# Patient Record
Sex: Male | Born: 1935
Health system: Southern US, Community
[De-identification: ages and names within clinical notes are randomized; demographics above are authoritative.]

## PROBLEM LIST (undated history)

## (undated) DIAGNOSIS — G8929 Other chronic pain: Secondary | ICD-10-CM

## (undated) DIAGNOSIS — I4891 Unspecified atrial fibrillation: Secondary | ICD-10-CM

## (undated) DIAGNOSIS — I509 Heart failure, unspecified: Secondary | ICD-10-CM

## (undated) DIAGNOSIS — E669 Obesity, unspecified: Secondary | ICD-10-CM

## (undated) DIAGNOSIS — I10 Essential (primary) hypertension: Secondary | ICD-10-CM

## (undated) DIAGNOSIS — E785 Hyperlipidemia, unspecified: Secondary | ICD-10-CM

## (undated) DIAGNOSIS — J449 Chronic obstructive pulmonary disease, unspecified: Secondary | ICD-10-CM

## (undated) HISTORY — DX: Hyperlipidemia, unspecified: E78.5

## (undated) HISTORY — PX: PERCUTANEOUS PINNING: SHX2209

## (undated) HISTORY — DX: Essential (primary) hypertension: I10

## (undated) HISTORY — DX: Obesity, unspecified: E66.9

## (undated) HISTORY — PX: FRACTURE SURGERY: SHX138

## (undated) HISTORY — DX: Chronic obstructive pulmonary disease, unspecified: J44.9

## (undated) HISTORY — DX: Other chronic pain: G89.29

---

## 1999-12-04 ENCOUNTER — Encounter: Payer: Self-pay | Admitting: Orthopedic Surgery

## 1999-12-04 ENCOUNTER — Observation Stay (HOSPITAL_COMMUNITY): Admission: AD | Admit: 1999-12-04 | Discharge: 1999-12-05 | Payer: Self-pay | Admitting: Orthopedic Surgery

## 2004-02-25 ENCOUNTER — Ambulatory Visit (HOSPITAL_COMMUNITY): Admission: RE | Admit: 2004-02-25 | Discharge: 2004-02-25 | Payer: Self-pay | Admitting: Gastroenterology

## 2008-12-24 ENCOUNTER — Ambulatory Visit: Payer: Self-pay | Admitting: Cardiology

## 2009-01-13 ENCOUNTER — Ambulatory Visit: Payer: Self-pay

## 2009-01-13 LAB — CONVERTED CEMR LAB: TSH: 4.97 microintl units/mL (ref 0.35–5.50)

## 2009-01-15 ENCOUNTER — Ambulatory Visit: Payer: Self-pay

## 2009-01-15 ENCOUNTER — Encounter: Payer: Self-pay | Admitting: Cardiology

## 2010-01-13 ENCOUNTER — Encounter: Admission: RE | Admit: 2010-01-13 | Discharge: 2010-01-13 | Payer: Self-pay | Admitting: Family Medicine

## 2011-02-23 ENCOUNTER — Encounter: Payer: Self-pay | Admitting: Cardiovascular Disease

## 2011-02-24 ENCOUNTER — Encounter: Payer: Self-pay | Admitting: Cardiovascular Disease

## 2011-02-24 ENCOUNTER — Ambulatory Visit (INDEPENDENT_AMBULATORY_CARE_PROVIDER_SITE_OTHER): Payer: Medicare Other | Admitting: Cardiovascular Disease

## 2011-02-24 DIAGNOSIS — R0602 Shortness of breath: Secondary | ICD-10-CM

## 2011-02-24 DIAGNOSIS — R079 Chest pain, unspecified: Secondary | ICD-10-CM

## 2011-02-24 DIAGNOSIS — R0600 Dyspnea, unspecified: Secondary | ICD-10-CM

## 2011-02-24 NOTE — Assessment & Plan Note (Signed)
His dyspnea may be related to his underlying lung disease however I think a cardiac workup is necessary. Echo and stress test as above.

## 2011-02-24 NOTE — Assessment & Plan Note (Addendum)
Multiple risk factors for CAD including former tobacco abuse, HTN and hyperlipidemia with dyspnea and chest pain. He is not able to exercise on the treadmill. Will arrange [facility] and an echocardiogram.

## 2011-02-24 NOTE — Progress Notes (Signed)
History of Present Illness: 77 yo WM with history of HTN, hyperlipidemia, COPD here today for cardiac follow up. He has been seen in the past by Dr. Antoine Poche with last visit in our office February 2010 at which time he was evaluated for dyspnea and palpitations. Echo February 2010 with normal LV size and function. He also has a history of premature atrial contractions.  He is added onto my schedule today for evaluation. He tells me that his breathing has gotten worse over the last few months. He describes constant fatigue. Some dizziness but no near syncope or syncope. He also describes severe sharp stabbing pain over his left chest wall. Lasts for several minutes. Radiation into his left arm at times. No associated diaphoresis or nausea. He has a history of remote tobacco abuse but does not currently smoke.   Past Medical History  Diagnosis Date  . Hypertension     x30 years  . Hyperlipidemia     x 7-8  . COPD (chronic obstructive pulmonary disease)     Past Surgical History  Procedure Date  . Percutaneous pinning     right forearm fracture    Current Outpatient Prescriptions  Medication Sig Dispense Refill  . amLODipine (NORVASC) 5 MG tablet Take 10 mg by mouth daily.       Marland Kitchen aspirin 81 MG tablet Take 81 mg by mouth daily. occasionally      . atorvastatin (LIPITOR) 20 MG tablet Take 20 mg by mouth daily.        . budesonide-formoterol (SYMBICORT) 160-4.5 MCG/ACT inhaler Inhale 2 puffs into the lungs 2 (two) times daily.        . furosemide (LASIX) 20 MG tablet Take 20 mg by mouth daily.        Marland Kitchen LORazepam (ATIVAN) 1 MG tablet Take 1 mg by mouth every 8 (eight) hours.        . metoprolol (LOPRESSOR) 50 MG tablet Take 50 mg by mouth daily.        Marland Kitchen DISCONTD: metoprolol (TOPROL-XL) 50 MG 24 hr tablet Take 50 mg by mouth daily.          No Known Allergies  History   Social History  . Marital Status: Single    Spouse Name: N/A    Number of Children: N/A  . Years of Education: N/A     Occupational History  . Not on file.   Social History Main Topics  . Smoking status: Former Smoker -- 2.5 packs/day for 25 years    Types: Cigarettes    Quit date: 02/23/1991  . Smokeless tobacco: Not on file  . Alcohol Use:   . Drug Use:   . Sexually Active:    Other Topics Concern  . Not on file   Social History Narrative  . No narrative on file    Family History  Problem Relation Age of Onset  . Cancer Mother   . Asthma Father   . Heart attack Brother     same mother.  MI in his 23s  . Coronary artery disease Other     Review of Systems:  As stated in HPI and otherwise negative.   BP 142/70  Pulse 70  Ht 5\' 8"  (1.727 m)  Wt 270 lb (122.471 kg)  BMI 41.05 kg/m2  Physical Examination: General: Well developed, well nourished, NAD HEENT: OP clear, mucus membranes moist SKIN: warm, dry. No rashes. Neuro: No focal deficits Musculoskeletal: Muscle strength 5/5 all ext Psychiatric: Mood and affect  normal Neck: No JVD, no carotid bruits, no thyromegaly, no lymphadenopathy. Lungs:Clear bilaterally, no wheezes, rhonci, crackles Cardiovascular: Regular rate and rhythm. No murmurs, gallops or rubs. Abdomen:Soft. Bowel sounds present. Non-tender.  Extremities: No lower extremity edema. Pulses are 1 + in the bilateral DP/PT.  EKG: 02/23/11: Sinus bradycardia, rate 55 bpm.

## 2011-02-24 NOTE — Patient Instructions (Addendum)
Your physician recommends that you schedule a follow-up appointment in: 2-3 weeks with Dr. Antoine Poche Your physician has requested that you have an echocardiogram. Echocardiography is a painless test that uses sound waves to create images of your heart. It provides your doctor with information about the size and shape of your heart and how well your heart's chambers and valves are working. This procedure takes approximately one hour. There are no restrictions for this procedure.  Your physician has requested that you have a lexiscan myoview. For further information please visit [email or web address] Please follow instruction sheet, as given.

## 2011-03-03 ENCOUNTER — Ambulatory Visit (HOSPITAL_BASED_OUTPATIENT_CLINIC_OR_DEPARTMENT_OTHER): Payer: Medicare Other | Admitting: Radiology

## 2011-03-03 ENCOUNTER — Ambulatory Visit (HOSPITAL_COMMUNITY): Payer: Medicare Other | Attending: Cardiology | Admitting: Radiology

## 2011-03-03 ENCOUNTER — Other Ambulatory Visit (HOSPITAL_COMMUNITY): Payer: Self-pay | Admitting: Cardiology

## 2011-03-03 DIAGNOSIS — R0600 Dyspnea, unspecified: Secondary | ICD-10-CM

## 2011-03-03 DIAGNOSIS — R0789 Other chest pain: Secondary | ICD-10-CM

## 2011-03-03 DIAGNOSIS — R072 Precordial pain: Secondary | ICD-10-CM

## 2011-03-03 DIAGNOSIS — G809 Cerebral palsy, unspecified: Secondary | ICD-10-CM

## 2011-03-03 DIAGNOSIS — R079 Chest pain, unspecified: Secondary | ICD-10-CM | POA: Insufficient documentation

## 2011-03-03 MED ORDER — TECHNETIUM TC 99M TETROFOSMIN IV KIT
33.0000 | PACK | Freq: Once | INTRAVENOUS | Status: AC | PRN
  Administered 2011-03-03: 33 via INTRAVENOUS

## 2011-03-03 MED ORDER — REGADENOSON 0.4 MG/5ML IV SOLN
0.4000 mg | Freq: Once | INTRAVENOUS | Status: AC
  Administered 2011-03-03: 0.4 mg via INTRAVENOUS

## 2011-03-03 MED ORDER — PERFLUTREN PROTEIN A MICROSPH IV SUSP
1.3000 mL | Freq: Once | INTRAVENOUS | Status: AC
  Administered 2011-03-03: 1.3 mL via INTRAVENOUS

## 2011-03-03 MED ORDER — PERFLUTREN PROTEIN A MICROSPH IV SUSP: 1.3000 mL | Freq: Once | INTRAVENOUS | Status: DC

## 2011-03-03 MED ORDER — TECHNETIUM TC 99M TETROFOSMIN IV KIT
11.0000 | PACK | Freq: Once | INTRAVENOUS | Status: AC | PRN
  Administered 2011-03-03: 11 via INTRAVENOUS

## 2011-03-03 NOTE — Progress Notes (Addendum)
[facility] SITE 3 NUCLEAR MED [address] [place] [address] [phone]  Cardiology Nuclear Med Study  Logan West is a 77 y.o. male [id number] 03/06/1934   Nuclear Med Background Indication for Stress Test:  Evaluation for Ischemia History:  '10 Echo:EF=55% Cardiac Risk Factors: Family History - CAD, History of Smoking, Hypertension, Lipids, Obesity and TIA  Symptoms:  Chest Tightness (last date of chest discomfort was about a week ago), Dizziness, DOE, Fatigue, Palpitations, Rapid HR and SOB   Nuclear Pre-Procedure Caffeine/Decaff Intake:  None NPO After: 5 pm   Lungs:  Very faint expiratory wheezes.  O2 sat 95% on RA.  Albuterol inhaler used prior to infusion. IV 0.9% NS with Angio Cath:  22g  IV Site: R Hand  IV Started by:  Bonnita Levan, RN  Chest Size (in):  50 Cup Size: n/a  Height: 5\' 8"  (1.727 m)  Weight:  268 lb (121.564 kg)  BMI:  Body mass index is 40.75 kg/(m^2). Tech Comments:  Metoprolol held this a.m.  Symbicort used at 5:00 a.m.    Nuclear Med Study 1 or 2 day study: 1 day  Stress Test Type:  Eugenie Birks  Reading MD: Marca Ancona, MD  Order Authorizing Provider:  Rollene Rotunda, MD  Resting Radionuclide: Technetium 66m Tetrofosmin  Resting Radionuclide Dose: 11 mCi   Stress Radionuclide:  Technetium 31m Tetrofosmin  Stress Radionuclide Dose: 33 mCi           Stress Protocol Rest HR: 88 Stress HR: 99  Rest BP: 154/83 Stress BP: 144/67  Exercise Time (min): n/a METS: n/a   Predicted Max HR: 146 bpm % Max HR: 67.81 bpm Rate Pressure Product: 24401   Dose of Adenosine (mg):  n/a Dose of Lexiscan: 0.4 mg  Dose of Atropine (mg): n/a Dose of Dobutamine: n/a mcg/kg/min (at max HR)  Stress Test Technologist: Rea [employer], CMA-N  Nuclear Technologist:  Domenic Polite, CNMT     Rest Procedure:  Myocardial perfusion imaging was performed at rest 45 minutes following the intravenous administration of Technetium 36m Tetrofosmin. Rest  ECG: No acute changes, isolated PVC.  Stress Procedure:  The patient received IV Lexiscan 0.4 mg over 15-seconds.  Technetium 70m Tetrofosmin injected at 30-seconds.  There were no significant changes with Lexiscan, only occasional PVC's.  Quantitative spect images were obtained after a 45 minute delay. Stress ECG: No significant change from baseline ECG  QPS Raw Data Images:  Normal; no motion artifact; normal heart/lung ratio. Stress Images:  Decreased radiotracer uptake throughout the inferior wall. Rest Images:  Decreased radiotracer uptake throughout the inferior wall, more prominent defect than with stress. Subtraction (SDS):  Fixed inferior wall perfusion defect (actually more prominent with rest than stress). Transient Ischemic Dilatation (Normal <1.22):  0.95 Lung/Heart Ratio (Normal <0.45):  0.31  Quantitative Gated Spect Images QGS EDV:  66 ml QGS ESV:  20 ml QGS cine images:  NL LV Function; NL Wall Motion QGS EF: 70%  Impression Exercise Capacity:  Lexiscan with no exercise. BP Response:  Normal blood pressure response. Clinical Symptoms:  Short of breath, nausea. ECG Impression:  No significant ST segment change suggestive of ischemia. Comparison with Prior Nuclear Study: No previous nuclear study performed  Overall Impression:  Probably normal study.  Inferior perfusion defect worse with rest than with stress (paradoxical).  Normal inferior wall motion.  Suspect diaphragmatic attenuation.  Marca Ancona  No evidence of ischemia or infarct.  Rollene Rotunda

## 2011-03-04 ENCOUNTER — Telehealth: Payer: Self-pay | Admitting: *Deleted

## 2011-03-04 NOTE — Progress Notes (Signed)
COPY ROUTED TO DR.HOCHREIN.Falecha Clark ° °

## 2011-03-04 NOTE — Telephone Encounter (Signed)
Patient notified of normal Echo per Dr. Clifton James.

## 2011-03-04 NOTE — Telephone Encounter (Signed)
Message copied by Dessie Coma on Fri Mar 04, 2011  8:47 AM ------      Message from: Verne Carrow      Created: Thu Mar 03, 2011  2:26 PM       Normal echo. Can we let him know whitney? He is pt of Hochrein. I will forward echo to Hochrein as well. Chris            MCALHANY,CHRISTOPHER      2:26 PM

## 2011-03-07 ENCOUNTER — Encounter: Payer: Self-pay | Admitting: Cardiology

## 2011-03-15 NOTE — Progress Notes (Signed)
Pt aware of results 

## 2011-03-16 ENCOUNTER — Ambulatory Visit (INDEPENDENT_AMBULATORY_CARE_PROVIDER_SITE_OTHER): Payer: Medicare Other | Admitting: Cardiology

## 2011-03-16 ENCOUNTER — Encounter: Payer: Self-pay | Admitting: Cardiology

## 2011-03-16 VITALS — BP 144/80 | HR 68 | Ht 68.0 in | Wt 271.0 lb

## 2011-03-16 DIAGNOSIS — R0609 Other forms of dyspnea: Secondary | ICD-10-CM

## 2011-03-16 DIAGNOSIS — R0989 Other specified symptoms and signs involving the circulatory and respiratory systems: Secondary | ICD-10-CM

## 2011-03-16 DIAGNOSIS — R0600 Dyspnea, unspecified: Secondary | ICD-10-CM

## 2011-03-16 DIAGNOSIS — E669 Obesity, unspecified: Secondary | ICD-10-CM

## 2011-03-16 DIAGNOSIS — I10 Essential (primary) hypertension: Secondary | ICD-10-CM

## 2011-03-16 DIAGNOSIS — F419 Anxiety disorder, unspecified: Secondary | ICD-10-CM

## 2011-03-16 DIAGNOSIS — R079 Chest pain, unspecified: Secondary | ICD-10-CM

## 2011-03-16 LAB — BRAIN NATRIURETIC PEPTIDE: Pro B Natriuretic peptide (BNP): 24 pg/mL (ref 0.0–100.0)

## 2011-03-16 NOTE — Assessment & Plan Note (Signed)
He reports this as an issue and I referred him back to his primary provider.

## 2011-03-16 NOTE — Patient Instructions (Signed)
Have blood work drawn today See Dr. Antoine Poche as needed

## 2011-03-16 NOTE — Progress Notes (Signed)
HPI The patient presents for followup of dyspnea. He was seen recently as an add-on because of this complaint. He had an extensive workup earlier this month to include an echocardiogram which showed no ventricular dysfunction or valvular abnormalities. Stress perfusion imaging did not suggest ischemia. The is not currently getting any chest discomfort. However, he still is dyspneic with mild activity is such as walking any short distance on level ground. He is not having any PND or orthopnea. Is not having any new palpitations, presyncope or syncope. He does have fatigue. He is not clear has lung disease though I see list of COPD he is on Symbicort. He says he was given oxygen to wear at night and one he thinks they were "just trying to get his money."  He is not describing PND or orthopnea.  No Known Allergies  Current Outpatient Prescriptions  Medication Sig Dispense Refill  . amLODipine (NORVASC) 5 MG tablet Take 5 mg by mouth daily.       Marland Kitchen aspirin 81 MG tablet Take 81 mg by mouth daily. occasionally      . atorvastatin (LIPITOR) 20 MG tablet Take 20 mg by mouth daily.        . budesonide-formoterol (SYMBICORT) 160-4.5 MCG/ACT inhaler Inhale 2 puffs into the lungs 2 (two) times daily.        . furosemide (LASIX) 20 MG tablet Take 20 mg by mouth daily.        Marland Kitchen LORazepam (ATIVAN) 1 MG tablet Take 1 mg by mouth every 8 (eight) hours.        . metoprolol (LOPRESSOR) 50 MG tablet Take 50 mg by mouth daily.          Past Medical History  Diagnosis Date  . Hypertension     x30 years  . Hyperlipidemia     x 7-8  . COPD (chronic obstructive pulmonary disease)     Past Surgical History  Procedure Date  . Percutaneous pinning     right forearm fracture    ROS:  As stated in the HPI and negative for all other systems.  PHYSICAL EXAM BP 144/80  Pulse 68  Ht 5\' 8"  (1.727 m)  Wt 271 lb (122.925 kg)  BMI 41.21 kg/m2 GENERAL:  Well appearing HEENT:  Pupils equal round and reactive, fundi  not visualized, oral mucosa unremarkable NECK:  No jugular venous distention, waveform within normal limits, carotid upstroke brisk and symmetric, no bruits, no thyromegaly LYMPHATICS:  No cervical, inguinal adenopathy LUNGS:  Clear to auscultation bilaterally BACK:  No CVA tenderness CHEST:  Unremarkable HEART:  PMI not displaced or sustained,S1 and S2 within normal limits, no S3, no S4, no clicks, no rubs, no murmurs ABD:  Flat, positive bowel sounds normal in frequency in pitch, no bruits, no rebound, no guarding, no midline pulsatile mass, no hepatomegaly, no splenomegaly, obese EXT:  2 plus pulses throughout, trace edema, no cyanosis no clubbing SKIN:  No rashes no nodules NEURO:  Cranial nerves II through XII grossly intact, motor grossly intact throughout PSYCH:  Cognitively intact, oriented to person place and time   ASSESSMENT AND PLAN

## 2011-03-16 NOTE — Assessment & Plan Note (Signed)
As is the most significant problem but there does not appear to be a cardiac etiology. I will check a BNP as there is a small possibility of diastolic dysfunction contributing. However, if this is normal I would not do further cardiac testing but would refer him back to his provider for consideration of pulmonary

## 2011-03-16 NOTE — Assessment & Plan Note (Signed)
He is not describing this today. No change in therapy or further evaluation is indicated.

## 2011-03-16 NOTE — Assessment & Plan Note (Signed)
The patient understands the need to lose weight with diet and exercise. We have discussed specific strategies for this.  

## 2011-03-16 NOTE — Assessment & Plan Note (Signed)
The blood pressure continues to be high. I have instructed the patient to record a blood pressure diary and recording this. This will be presented for my review and pending these results I will make further suggestions about changes in therapy for optimal blood pressure control.  

## 2011-04-05 NOTE — Assessment & Plan Note (Signed)
[facility]                            CARDIOLOGY OFFICE NOTE   BREAKER, SPRINGER                         MRN:          [id number]  DATE:12/24/2008                            DOB:          04/04/1933    PRIMARY CARE PHYSICIAN:  Helene Kelp, [facility].   REASON FOR PRESENTATION:  Evaluate the patient with atrial arrhythmias  and dyspnea.   HISTORY OF PRESENT ILLNESS:  The patient is a pleasant 77 year old  gentleman with no prior cardiac history.  He says he did have a  screening at a church one time and had normal carotid Dopplers,  abdominal aortic aneurysm, and lower extremity Dopplers.  However, he  has not had further cardiovascular testing.  He has had long standing  dyspnea.  He does have apparent COPD on chest x-ray and abnormal  pulmonary function tests.  He has had significant shortness of breath  now just walking a short distance on level ground.  This has been slowly  progressive.  He does not describing any resting shortness of breath,  PND, or orthopnea.  He does not describe any palpitations, presyncope,  or syncope.  However, he was noted to have premature atrial contractions  frequently on an EKG.  He was referred for the above problems.   The patient is limited in his activities  Again, he gets shortness of  breath with mild activities.  He does not have any significant weight  gain recently, he has no swelling.   PAST MEDICAL HISTORY:  1. Hypertension x30 years.  2. Hyperlipidemia x7-8 years.  3. Apparent COPD.   PAST SURGICAL HISTORY:  Pinning of the right forearm fracture.   ALLERGIES:  None.   MEDICATIONS:  1. Lipitor 20 mg daily.  2. Aspirin 81 mg daily.  3. Amlodipine 5 mg daily.  4. Metoprolol 50 mg daily.  5. Furosemide 20 mg daily.  6. Serevent.   SOCIAL HISTORY:  The patient is retired.  He is married.  He has 4  children.  He quit smoking about 19 years ago after 2-1/2 packs a  day  for 25 years.   FAMILY HISTORY:  Contributory for early coronary artery disease.  He  reports that his half brother via his mother had an MI in his 27s though  he is still alive.  His father died of an asthma attack at 18.  His  mother apparently died of cancer at 44.   REVIEW OF SYSTEMS:  As stated in the HPI and is positive for cough,  joint pains.  Negative for all other systems.   PHYSICAL EXAMINATION:  GENERAL:  The patient is pleasant and in no acute  distress.  VITALS:  Blood pressure 130/74, heart rate 68 and regular, weight 274  pounds, and body mass index 41.  HEENT:  Eyes unremarkable, pupils equal, round, and reactive to light,  fundi within normal limits, oral mucosa unremarkable.  NECK:  No jugular venous distension at 45 degrees, carotid upstroke  brisk and symmetric, no bruits, no thyromegaly.  LYMPHATICS:  No cervical, axillary, or  inguinal adenopathy.  LUNGS:  Clear to auscultation bilaterally.  BACK:  No costovertebral angle tenderness.  CHEST:  Unremarkable.  HEART:  PMI not displaced or sustained, S1 and S2 within normal limits,  no S3, no S4, no clicks, no rubs, no murmurs (distant heart sounds).  ABDOMEN:  Morbidly obese, positive bowel sounds, normal in frequency and  pitch, no bruits, no rebound, no guarding, no midline pulsatile mass, no  hepatomegaly, no splenomegaly.  SKIN:  No rashes, no nodules.  EXTREMITIES:  Pulses 2+ throughout, no edema, no cyanosis, or clubbing.  NEUROLOGIC:  Oriented to person, place, and time, cranial nerves II  through XII grossly intact, motor grossly intact.   EKG; sinus rhythm, premature atrial contractions in a trigeminal  pattern, low voltage in the limb leads, no acute ST-T wave changes.   ASSESSMENT AND PLAN:  1. Dyspnea.  The patient's dyspnea is a predominant complaint.  He      does have abnormal pulmonary function tests that I have reviewed.      This may all be a primary lung problem.  His exam does not  suggest      overt heart disease, but he has very distant heart sounds and I      could miss a lot on physical exam in this gentleman.  Given his      dyspnea and the palpitations, we will get an echocardiogram.  If      this is perfectly normal then I will not suggest further testing,      but continue management of this dyspnea per his primary team.  2. Palpitations.  He is not having any symptomatic tachy palpitations.      He has premature atrial contractions.  This may be related to his      lung disease.  We will get the echo as above.  I reviewed available      records.  He has had normal electrolytes.  I do not see a recent      TSH and we will ask that this be added to his labs.  3. Chronic obstructive pulmonary disease.  Per Mr. Caryn Bee, he may      benefit from a Pulmonology appointment as they do not see that he      has had one.  4. Obesity.  He understands the need to lose weight with diet and      exercise.  5. Hypertension.  His blood pressure is well-controlled on the meds as      listed and he should      continue.  6. Follow up will be based on the results of the above.     Rollene Rotunda, MD, [facility]  Electronically Signed    JH/MedQ  DD: 12/24/2008  DT: 12/25/2008  Job #: [id number]   cc:   Helene Kelp, PA

## 2011-04-08 NOTE — Op Note (Signed)
NAME:  Logan West, Logan West                            ACCOUNT NO.:  [id number]   MEDICAL RECORD NO.:  [id number]                   PATIENT TYPE:  AMB   LOCATION:  ENDO                                 FACILITY:  [facility]   PHYSICIAN:  Anselmo Rod, M.D.               DATE OF BIRTH:  04/04/1933   DATE OF PROCEDURE:  02/25/2004  DATE OF DISCHARGE:                                 OPERATIVE REPORT   PROCEDURE PERFORMED:  Screening colonoscopy.   ENDOSCOPIST:  Anselmo Rod, M.D.   INSTRUMENT USED:  Olympus videocolonoscope.   INDICATION FOR THE PROCEDURE:  A 77 year old white male undergoing screening  colonoscopy.  The patient was also found to have guaiac-positive stools on a  routine physical in the recent past, rule out colonic polyps, masses, etc.   PREPROCEDURE PREPARATION:  Informed consent was procured from the patient.  The patient was fasted for eight hours prior to the procedure and prepped  with a bottle of magnesium citrate and a gallon of GoLYTELY the night prior  to the procedure.   PREPROCEDURE PHYSICAL:  VITAL SIGNS:  Stable.  NECK:  Supple.  CHEST:  Clear to auscultation.  S1 and S2 regular.  ABDOMEN:  Soft with normal bowel sounds.   DESCRIPTION OF THE PROCEDURE:  The patient was placed in the left lateral  decubitus position and sedated with 80 mg of Demerol and 8 mg of Versed  intravenously.  Once the patient was adequately sedated and maintained on  low-flow oxygen and continuous cardiac monitoring, the Olympus  videocolonoscope was advanced from the rectum to the cecum.  The appendiceal  orifice and ileocecal valve were visualized and photographed.  There was  some residual stool in the colon.  Multiple washings were done.  A small  anal papilla was seen on retroflexion.  No masses or polyps were identified.  The appendiceal orifice and ileocecal valve were clearly visualized and  photographed.  No masses, polyps, or diverticulosis was noted.   IMPRESSION:   Normal colonoscopy to the cecum except for a small anal papilla  seen on retroflexion.   RECOMMENDATIONS:  1. Repeat guaiac testing on an outpatient basis.  2. Repeat colorectal cancer screening the next in 10 years unless the     patient develops any abnormal symptoms in the interim.  3. Outpatient followup in the next two weeks for further recommendations.                                               Anselmo Rod, M.D.    JNM/MEDQ  D:  02/25/2004  T:  02/25/2004  Job:  [id number]   cc:   Marjory Lies, M.D.  [address]  [place]  [place] [address]  Fax: [phone]

## 2011-04-08 NOTE — Op Note (Signed)
[place]  Patient:    Logan West                          MRN: [id number] Proc. Date: 12/04/99 Adm. Date:  [id number] Attending:  Milly Jakob CC:         Harvie Junior, M.D.                           Operative Report  PREOPERATIVE DIAGNOSIS:  Severely comminuted and displaced distal radius and ulnar fracture with greater than 1 cm die punch lesion.  POSTOPERATIVE DIAGNOSIS:  Severely comminuted and displaced distal radius and ulnar fracture with greater than 1 cm die punch lesion.  OPERATION:  Open reduction and internal fixation of distal radius fracture with  cancellous allograft bone placement, K-wire fixation, and external fixation placement.  SURGEON:  Harvie Junior, M.D.  ASSISTANT:  Kerby Less, P.A.  ANESTHESIA:  General.  INDICATION:  Mr. Authement is a 77 year old gentleman with a history of having fallen at work.  He suffered a severely comminuted and displaced distal radius fracture with a severely impacted die punch lesion.  Because of this, the patient was taken to the operating room for open reduction and internal fixation.  DESCRIPTION OF PROCEDURE:  The patient was brought to the operating room. After adequate anesthesia was obtained with general anesthetic, the patient was placed supine on the operating table.  The right arm was prepped and draped in the usual sterile fashion.  Following this, an external fixation device was placed and traction was placed n the extremity.  Excellent alignment was achieved of the radial styloid although it was a greater than a 1 cm die punch lesion.  Under fluoroscopy, an incision was  made to the dorsal aspect of the wrist and a drill was used to gain access in the posterior aspect of the wrist.  A large angled curet was then advanced through the drill hole and a die punch lesion was reduced anatomically.  Cancellous bone graft, allograft was  then packed posterior to the defect, and the fluoroscopy was used to show an anatomic reduction.  K-wire was then advanced through the radial styloid to insure holding of the articular cartilage in place, and once the articular cartilage was held n place with this, additional bone graft was put in place.  The styloid was then pinned longitudinally and then the final evaluation was made under fluoroscopy.  Anatomic alignment was achieved of both the impacted portion as well as of the remaining portion of the distal radius.  At this point, the wounds were copiously irrigated and suctioned dry.  The fixator was then tightened and the K-wires were cut and the dorsal wound was closed.  The retinaculum was closed as well as the  dorsal wound.  At this point, sterile compression dressing was applied.  He was then taken to he recovery room.  He was noted to be in satisfactory condition.  ESTIMATED BLOOD LOSS:  None. DD:  12/04/99 TD:  12/04/99 Job: [id number]

## 2011-05-02 ENCOUNTER — Encounter: Payer: Self-pay | Admitting: Cardiology

## 2013-02-15 ENCOUNTER — Telehealth: Payer: Self-pay | Admitting: Physician Assistant

## 2013-02-15 NOTE — Telephone Encounter (Signed)
appt made

## 2013-02-19 ENCOUNTER — Encounter: Payer: Self-pay | Admitting: Physician Assistant

## 2013-02-19 ENCOUNTER — Ambulatory Visit (INDEPENDENT_AMBULATORY_CARE_PROVIDER_SITE_OTHER): Payer: Medicare Other | Admitting: Physician Assistant

## 2013-02-19 VITALS — BP 126/74 | HR 102 | Temp 97.4°F | Ht 68.0 in | Wt 261.8 lb

## 2013-02-19 DIAGNOSIS — F411 Generalized anxiety disorder: Secondary | ICD-10-CM

## 2013-02-19 DIAGNOSIS — I10 Essential (primary) hypertension: Secondary | ICD-10-CM

## 2013-02-19 DIAGNOSIS — M549 Dorsalgia, unspecified: Secondary | ICD-10-CM

## 2013-02-19 DIAGNOSIS — E785 Hyperlipidemia, unspecified: Secondary | ICD-10-CM

## 2013-02-19 DIAGNOSIS — J449 Chronic obstructive pulmonary disease, unspecified: Secondary | ICD-10-CM

## 2013-02-19 LAB — POCT CBC
HCT, POC: 49.8 % (ref 43.5–53.7)
MCH, POC: 29.7 pg (ref 27–31.2)
MCHC: 33.5 g/dL (ref 31.8–35.4)
MCV: 88.8 fL (ref 80–97)
POC Granulocyte: 4.6 (ref 2–6.9)
RDW, POC: 14.3 %
WBC: 6.1 10*3/uL (ref 4.6–10.2)

## 2013-02-19 LAB — COMPREHENSIVE METABOLIC PANEL
ALT: 12 U/L (ref 0–53)
Albumin: 4 g/dL (ref 3.5–5.2)
CO2: 30 mEq/L (ref 19–32)
Calcium: 9.6 mg/dL (ref 8.4–10.5)
Chloride: 100 mEq/L (ref 96–112)
Creat: 1.1 mg/dL (ref 0.50–1.35)
Potassium: 4.2 mEq/L (ref 3.5–5.3)

## 2013-02-19 MED ORDER — METOPROLOL TARTRATE 50 MG PO TABS: 50.0000 mg | ORAL_TABLET | Freq: Every day | ORAL | Status: DC

## 2013-02-19 MED ORDER — PRAVASTATIN SODIUM 40 MG PO TABS: 40.0000 mg | ORAL_TABLET | Freq: Every day | ORAL | Status: DC

## 2013-02-19 MED ORDER — BUDESONIDE-FORMOTEROL FUMARATE 160-4.5 MCG/ACT IN AERO: 2.0000 | INHALATION_SPRAY | Freq: Two times a day (BID) | RESPIRATORY_TRACT | Status: DC

## 2013-02-19 MED ORDER — LORAZEPAM 1 MG PO TABS: 1.0000 mg | ORAL_TABLET | Freq: Three times a day (TID) | ORAL | Status: DC

## 2013-02-19 MED ORDER — FUROSEMIDE 20 MG PO TABS: 20.0000 mg | ORAL_TABLET | Freq: Every day | ORAL | Status: DC

## 2013-02-19 MED ORDER — AMLODIPINE BESYLATE 10 MG PO TABS: 10.0000 mg | ORAL_TABLET | Freq: Every day | ORAL | Status: DC

## 2013-02-19 MED ORDER — TRAMADOL HCL 50 MG PO TABS: 50.0000 mg | ORAL_TABLET | Freq: Three times a day (TID) | ORAL | Status: DC | PRN

## 2013-02-20 LAB — NMR LIPOPROFILE WITH LIPIDS
Cholesterol, Total: 148 mg/dL (ref <200)
HDL Particle Number: 24.9 umol/L — ABNORMAL LOW (ref >=30.5)
LP-IR Score: 50 — ABNORMAL HIGH (ref <=45)
Large VLDL-P: 1.3 nmol/L (ref <=2.7)
Small LDL Particle Number: 771 nmol/L — ABNORMAL HIGH (ref <=527)

## 2013-02-20 NOTE — Progress Notes (Signed)
  Subjective:    Patient ID: Logan West, male    DOB: 03-16-1934, 79 y.o.   MRN: [id number]  HPI meds and labs to be updated     Review of Systems  Respiratory: Positive for cough.   Musculoskeletal: Positive for back pain.  All other systems reviewed and are negative.       Objective:   Physical Exam  Vitals reviewed. Constitutional: He is oriented to person, place, and time. He appears well-developed and well-nourished.  HENT:  Head: Normocephalic and atraumatic.  Right Ear: External ear normal.  Left Ear: External ear normal.  Nose: Nose normal.  Mouth/Throat: Oropharynx is clear and moist.  Amber colored waxy cerumen partially occluding the ear canals  Eyes: Conjunctivae and EOM are normal.  Neck: Normal range of motion. Neck supple.  Cardiovascular: Regular rhythm and normal heart sounds.   HR 102 bpm  Pulmonary/Chest: Effort normal. He has no wheezes. He has no rales.  Decreased breath sounds  Abdominal: Soft. Bowel sounds are normal. There is no tenderness. There is no rebound.  Musculoskeletal:  Paravertebral muscle spasm  Lumbar tenderness Ambulates with cane  Neurological: He is alert and oriented to person, place, and time.  Skin: Skin is warm and dry.  Psychiatric: He has a normal mood and affect. His behavior is normal. Judgment and thought content normal.          Assessment & Plan:  Other and unspecified hyperlipidemia - Plan: NMR Lipoprofile with Lipids, Comprehensive metabolic panel, POCT CBC, amLODipine (NORVASC) 10 MG tablet, budesonide-formoterol (SYMBICORT) 160-4.5 MCG/ACT inhaler, furosemide (LASIX) 20 MG tablet, LORazepam (ATIVAN) 1 MG tablet, metoprolol (LOPRESSOR) 50 MG tablet, pravastatin (PRAVACHOL) 40 MG tablet, traMADol (ULTRAM) 50 MG tablet  HTN (hypertension) - Plan: Comprehensive metabolic panel, POCT CBC, amLODipine (NORVASC) 10 MG tablet, budesonide-formoterol (SYMBICORT) 160-4.5 MCG/ACT inhaler, furosemide (LASIX) 20 MG tablet,  LORazepam (ATIVAN) 1 MG tablet, metoprolol (LOPRESSOR) 50 MG tablet, pravastatin (PRAVACHOL) 40 MG tablet, traMADol (ULTRAM) 50 MG tablet  Back pain  Anxiety state, unspecified  Chronic obstructive asthma, unspecified  Mild tachycardia - monitor and address at next office visit

## 2013-02-25 ENCOUNTER — Telehealth: Payer: Self-pay | Admitting: *Deleted

## 2013-02-25 NOTE — Telephone Encounter (Signed)
Pt will diet and exercise and continue with cholesterol meds.

## 2013-02-25 NOTE — Telephone Encounter (Signed)
Message copied by Almeta Monas on Mon Feb 25, 2013  2:49 PM ------      Message from: Horald Pollen      Created: Fri Feb 22, 2013  8:57 AM       Yes, stay on the same ------

## 2013-03-13 ENCOUNTER — Encounter: Payer: Self-pay | Admitting: *Deleted

## 2013-03-18 ENCOUNTER — Telehealth: Payer: Self-pay | Admitting: Family Medicine

## 2013-03-19 ENCOUNTER — Other Ambulatory Visit: Payer: Self-pay | Admitting: Family Medicine

## 2013-03-19 DIAGNOSIS — E785 Hyperlipidemia, unspecified: Secondary | ICD-10-CM

## 2013-03-19 DIAGNOSIS — I10 Essential (primary) hypertension: Secondary | ICD-10-CM

## 2013-03-19 MED ORDER — TRAMADOL HCL 50 MG PO TABS: 50.0000 mg | ORAL_TABLET | Freq: Three times a day (TID) | ORAL | Status: DC | PRN

## 2013-03-19 NOTE — Telephone Encounter (Signed)
Rx refilled for #90 in [facility].

## 2013-03-28 ENCOUNTER — Other Ambulatory Visit: Payer: Self-pay | Admitting: Family Medicine

## 2013-04-26 ENCOUNTER — Other Ambulatory Visit: Payer: Self-pay | Admitting: Family Medicine

## 2013-04-29 NOTE — Telephone Encounter (Signed)
LAST OV 02/19/13. LAST RF 03/28/13. PLEASE PRINT AND CALL PT IF APPROVED. THANKS.

## 2013-04-30 NOTE — Telephone Encounter (Signed)
Spoke with wife and will sch appt with pharmascist

## 2013-04-30 NOTE — Telephone Encounter (Signed)
Needs office visit for pain management review with Tammy or Marcelino Duster. Last refill until reviewed in pain management. Need to taper down and use other modalities.

## 2013-05-01 ENCOUNTER — Telehealth: Payer: Self-pay | Admitting: Pharmacist

## 2013-05-21 ENCOUNTER — Ambulatory Visit: Payer: Medicare Other | Admitting: Family Medicine

## 2013-05-21 ENCOUNTER — Ambulatory Visit (INDEPENDENT_AMBULATORY_CARE_PROVIDER_SITE_OTHER): Payer: Medicare Other | Admitting: Pharmacist Clinician (PhC)/ Clinical Pharmacy Specialist

## 2013-05-21 DIAGNOSIS — M25529 Pain in unspecified elbow: Secondary | ICD-10-CM

## 2013-05-21 NOTE — Progress Notes (Signed)
  Subjective:    Patient ID: Mccrae Speciale, male    DOB: 04/11/1934, 79 y.o.   MRN: [id number]  HPI:  12 years ago fell on ice and broke arm and wrist and then 3-4 years ago fell and broke other arm/wrist on the job.  He has had considerable pain since both injuries    Review of Systems  Constitutional: Negative.   HENT: Negative.   Eyes: Negative.   Respiratory: Negative.   Cardiovascular: Negative.   Neurological: Negative.   Psychiatric/Behavioral: Negative.        Objective:   Physical Exam  Constitutional: He is oriented to person, place, and time. He appears well-developed and well-nourished.  Cardiovascular: Normal rate and regular rhythm.   Neurological: He is alert and oriented to person, place, and time.  Psychiatric: He has a normal mood and affect. His behavior is normal. Judgment and thought content normal.          Assessment & Plan:   Subjective:    Shashank Kwasnik is a 79 y.o. male who presents for evaluation of pain. Records reviewed, and patient examined. Nature of the pain: in both arms and wrists see HPI above Location of the pain: arm/wrist Intensity: 7 out of 10 with ultram Exacerbating/relieving factors: rest and elevation Adverse effects of medications: none     Review of Systems Pertinent items are noted in HPI.    Objective:    No exam performed today, out of scope of practice. Radiographs: none    Assessment:    Assessment: Degenerative disc disease (DDD):  Marland Kitchen    Plan:     1.  Patient wants to continue with ultram:  1 tablet in the morning and 2 at bedtime.  He is able to tolerate pain with this medication.  I asked him to take and ES tylenol with the ultram to see if this boosts is pain relief and also to try ibuprofen after eating to see if it relieves his pain.  He had not trying either of these OTC analgesics.    2.  Patient signed pain management contract after I reviewed it with him.  3.  He is not having any constipation or  nausea with tramadol and no sedative properties that are effecting him.

## 2013-05-28 ENCOUNTER — Encounter: Payer: Self-pay | Admitting: Family Medicine

## 2013-05-28 ENCOUNTER — Ambulatory Visit (INDEPENDENT_AMBULATORY_CARE_PROVIDER_SITE_OTHER): Payer: Medicare Other | Admitting: Family Medicine

## 2013-05-28 VITALS — BP 105/70 | HR 68 | Temp 97.2°F | Wt 265.0 lb

## 2013-05-28 DIAGNOSIS — R0609 Other forms of dyspnea: Secondary | ICD-10-CM

## 2013-05-28 DIAGNOSIS — I10 Essential (primary) hypertension: Secondary | ICD-10-CM

## 2013-05-28 DIAGNOSIS — R0989 Other specified symptoms and signs involving the circulatory and respiratory systems: Secondary | ICD-10-CM

## 2013-05-28 DIAGNOSIS — R079 Chest pain, unspecified: Secondary | ICD-10-CM

## 2013-05-28 DIAGNOSIS — R0600 Dyspnea, unspecified: Secondary | ICD-10-CM

## 2013-05-28 DIAGNOSIS — R635 Abnormal weight gain: Secondary | ICD-10-CM

## 2013-05-28 DIAGNOSIS — F411 Generalized anxiety disorder: Secondary | ICD-10-CM

## 2013-05-28 DIAGNOSIS — G894 Chronic pain syndrome: Secondary | ICD-10-CM

## 2013-05-28 DIAGNOSIS — E669 Obesity, unspecified: Secondary | ICD-10-CM

## 2013-05-28 DIAGNOSIS — E785 Hyperlipidemia, unspecified: Secondary | ICD-10-CM | POA: Insufficient documentation

## 2013-05-28 DIAGNOSIS — F419 Anxiety disorder, unspecified: Secondary | ICD-10-CM

## 2013-05-28 LAB — COMPLETE METABOLIC PANEL WITH GFR
ALT: 12 U/L (ref 0–53)
AST: 14 U/L (ref 0–37)
Albumin: 4 g/dL (ref 3.5–5.2)
Alkaline Phosphatase: 85 U/L (ref 39–117)
BUN: 13 mg/dL (ref 6–23)
CO2: 31 mEq/L (ref 19–32)
Calcium: 9.6 mg/dL (ref 8.4–10.5)
Chloride: 101 mEq/L (ref 96–112)
Creat: 1.29 mg/dL (ref 0.50–1.35)
GFR, Est African American: 61 mL/min
GFR, Est Non African American: 53 mL/min — ABNORMAL LOW
Glucose, Bld: 88 mg/dL (ref 70–99)
Potassium: 4.9 mEq/L (ref 3.5–5.3)
Sodium: 140 mEq/L (ref 135–145)
Total Bilirubin: 1 mg/dL (ref 0.3–1.2)
Total Protein: 7.4 g/dL (ref 6.0–8.3)

## 2013-05-28 MED ORDER — METOPROLOL TARTRATE 50 MG PO TABS: 50.0000 mg | ORAL_TABLET | Freq: Every day | ORAL | Status: DC

## 2013-05-28 MED ORDER — BUDESONIDE-FORMOTEROL FUMARATE 160-4.5 MCG/ACT IN AERO: 2.0000 | INHALATION_SPRAY | Freq: Two times a day (BID) | RESPIRATORY_TRACT | Status: DC

## 2013-05-28 MED ORDER — TRAMADOL HCL 50 MG PO TABS: ORAL_TABLET | ORAL | Status: DC

## 2013-05-28 MED ORDER — LORAZEPAM 1 MG PO TABS: 1.0000 mg | ORAL_TABLET | Freq: Three times a day (TID) | ORAL | Status: DC

## 2013-05-28 MED ORDER — AMLODIPINE BESYLATE 10 MG PO TABS: 10.0000 mg | ORAL_TABLET | Freq: Every day | ORAL | Status: DC

## 2013-05-28 MED ORDER — PRAVASTATIN SODIUM 40 MG PO TABS: 40.0000 mg | ORAL_TABLET | Freq: Every day | ORAL | Status: DC

## 2013-05-28 MED ORDER — FUROSEMIDE 20 MG PO TABS: 20.0000 mg | ORAL_TABLET | Freq: Every day | ORAL | Status: DC

## 2013-05-28 NOTE — Progress Notes (Signed)
Patient ID: Logan West, male   DOB: Oct 21, 1934, 79 y.o.   MRN: [id number] SUBJECTIVE: CC: Chief Complaint  Patient presents with  . Follow-up    3 month folloqw up  fasting     HPI: Patient is here for follow up of hypertension/copd/obesitychronic pain of the right wrist and  forearm: denies Headache;deniesChest Pain;denies weakness;denies Shortness of Breath or Orthopnea;denies Visual changes;denies palpitations;denies cough;denies pedal edema;denies symptoms of TIA or stroke; admits to Compliance with medications. denies Problems with medications.  doing well.  Breakfast: grits and  Eggs Lunch: pork chops and  Steak,bread/ biscuit,green beans, mashed potatoes Supper: same as lunch   Past Medical History  Diagnosis Date  . Hypertension     x30 years  . Hyperlipidemia     x 7-8  . COPD (chronic obstructive pulmonary disease)    Past Surgical History  Procedure Laterality Date  . Percutaneous pinning      right forearm fracture   History   Social History  . Marital Status: Single    Spouse Name: N/A    Number of Children: N/A  . Years of Education: N/A   Occupational History  . Not on file.   Social History Main Topics  . Smoking status: Former Smoker -- 2.50 packs/day for 25 years    Types: Cigarettes    Quit date: 02/23/1991  . Smokeless tobacco: Not on file  . Alcohol Use:   . Drug Use:   . Sexually Active:    Other Topics Concern  . Not on file   Social History Narrative  . No narrative on file   Family History  Problem Relation Age of Onset  . Cancer Mother   . Asthma Father   . Heart attack Brother     same mother.  MI in his 76s  . Coronary artery disease Other    Current Outpatient Prescriptions on File Prior to Visit  Medication Sig Dispense Refill  . amLODipine (NORVASC) 10 MG tablet Take 1 tablet (10 mg total) by mouth daily.  30 tablet  5  . aspirin 81 MG tablet Take 81 mg by mouth daily. occasionally      . atorvastatin (LIPITOR) 20  MG tablet Take 20 mg by mouth daily.        . budesonide-formoterol (SYMBICORT) 160-4.5 MCG/ACT inhaler Inhale 2 puffs into the lungs 2 (two) times daily.  1 Inhaler  5  . furosemide (LASIX) 20 MG tablet Take 1 tablet (20 mg total) by mouth daily.  30 tablet  5  . LORazepam (ATIVAN) 1 MG tablet Take 1 tablet (1 mg total) by mouth every 8 (eight) hours.  30 tablet  5  . metoprolol (LOPRESSOR) 50 MG tablet Take 1 tablet (50 mg total) by mouth daily.  30 tablet  5  . pravastatin (PRAVACHOL) 40 MG tablet Take 1 tablet (40 mg total) by mouth daily.  30 tablet  5  . traMADol (ULTRAM) 50 MG tablet TAKE 1 TABLET EVERY 8 (EIGHT) HOURS AS NEEDED FOR PAIN ,1 IN MORNING AND 2 AT BEDTIME  90 tablet  0   No current facility-administered medications on file prior to visit.   No Known Allergies  There is no immunization history on file for this patient. Prior to Admission medications   Medication Sig Start Date End Date Taking? Authorizing Provider  amLODipine (NORVASC) 10 MG tablet Take 1 tablet (10 mg total) by mouth daily. 02/19/13  Yes Horald Pollen, PA-C  aspirin 81 MG tablet Take  81 mg by mouth daily. occasionally   Yes Historical Provider, MD  atorvastatin (LIPITOR) 20 MG tablet Take 20 mg by mouth daily.     Yes Historical Provider, MD  budesonide-formoterol (SYMBICORT) 160-4.5 MCG/ACT inhaler Inhale 2 puffs into the lungs 2 (two) times daily. 02/19/13  Yes Horald Pollen, PA-C  furosemide (LASIX) 20 MG tablet Take 1 tablet (20 mg total) by mouth daily. 02/19/13  Yes Horald Pollen, PA-C  LORazepam (ATIVAN) 1 MG tablet Take 1 tablet (1 mg total) by mouth every 8 (eight) hours. 02/19/13  Yes Horald Pollen, PA-C  metoprolol (LOPRESSOR) 50 MG tablet Take 1 tablet (50 mg total) by mouth daily. 02/19/13  Yes Horald Pollen, PA-C  pravastatin (PRAVACHOL) 40 MG tablet Take 1 tablet (40 mg total) by mouth daily. 02/19/13  Yes Horald Pollen, PA-C  traMADol (ULTRAM) 50 MG tablet TAKE 1 TABLET EVERY 8 (EIGHT) HOURS AS  NEEDED FOR PAIN ,1 IN MORNING AND 2 AT BEDTIME 04/26/13  Yes Ileana Ladd, MD     ROS: As above in the HPI. All other systems are stable or negative.  OBJECTIVE: APPEARANCE:  Patient in no acute distress.The patient appeared well nourished and normally developed. Acyanotic. Waist:51 inches VITAL SIGNS:BP 105/70  Pulse 68  Temp(Src) 97.2 F (36.2 C) (Oral)  Wt 265 lb (120.203 kg)  BMI 40.3 kg/m2 WM morbidly obese.  SKIN: warm and  Dry without overt rashes, tattoos and scars  HEAD and Neck: without JVD, Head and scalp: normal Eyes:No scleral icterus. Fundi normal, eye movements normal. Ears: Auricle normal, canal normal, Tympanic membranes normal, insufflation normal. Nose: normal Throat: normal Neck & thyroid: normal  CHEST & LUNGS: Chest wall: normal Lungs: Clear  [facility]: Reveals the PMI to be normally located. Regular rhythm, First and Second Heart sounds are normal,  absence of murmurs, rubs or gallops. Peripheral vasculature: Radial pulses: normal Dorsal pedis pulses: normal Posterior pulses: normal  ABDOMEN:  Appearance: normal Benign, no organomegaly, no masses, no Abdominal Aortic enlargement. No Guarding , no rebound. No Bruits. Bowel sounds: normal  RECTAL: N/A GU: N/A  EXTREMETIES: nonedematous. Both Femoral and Pedal pulses are normal.  MUSCULOSKELETAL:  Spine: normal Joints: intact  NEUROLOGIC: oriented to time,place and person; nonfocal. Strength is normal Sensory is normal Reflexes are normal Cranial Nerves are normal.  ASSESSMENT: Obesity  HTN (hypertension) - Plan: amLODipine (NORVASC) 10 MG tablet, furosemide (LASIX) 20 MG tablet, budesonide-formoterol (SYMBICORT) 160-4.5 MCG/ACT inhaler, metoprolol (LOPRESSOR) 50 MG tablet, pravastatin (PRAVACHOL) 40 MG tablet, LORazepam (ATIVAN) 1 MG tablet, COMPLETE METABOLIC PANEL WITH GFR  HLD (hyperlipidemia) - Plan: COMPLETE METABOLIC PANEL WITH GFR, NMR Lipoprofile with  Lipids  Dyspnea  Chest pain  Anxiety  Chronic pain syndrome - Plan: traMADol (ULTRAM) 50 MG tablet  Other and unspecified hyperlipidemia - Plan: amLODipine (NORVASC) 10 MG tablet, furosemide (LASIX) 20 MG tablet, budesonide-formoterol (SYMBICORT) 160-4.5 MCG/ACT inhaler, metoprolol (LOPRESSOR) 50 MG tablet, pravastatin (PRAVACHOL) 40 MG tablet, LORazepam (ATIVAN) 1 MG tablet  Major problem is his severe obesity. We discussed ways to reduce this  Risk factor.  PLAN: Encourage to increase vegetables and fruits in diet. He doesn't like fruits and veggies but the health benefits were discussed. His high fat diet and high animal product intake was discussed and the health impact was discussed.      Dr Woodroe Mode Recommendations  Diet and Exercise discussed with patient.  For nutrition information, I recommend books:  1).Eat to Live by Dr Monico Hoar.  2).Prevent and Reverse Heart Disease by Dr Suzzette Righter. 3) Dr Katherina Right Book:  Program to Reverse Diabetes  Exercise recommendations are:  If unable to walk, then the patient can exercise in a chair 3 times a day. By flapping arms like a bird gently and raising legs outwards to the front.  If ambulatory, the patient can go for walks for 30 minutes 3 times a week. Then increase the intensity and duration as tolerated.  Goal is to try to attain exercise frequency to 5 times a week.  If applicable: Best to perform resistance exercises (machines or weights) 2 days a week and cardio type exercises 3 days per week.  Orders Placed This Encounter  Procedures  . COMPLETE METABOLIC PANEL WITH GFR  . NMR Lipoprofile with Lipids   Meds ordered this encounter  Medications  . amLODipine (NORVASC) 10 MG tablet    Sig: Take 1 tablet (10 mg total) by mouth daily.    Dispense:  30 tablet    Refill:  11    Order Specific Question:  Supervising Provider    Answer:  Ernestina Penna [[id number]]  . furosemide (LASIX) 20 MG tablet     Sig: Take 1 tablet (20 mg total) by mouth daily.    Dispense:  30 tablet    Refill:  11    Order Specific Question:  Supervising Provider    Answer:  Ernestina Penna [[id number]]  . budesonide-formoterol (SYMBICORT) 160-4.5 MCG/ACT inhaler    Sig: Inhale 2 puffs into the lungs 2 (two) times daily.    Dispense:  1 Inhaler    Refill:  5    Order Specific Question:  Supervising Provider    Answer:  Ernestina Penna [[id number]]  . metoprolol (LOPRESSOR) 50 MG tablet    Sig: Take 1 tablet (50 mg total) by mouth daily.    Dispense:  30 tablet    Refill:  11    Order Specific Question:  Supervising Provider    Answer:  Ernestina Penna [[id number]]  . pravastatin (PRAVACHOL) 40 MG tablet    Sig: Take 1 tablet (40 mg total) by mouth daily.    Dispense:  30 tablet    Refill:  11    Order Specific Question:  Supervising Provider    Answer:  Ernestina Penna [[id number]]  . LORazepam (ATIVAN) 1 MG tablet    Sig: Take 1 tablet (1 mg total) by mouth every 8 (eight) hours.    Dispense:  30 tablet    Refill:  5    Order Specific Question:  Supervising Provider    Answer:  Ernestina Penna [[id number]]  . traMADol (ULTRAM) 50 MG tablet    Sig: Take 1 in the morning and 2 at night prn pain    Dispense:  90 tablet    Refill:  1   Return in about 4 months (around 09/28/2013) for Recheck medical problems.  Serge Main P. Modesto Charon, M.D.

## 2013-05-28 NOTE — Patient Instructions (Signed)
      Dr Almus Woodham's Recommendations  Diet and Exercise discussed with patient.  For nutrition information, I recommend books:  1).Eat to Live by Dr Joel Fuhrman. 2).Prevent and Reverse Heart Disease by Dr Caldwell Esselstyn. 3) Dr Neal Barnard's Book:  Program to Reverse Diabetes  Exercise recommendations are:  If unable to walk, then the patient can exercise in a chair 3 times a day. By flapping arms like a bird gently and raising legs outwards to the front.  If ambulatory, the patient can go for walks for 30 minutes 3 times a week. Then increase the intensity and duration as tolerated.  Goal is to try to attain exercise frequency to 5 times a week.  If applicable: Best to perform resistance exercises (machines or weights) 2 days a week and cardio type exercises 3 days per week.  

## 2013-05-29 LAB — NMR LIPOPROFILE WITH LIPIDS
Cholesterol, Total: 142 mg/dL (ref <200)
HDL Particle Number: 27.5 umol/L — ABNORMAL LOW (ref >=30.5)
HDL Size: 8.8 nm — ABNORMAL LOW (ref >=9.2)
HDL-C: 38 mg/dL — ABNORMAL LOW (ref >=40)
LDL (calc): 85 mg/dL (ref <100)
LDL Particle Number: 1106 nmol/L — ABNORMAL HIGH (ref <1000)
LDL Size: 21.1 nm (ref >20.5)
LP-IR Score: 41 (ref <=45)
Large HDL-P: 4 umol/L — ABNORMAL LOW (ref >=4.8)
Large VLDL-P: 1.7 nmol/L (ref <=2.7)
Small LDL Particle Number: 412 nmol/L (ref <=527)
Triglycerides: 93 mg/dL (ref <150)
VLDL Size: 40.1 nm (ref <=46.6)

## 2013-06-02 NOTE — Progress Notes (Signed)
Quick Note:  Lab result at goal. No change in Medications for now. No Change in plans and follow up. weight loss would improve his health ______

## 2013-07-24 ENCOUNTER — Other Ambulatory Visit: Payer: Self-pay | Admitting: Family Medicine

## 2013-07-25 NOTE — Telephone Encounter (Signed)
Last seen Dr Modesto Charon 05/28/13   If approved print and route to nurse

## 2013-07-26 NOTE — Telephone Encounter (Signed)
Pt awrwe rx tramadol ready to pick up

## 2013-07-26 NOTE — Telephone Encounter (Signed)
Last filled 06/28/13, last seen 05/28/13. Rx will print, have nurse call pt to pickup

## 2013-09-22 ENCOUNTER — Other Ambulatory Visit: Payer: Self-pay | Admitting: Family Medicine

## 2013-09-24 ENCOUNTER — Other Ambulatory Visit: Payer: Self-pay | Admitting: Family Medicine

## 2013-09-24 ENCOUNTER — Telehealth: Payer: Self-pay | Admitting: Family Medicine

## 2013-09-24 MED ORDER — TRAMADOL HCL 50 MG PO TABS: ORAL_TABLET | ORAL | Status: DC

## 2013-09-24 NOTE — Telephone Encounter (Signed)
Rx ready for pick up.was refilled earlier

## 2013-09-24 NOTE — Telephone Encounter (Signed)
PT NOTIFIED RX AT FRONT TO PICKUP.

## 2013-09-24 NOTE — Telephone Encounter (Signed)
Rx ready for pick up. 

## 2013-09-24 NOTE — Telephone Encounter (Signed)
Last seen 05/28/13  [facility]  IF approved print and route to nurse

## 2013-10-01 ENCOUNTER — Encounter: Payer: Self-pay | Admitting: Family Medicine

## 2013-10-01 ENCOUNTER — Ambulatory Visit: Payer: Medicare Other | Admitting: Family Medicine

## 2013-10-01 ENCOUNTER — Ambulatory Visit (INDEPENDENT_AMBULATORY_CARE_PROVIDER_SITE_OTHER): Payer: Medicare Other | Admitting: Family Medicine

## 2013-10-01 VITALS — BP 106/71 | HR 67 | Temp 97.4°F | Ht 68.5 in | Wt 263.6 lb

## 2013-10-01 DIAGNOSIS — R350 Frequency of micturition: Secondary | ICD-10-CM

## 2013-10-01 DIAGNOSIS — M1990 Unspecified osteoarthritis, unspecified site: Secondary | ICD-10-CM

## 2013-10-01 DIAGNOSIS — M129 Arthropathy, unspecified: Secondary | ICD-10-CM

## 2013-10-01 DIAGNOSIS — F411 Generalized anxiety disorder: Secondary | ICD-10-CM

## 2013-10-01 DIAGNOSIS — E785 Hyperlipidemia, unspecified: Secondary | ICD-10-CM

## 2013-10-01 DIAGNOSIS — I10 Essential (primary) hypertension: Secondary | ICD-10-CM

## 2013-10-01 LAB — POCT UA - MICROSCOPIC ONLY
Bacteria, U Microscopic: NEGATIVE
Casts, Ur, LPF, POC: NEGATIVE
Crystals, Ur, HPF, POC: NEGATIVE
Mucus, UA: NEGATIVE
RBC, urine, microscopic: NEGATIVE
WBC, Ur, HPF, POC: NEGATIVE
Yeast, UA: NEGATIVE

## 2013-10-01 LAB — POCT CBC
Granulocyte percent: 76.9 %G (ref 37–80)
HCT, POC: 52.1 % (ref 43.5–53.7)
Hemoglobin: 16.7 g/dL (ref 14.1–18.1)
Lymph, poc: 1 (ref 0.6–3.4)
MCH, POC: 28.8 pg (ref 27–31.2)
MCHC: 32.1 g/dL (ref 31.8–35.4)
MCV: 89.7 fL (ref 80–97)
MPV: 7.3 fL (ref 0–99.8)
POC Granulocyte: 4.2 (ref 2–6.9)
POC LYMPH PERCENT: 18.8 %L (ref 10–50)
Platelet Count, POC: 207 10*3/uL (ref 142–424)
RBC: 5.8 M/uL (ref 4.69–6.13)
RDW, POC: 14.6 %
WBC: 5.4 10*3/uL (ref 4.6–10.2)

## 2013-10-01 LAB — POCT URINALYSIS DIPSTICK
Bilirubin, UA: NEGATIVE
Blood, UA: NEGATIVE
Glucose, UA: NEGATIVE
Ketones, UA: NEGATIVE
Leukocytes, UA: NEGATIVE
Nitrite, UA: NEGATIVE
Protein, UA: NEGATIVE
Spec Grav, UA: <=1.005
Urobilinogen, UA: NEGATIVE
pH, UA: 6

## 2013-10-01 MED ORDER — LORAZEPAM 1 MG PO TABS: 1.0000 mg | ORAL_TABLET | Freq: Three times a day (TID) | ORAL | Status: DC

## 2013-10-01 MED ORDER — TRAMADOL HCL 50 MG PO TABS: ORAL_TABLET | ORAL | Status: DC

## 2013-10-01 NOTE — Progress Notes (Signed)
  Subjective:    Patient ID: Logan West, male    DOB: Oct 09, 1934, 79 y.o.   MRN: [id number]  HPI  This 79 y.o. male presents for evaluation of routine follow up.  He needs refills On his ativan.  He c/o anxiety and usually takes one in am and one in pm. He has hx of hypertension and takes lasix.  He is c/o urinary frequency.  Review of Systems C/o urinary frequency and anxiety.   No chest pain, SOB, HA, dizziness, vision change, N/V, diarrhea, constipation, dysuria, urinary urgency or frequency, myalgias, arthralgias or rash.  Objective:   Physical Exam  Vital signs noted  Well developed well nourished pbese male.  HEENT - Head atraumatic Normocephalic                Eyes - PERRLA, Conjuctiva - clear Sclera- Clear EOMI                Ears - EAC's Wnl TM's Wnl Gross Hearing WNL                Nose - Nares patent                 Throat - oropharanx wnl Respiratory - Lungs CTA bilateral Cardiac - RRR S1 and S2 without murmur GI - Abdomen soft Nontender and bowel sounds active x 4 Extremities - No edema. Neuro - Grossly intact.      Assessment & Plan:  Other and unspecified hyperlipidemia - Plan: POCT CBC, CMP14+EGFR  HTN (hypertension) - Plan: POCT CBC, CMP14+EGFR  Anxiety state, unspecified - Plan: LORazepam (ATIVAN) 1 MG tablet  Arthritis - Plan: traMADol (ULTRAM) 50 MG tablet  Urinary frequency - Plan: POCT UA - Microscopic Only, POCT urinalysis dipstick  Deatra Canter FNP

## 2013-10-01 NOTE — Progress Notes (Signed)
BP 106/71  Pulse 67  Temp(Src) 97.4 F (36.3 C) (Oral)  Ht 5' 8.5" (1.74 m)  Wt 263 lb 9.6 oz (119.568 kg)  BMI 39.49 kg/m2

## 2013-10-01 NOTE — Patient Instructions (Signed)
Hipertensión  (Hypertension)  La hipertensión es otro nombre para la presión arterial elevada. La hipertensión arterial puede significar que el músculo cardíaco está trabajando más de lo normal para bombear la sangre.  CUIDADOS EN EL HOGAR  · Es posible que sea necesario realizar cambios en su estilo de vida. Esto incluye bajar de peso y ejercitarse.  · Tome los medicamentos para la presión arterial todos los días.  · Limite el consumo de sal.  · Deje de fumar.  · No consuma drogas ilegales.  · Hable con el médico si toma descongestionantes o pastillas anticonceptivas. Estos medicamentos pueden hacer que la presión sanguínea aumente.  · Las mujeres no deben beber más de una bebida alcohólica por día. Los hombres no deben beber más de dos bebidas alcohólicas por día.  · Vea a su médico según le hayan indicado.  SOLICITE AYUDA DE INMEDIATO SI:  · Usted tiene una presión de 180 o mayor.  · Sufre una cefalea grave.  · Tiene visión borrosa o cambiante.  · Se siente confundido.  · Se siente débil, adormecido, o desfalleciente.  · Siente dolor en el pecho o en el abdomen.  · Tiene vómitos.  · No puede respirar bien.  ASEGÚRESE QUE:  · Comprende estas instrucciones.  · Controlará su enfermedad.  · Solicitará ayuda de inmediato si no mejora o empeora.  Document Released: 04/27/2010 Document Revised: 01/30/2012  Exit[employer]® Patient Information ©2014 [employer].

## 2013-10-02 LAB — CMP14+EGFR
ALT: 16 IU/L (ref 0–44)
AST: 17 IU/L (ref 0–40)
Albumin/Globulin Ratio: 1.6 (ref 1.1–2.5)
Albumin: 3.9 g/dL (ref 3.5–4.8)
Alkaline Phosphatase: 86 IU/L (ref 39–117)
BUN/Creatinine Ratio: 8 — ABNORMAL LOW (ref 10–22)
BUN: 9 mg/dL (ref 8–27)
CO2: 31 mmol/L — ABNORMAL HIGH (ref 18–29)
Calcium: 9.3 mg/dL (ref 8.6–10.2)
Chloride: 96 mmol/L — ABNORMAL LOW (ref 97–108)
Creatinine, Ser: 1.14 mg/dL (ref 0.76–1.27)
GFR calc Af Amer: 71 mL/min/{1.73_m2} (ref >59)
GFR calc non Af Amer: 62 mL/min/{1.73_m2} (ref >59)
Globulin, Total: 2.5 g/dL (ref 1.5–4.5)
Glucose: 101 mg/dL — ABNORMAL HIGH (ref 65–99)
Potassium: 4.2 mmol/L (ref 3.5–5.2)
Sodium: 140 mmol/L (ref 134–144)
Total Bilirubin: 0.9 mg/dL (ref 0.0–1.2)
Total Protein: 6.4 g/dL (ref 6.0–8.5)

## 2014-01-18 ENCOUNTER — Other Ambulatory Visit: Payer: Self-pay | Admitting: Family Medicine

## 2014-01-21 NOTE — Telephone Encounter (Signed)
[facility] 10[facility]01/13  [facility]  If [facility]

## 2014-02-17 ENCOUNTER — Other Ambulatory Visit: Payer: Self-pay | Admitting: Family Medicine

## 2014-02-17 NOTE — Telephone Encounter (Signed)
Patient has appt on May 11th for follow-up. He was told to follow up in 6 months from last visit but was only given 4 months of refills.

## 2014-03-31 ENCOUNTER — Ambulatory Visit (INDEPENDENT_AMBULATORY_CARE_PROVIDER_SITE_OTHER): Payer: Medicare Other

## 2014-03-31 ENCOUNTER — Ambulatory Visit (INDEPENDENT_AMBULATORY_CARE_PROVIDER_SITE_OTHER): Payer: Medicare Other | Admitting: Family Medicine

## 2014-03-31 ENCOUNTER — Encounter: Payer: Self-pay | Admitting: Family Medicine

## 2014-03-31 VITALS — BP 129/76 | HR 78 | Temp 97.3°F | Ht 68.5 in | Wt 252.8 lb

## 2014-03-31 DIAGNOSIS — R5381 Other malaise: Secondary | ICD-10-CM

## 2014-03-31 DIAGNOSIS — R5383 Other fatigue: Secondary | ICD-10-CM

## 2014-03-31 DIAGNOSIS — E785 Hyperlipidemia, unspecified: Secondary | ICD-10-CM

## 2014-03-31 DIAGNOSIS — I10 Essential (primary) hypertension: Secondary | ICD-10-CM

## 2014-03-31 DIAGNOSIS — R0602 Shortness of breath: Secondary | ICD-10-CM

## 2014-03-31 DIAGNOSIS — Z125 Encounter for screening for malignant neoplasm of prostate: Secondary | ICD-10-CM

## 2014-03-31 LAB — POCT CBC
Granulocyte percent: 74.2 %G (ref 37–80)
HCT, POC: 51.7 % (ref 43.5–53.7)
Hemoglobin: 16.3 g/dL (ref 14.1–18.1)
Lymph, poc: 1.2 (ref 0.6–3.4)
MCH, POC: 28.7 pg (ref 27–31.2)
MCHC: 31.6 g/dL — AB (ref 31.8–35.4)
MCV: 90.8 fL (ref 80–97)
MPV: 8.6 fL (ref 0–99.8)
POC Granulocyte: 4 (ref 2–6.9)
POC LYMPH PERCENT: 22.3 %L (ref 10–50)
Platelet Count, POC: 183 10*3/uL (ref 142–424)
RBC: 5.7 M/uL (ref 4.69–6.13)
RDW, POC: 14.5 %
WBC: 5.4 10*3/uL (ref 4.6–10.2)

## 2014-03-31 NOTE — Progress Notes (Signed)
Subjective:    Patient ID: Logan West, male    DOB: 15-Jan-1934, 80 y.o.   MRN: [id number]  HPI This 80 y.o. male presents for evaluation of routine follow up.  Patient has hx of hypertension, hyperlipidemia, chronic pain, and anxiety.  He has no acute problems.  He is due for labs. He has hx of copd and obesity and he c/o doe which is chronic.  He denies fever or mucopurulent productive cough.   Review of Systems C/o Sob   No chest pain, SOB, HA, dizziness, vision change, N/V, diarrhea, constipation, dysuria, urinary urgency or frequency, myalgias, arthralgias or rash.  Objective:   Physical Exam  Vital signs noted  Well developed well nourished male.  HEENT - Head atraumatic Normocephalic                Eyes - PERRLA, Conjuctiva - clear Sclera- Clear EOMI                Ears - EAC's Wnl TM's Wnl Gross Hearing WNL                Throat - oropharanx wnl Respiratory - Lungs CTA bilateral Cardiac - RRR S1 and S2 without murmur GI - Abdomen soft Nontender and bowel sounds active x 4 Extremities - No edema. Neuro - Grossly intact.     cxr - no infiltrate Prelimnary reading by Iverson Alamin Assessment & Plan:  Fatigue - Plan: POCT CBC, Vit D  25 hydroxy (rtn osteoporosis monitoring), Thyroid Panel With TSH  Hypertension - Plan: CMP14+EGFR  Hyperlipemia - Plan: Lipid panel  Screening for prostate cancer - Plan: PSA, total and free  SOB (shortness of breath) - Plan: DG Chest 2 View  Probably due to copd an deconditioning  Lysbeth Penner FNP

## 2014-04-03 LAB — LIPID PANEL
Chol/HDL Ratio: 3.5 ratio units (ref 0.0–5.0)
Cholesterol, Total: 146 mg/dL (ref 100–199)
HDL: 42 mg/dL (ref >39)
LDL Calculated: 88 mg/dL (ref 0–99)
Triglycerides: 82 mg/dL (ref 0–149)
VLDL Cholesterol Cal: 16 mg/dL (ref 5–40)

## 2014-04-03 LAB — CMP14+EGFR
ALT: 11 IU/L (ref 0–44)
AST: 15 IU/L (ref 0–40)
Albumin/Globulin Ratio: 1.4 (ref 1.1–2.5)
Albumin: 3.8 g/dL (ref 3.5–4.8)
Alkaline Phosphatase: 90 IU/L (ref 39–117)
BUN/Creatinine Ratio: 12 (ref 10–22)
BUN: 13 mg/dL (ref 8–27)
CO2: 23 mmol/L (ref 18–29)
Calcium: 9.4 mg/dL (ref 8.6–10.2)
Chloride: 100 mmol/L (ref 97–108)
Creatinine, Ser: 1.06 mg/dL (ref 0.76–1.27)
GFR calc Af Amer: 78 mL/min/{1.73_m2} (ref >59)
GFR calc non Af Amer: 67 mL/min/{1.73_m2} (ref >59)
Globulin, Total: 2.7 g/dL (ref 1.5–4.5)
Glucose: 108 mg/dL — ABNORMAL HIGH (ref 65–99)
Potassium: 4.1 mmol/L (ref 3.5–5.2)
Sodium: 144 mmol/L (ref 134–144)
Total Bilirubin: 0.5 mg/dL (ref 0.0–1.2)
Total Protein: 6.5 g/dL (ref 6.0–8.5)

## 2014-04-03 LAB — THYROID PANEL WITH TSH
Free Thyroxine Index: 2.3 (ref 1.2–4.9)
T3 Uptake Ratio: 28 % (ref 24–39)
T4, Total: 8.1 ug/dL (ref 4.5–12.0)
TSH: 3.2 u[IU]/mL (ref 0.450–4.500)

## 2014-04-03 LAB — PSA, TOTAL AND FREE
PSA, Free Pct: 26.8 %
PSA, Free: 0.51 ng/mL
PSA: 1.9 ng/mL (ref 0.0–4.0)

## 2014-04-03 LAB — VITAMIN D 25 HYDROXY (VIT D DEFICIENCY, FRACTURES): Vit D, 25-Hydroxy: 13.6 ng/mL — ABNORMAL LOW (ref 30.0–100.0)

## 2014-04-04 ENCOUNTER — Other Ambulatory Visit: Payer: Self-pay | Admitting: Family Medicine

## 2014-04-04 MED ORDER — VITAMIN D (ERGOCALCIFEROL) 1.25 MG (50000 UNIT) PO CAPS: 50000.0000 [IU] | ORAL_CAPSULE | ORAL | Status: DC

## 2014-05-27 ENCOUNTER — Other Ambulatory Visit: Payer: Self-pay | Admitting: *Deleted

## 2014-05-27 DIAGNOSIS — E785 Hyperlipidemia, unspecified: Secondary | ICD-10-CM

## 2014-05-27 MED ORDER — FUROSEMIDE 20 MG PO TABS
20.0000 mg | ORAL_TABLET | Freq: Every day | ORAL | Status: DC
Start: 2014-05-27 — End: 2014-10-02

## 2014-06-12 ENCOUNTER — Other Ambulatory Visit: Payer: Self-pay | Admitting: *Deleted

## 2014-06-12 DIAGNOSIS — I10 Essential (primary) hypertension: Secondary | ICD-10-CM

## 2014-06-12 DIAGNOSIS — E785 Hyperlipidemia, unspecified: Secondary | ICD-10-CM

## 2014-06-12 MED ORDER — METOPROLOL TARTRATE 50 MG PO TABS: 50.0000 mg | ORAL_TABLET | Freq: Every day | ORAL | Status: DC

## 2014-06-14 ENCOUNTER — Other Ambulatory Visit: Payer: Self-pay | Admitting: Family Medicine

## 2014-06-16 NOTE — Telephone Encounter (Signed)
Last seen 03/31/14  [facility]  Print and route to nurse

## 2014-06-24 ENCOUNTER — Other Ambulatory Visit: Payer: Self-pay | Admitting: *Deleted

## 2014-06-24 DIAGNOSIS — E785 Hyperlipidemia, unspecified: Secondary | ICD-10-CM

## 2014-06-24 MED ORDER — PRAVASTATIN SODIUM 40 MG PO TABS: 40.0000 mg | ORAL_TABLET | Freq: Every day | ORAL | Status: DC

## 2014-06-24 MED ORDER — AMLODIPINE BESYLATE 10 MG PO TABS: 10.0000 mg | ORAL_TABLET | Freq: Every day | ORAL | Status: DC

## 2014-07-13 ENCOUNTER — Other Ambulatory Visit: Payer: Self-pay | Admitting: Family Medicine

## 2014-07-16 NOTE — Telephone Encounter (Signed)
Last filled 06/18/14, last seen 03/31/14

## 2014-07-17 ENCOUNTER — Other Ambulatory Visit: Payer: Self-pay | Admitting: Family Medicine

## 2014-07-17 ENCOUNTER — Telehealth: Payer: Self-pay | Admitting: Family Medicine

## 2014-07-17 MED ORDER — TRAMADOL HCL 50 MG PO TABS: 50.0000 mg | ORAL_TABLET | Freq: Four times a day (QID) | ORAL | Status: DC | PRN

## 2014-07-17 NOTE — Telephone Encounter (Signed)
RX printed and to the front for pt pick up 

## 2014-08-12 ENCOUNTER — Other Ambulatory Visit: Payer: Self-pay | Admitting: Family Medicine

## 2014-08-13 NOTE — Telephone Encounter (Signed)
Last seen 03/31/14  [facility]  If approved route to nurse [facility]

## 2014-08-14 ENCOUNTER — Other Ambulatory Visit: Payer: Self-pay | Admitting: Family Medicine

## 2014-08-21 ENCOUNTER — Other Ambulatory Visit: Payer: Self-pay | Admitting: Family Medicine

## 2014-09-11 ENCOUNTER — Ambulatory Visit (INDEPENDENT_AMBULATORY_CARE_PROVIDER_SITE_OTHER): Payer: Medicare Other

## 2014-09-11 DIAGNOSIS — Z23 Encounter for immunization: Secondary | ICD-10-CM

## 2014-09-17 ENCOUNTER — Other Ambulatory Visit: Payer: Self-pay | Admitting: Family Medicine

## 2014-09-18 NOTE — Telephone Encounter (Signed)
Last seen 03/31/14  [facility]  If approved route to nurse to call  [facility]

## 2014-09-19 NOTE — Telephone Encounter (Signed)
Please review

## 2014-09-22 ENCOUNTER — Other Ambulatory Visit: Payer: Self-pay | Admitting: Family Medicine

## 2014-09-23 ENCOUNTER — Telehealth: Payer: Self-pay | Admitting: Family Medicine

## 2014-09-23 NOTE — Telephone Encounter (Signed)
Aware, [facility], vm.

## 2014-09-23 NOTE — Telephone Encounter (Signed)
[facility]. Pt notified.

## 2014-10-02 ENCOUNTER — Ambulatory Visit (INDEPENDENT_AMBULATORY_CARE_PROVIDER_SITE_OTHER): Payer: Medicare Other | Admitting: Family Medicine

## 2014-10-02 VITALS — BP 126/73 | HR 74 | Temp 97.4°F | Wt 254.6 lb

## 2014-10-02 DIAGNOSIS — E785 Hyperlipidemia, unspecified: Secondary | ICD-10-CM

## 2014-10-02 DIAGNOSIS — I10 Essential (primary) hypertension: Secondary | ICD-10-CM

## 2014-10-02 LAB — POCT CBC
Granulocyte percent: 74.8 %G (ref 37–80)
HCT, POC: 50.5 % (ref 43.5–53.7)
Hemoglobin: 16.6 g/dL (ref 14.1–18.1)
Lymph, poc: 1.2 (ref 0.6–3.4)
MCH, POC: 29.6 pg (ref 27–31.2)
MCHC: 32.9 g/dL (ref 31.8–35.4)
MCV: 90 fL (ref 80–97)
MPV: 7.5 fL (ref 0–99.8)
POC Granulocyte: 4.1 (ref 2–6.9)
POC LYMPH PERCENT: 21.1 %L (ref 10–50)
Platelet Count, POC: 211 10*3/uL (ref 142–424)
RBC: 5.6 M/uL (ref 4.69–6.13)
RDW, POC: 14 %
WBC: 5.5 10*3/uL (ref 4.6–10.2)

## 2014-10-02 MED ORDER — METOPROLOL TARTRATE 50 MG PO TABS: 50.0000 mg | ORAL_TABLET | Freq: Every day | ORAL | Status: DC

## 2014-10-02 MED ORDER — FUROSEMIDE 20 MG PO TABS: 20.0000 mg | ORAL_TABLET | Freq: Every day | ORAL | Status: DC

## 2014-10-02 MED ORDER — TRAMADOL HCL 50 MG PO TABS: 50.0000 mg | ORAL_TABLET | Freq: Four times a day (QID) | ORAL | Status: DC | PRN

## 2014-10-02 NOTE — Progress Notes (Signed)
   Subjective:    Patient ID: Logan West, male    DOB: October 26, 1934, 80 y.o.   MRN: [id number]  HPI Patient is here for annual exam.  He is here for refills.  He is out of his tramadol for pain.    He states he has plenty of nerve pills. He recently had refills on his ativan.  Review of Systems  Constitutional: Negative for fever.  HENT: Negative for ear pain.   Eyes: Negative for discharge.  Respiratory: Negative for cough.   Cardiovascular: Negative for chest pain.  Gastrointestinal: Negative for abdominal distention.  Endocrine: Negative for polyuria.  Genitourinary: Negative for difficulty urinating.  Musculoskeletal: Negative for gait problem and neck pain.  Skin: Negative for color change and rash.  Neurological: Negative for speech difficulty and headaches.  Psychiatric/Behavioral: Negative for agitation.       Objective:    BP 126/73 mmHg  Pulse 74  Temp(Src) 97.4 F (36.3 C) (Oral)  Wt 254 lb 9.6 oz (115.486 kg) Physical Exam  Constitutional: He is oriented to person, place, and time. He appears well-developed and well-nourished.  HENT:  Head: Normocephalic and atraumatic.  Mouth/Throat: Oropharynx is clear and moist.  Eyes: Pupils are equal, round, and reactive to light.  Neck: Normal range of motion. Neck supple.  Cardiovascular: Normal rate and regular rhythm.   No murmur heard. Pulmonary/Chest: Effort normal and breath sounds normal.  Abdominal: Soft. Bowel sounds are normal. There is no tenderness.  Neurological: He is alert and oriented to person, place, and time.  Skin: Skin is warm and dry.  Psychiatric: He has a normal mood and affect.          Assessment & Plan:     ICD-9-CM ICD-10-CM   1. Essential hypertension, benign 401.1 I10 POCT CBC     CMP14+EGFR  2. Hyperlipemia 272.4 E78.5 Lipid panel  3. Essential hypertension 401.9 I10 metoprolol (LOPRESSOR) 50 MG tablet     furosemide (LASIX) 20 MG tablet     No Follow-up on file.  Lysbeth Penner FNP

## 2014-10-03 LAB — CMP14+EGFR
ALT: 11 IU/L (ref 0–44)
AST: 17 IU/L (ref 0–40)
Albumin/Globulin Ratio: 1.5 (ref 1.1–2.5)
Albumin: 4.1 g/dL (ref 3.5–4.8)
Alkaline Phosphatase: 92 IU/L (ref 39–117)
BUN/Creatinine Ratio: 10 (ref 10–22)
BUN: 13 mg/dL (ref 8–27)
CO2: 28 mmol/L (ref 18–29)
Calcium: 9.5 mg/dL (ref 8.6–10.2)
Chloride: 98 mmol/L (ref 97–108)
Creatinine, Ser: 1.28 mg/dL — ABNORMAL HIGH (ref 0.76–1.27)
GFR calc Af Amer: 62 mL/min/{1.73_m2} (ref >59)
GFR calc non Af Amer: 53 mL/min/{1.73_m2} — ABNORMAL LOW (ref >59)
Globulin, Total: 2.8 g/dL (ref 1.5–4.5)
Glucose: 104 mg/dL — ABNORMAL HIGH (ref 65–99)
Potassium: 4.2 mmol/L (ref 3.5–5.2)
Sodium: 142 mmol/L (ref 134–144)
Total Bilirubin: 0.9 mg/dL (ref 0.0–1.2)
Total Protein: 6.9 g/dL (ref 6.0–8.5)

## 2014-10-03 LAB — LIPID PANEL
Chol/HDL Ratio: 3.4 ratio units (ref 0.0–5.0)
Cholesterol, Total: 146 mg/dL (ref 100–199)
HDL: 43 mg/dL (ref >39)
LDL Calculated: 86 mg/dL (ref 0–99)
Triglycerides: 86 mg/dL (ref 0–149)
VLDL Cholesterol Cal: 17 mg/dL (ref 5–40)

## 2014-10-14 ENCOUNTER — Other Ambulatory Visit: Payer: Self-pay | Admitting: Family Medicine

## 2014-10-28 ENCOUNTER — Other Ambulatory Visit: Payer: Self-pay | Admitting: Family Medicine

## 2014-11-17 ENCOUNTER — Other Ambulatory Visit: Payer: Self-pay | Admitting: Family Medicine

## 2014-11-18 NOTE — Telephone Encounter (Signed)
Last seen 10/02/14, last filled 10/20/14. [facility]

## 2014-11-18 NOTE — Telephone Encounter (Signed)
Please review and advise.

## 2014-11-22 ENCOUNTER — Other Ambulatory Visit: Payer: Self-pay | Admitting: Family Medicine

## 2014-11-24 ENCOUNTER — Telehealth: Payer: Self-pay | Admitting: *Deleted

## 2014-11-24 NOTE — Telephone Encounter (Signed)
Called in refill for Lorazepam okayed per [employer]

## 2014-12-02 DIAGNOSIS — J449 Chronic obstructive pulmonary disease, unspecified: Secondary | ICD-10-CM | POA: Diagnosis not present

## 2014-12-13 ENCOUNTER — Other Ambulatory Visit: Payer: Self-pay | Admitting: Family Medicine

## 2014-12-18 DIAGNOSIS — J449 Chronic obstructive pulmonary disease, unspecified: Secondary | ICD-10-CM | POA: Diagnosis not present

## 2015-01-02 DIAGNOSIS — J449 Chronic obstructive pulmonary disease, unspecified: Secondary | ICD-10-CM | POA: Diagnosis not present

## 2015-01-10 ENCOUNTER — Other Ambulatory Visit: Payer: Self-pay | Admitting: Family Medicine

## 2015-01-12 NOTE — Telephone Encounter (Signed)
Please call in ativan with 1 refills 

## 2015-01-12 NOTE — Telephone Encounter (Signed)
[facility] 

## 2015-01-18 DIAGNOSIS — J449 Chronic obstructive pulmonary disease, unspecified: Secondary | ICD-10-CM | POA: Diagnosis not present

## 2015-01-29 ENCOUNTER — Other Ambulatory Visit: Payer: Self-pay | Admitting: Family Medicine

## 2015-01-31 DIAGNOSIS — J449 Chronic obstructive pulmonary disease, unspecified: Secondary | ICD-10-CM | POA: Diagnosis not present

## 2015-02-16 DIAGNOSIS — J449 Chronic obstructive pulmonary disease, unspecified: Secondary | ICD-10-CM | POA: Diagnosis not present

## 2015-03-03 DIAGNOSIS — J449 Chronic obstructive pulmonary disease, unspecified: Secondary | ICD-10-CM | POA: Diagnosis not present

## 2015-03-11 ENCOUNTER — Other Ambulatory Visit: Payer: Self-pay | Admitting: Family Medicine

## 2015-03-12 ENCOUNTER — Telehealth: Payer: Self-pay | Admitting: *Deleted

## 2015-03-12 NOTE — Telephone Encounter (Signed)
Patient aware,Tramadol script .

## 2015-03-12 NOTE — Telephone Encounter (Signed)
Last seen 10/02/14 B  [place]  If approved print

## 2015-03-13 ENCOUNTER — Other Ambulatory Visit: Payer: Self-pay | Admitting: Nurse Practitioner

## 2015-03-16 ENCOUNTER — Telehealth: Payer: Self-pay | Admitting: Physician Assistant

## 2015-03-16 NOTE — Telephone Encounter (Signed)
Does not need tramadol, needs lorazepam. Call into

## 2015-03-17 MED ORDER — LORAZEPAM 1 MG PO TABS: 1.0000 mg | ORAL_TABLET | Freq: Three times a day (TID) | ORAL | Status: DC | PRN

## 2015-03-17 NOTE — Telephone Encounter (Signed)
Please call in lorazepam 1 mg BID #60  with 0 refills

## 2015-03-17 NOTE — Telephone Encounter (Signed)
Pt is completely out of his lorazepam and has scheduled an appt 4/29 with Lynwood Dawleyiffany Gann, ok to give rx for lorazepam 1mg ? Please advise.

## 2015-03-17 NOTE — Telephone Encounter (Signed)
Refill Ativan lmovm at [facility]

## 2015-03-19 DIAGNOSIS — J449 Chronic obstructive pulmonary disease, unspecified: Secondary | ICD-10-CM | POA: Diagnosis not present

## 2015-03-20 ENCOUNTER — Ambulatory Visit (INDEPENDENT_AMBULATORY_CARE_PROVIDER_SITE_OTHER): Payer: Medicare Other | Admitting: Physician Assistant

## 2015-03-20 ENCOUNTER — Encounter: Payer: Self-pay | Admitting: Physician Assistant

## 2015-03-20 VITALS — BP 98/55 | HR 91 | Temp 97.3°F | Ht 68.5 in | Wt 254.0 lb

## 2015-03-20 DIAGNOSIS — E785 Hyperlipidemia, unspecified: Secondary | ICD-10-CM | POA: Diagnosis not present

## 2015-03-20 DIAGNOSIS — F411 Generalized anxiety disorder: Secondary | ICD-10-CM

## 2015-03-20 DIAGNOSIS — M1990 Unspecified osteoarthritis, unspecified site: Secondary | ICD-10-CM | POA: Diagnosis not present

## 2015-03-20 DIAGNOSIS — I10 Essential (primary) hypertension: Secondary | ICD-10-CM | POA: Diagnosis not present

## 2015-03-20 LAB — POCT CBC
Granulocyte percent: 67.7 %G (ref 37–80)
HCT, POC: 52.2 % (ref 43.5–53.7)
Hemoglobin: 15.9 g/dL (ref 14.1–18.1)
Lymph, poc: 1.5 (ref 0.6–3.4)
MCH, POC: 28 pg (ref 27–31.2)
MCHC: 30.5 g/dL — AB (ref 31.8–35.4)
MCV: 91.7 fL (ref 80–97)
MPV: 7.3 fL (ref 0–99.8)
PLATELET COUNT, POC: 247 10*3/uL (ref 142–424)
POC GRANULOCYTE: 4.1 (ref 2–6.9)
POC LYMPH PERCENT: 24.1 %L (ref 10–50)
RBC: 5.69 M/uL (ref 4.69–6.13)
RDW, POC: 14.7 %
WBC: 6.1 10*3/uL (ref 4.6–10.2)

## 2015-03-20 MED ORDER — PRAVASTATIN SODIUM 40 MG PO TABS: ORAL_TABLET | ORAL | Status: DC

## 2015-03-20 MED ORDER — LORAZEPAM 1 MG PO TABS: 1.0000 mg | ORAL_TABLET | Freq: Three times a day (TID) | ORAL | Status: DC | PRN

## 2015-03-20 MED ORDER — AMLODIPINE BESYLATE 10 MG PO TABS: ORAL_TABLET | ORAL | Status: DC

## 2015-03-20 MED ORDER — FUROSEMIDE 20 MG PO TABS: ORAL_TABLET | ORAL | Status: DC

## 2015-03-20 MED ORDER — TRAMADOL HCL 50 MG PO TABS: 50.0000 mg | ORAL_TABLET | Freq: Two times a day (BID) | ORAL | Status: DC | PRN

## 2015-03-20 MED ORDER — METOPROLOL TARTRATE 50 MG PO TABS: ORAL_TABLET | ORAL | Status: DC

## 2015-03-20 NOTE — Progress Notes (Signed)
   Subjective:    Patient ID: Logan West, male    DOB: Jan 30, 1934, 81 y.o.   MRN: [id number]  HPI 81 y/o male presents for refill of medication. Last labs were 09/2014. No new complaints today besides his chronic conditions of hyperlipidemia, HTN, COPD, arthritis and anxiety.     Review of Systems  Respiratory: Positive for shortness of breath (chronic ).   Musculoskeletal: Positive for arthralgias (chronic).  Psychiatric/Behavioral: The patient is nervous/anxious.   All other systems reviewed and are negative.      Objective:   Physical Exam  Constitutional: He is oriented to person, place, and time. He appears well-developed and well-nourished.  Obese    HENT:  Head: Normocephalic and atraumatic.  Cardiovascular: Normal rate, regular rhythm, normal heart sounds and intact distal pulses.  Exam reveals no gallop and no friction rub.   No murmur heard. Pulmonary/Chest: Effort normal. He has wheezes (slight expiratory bilateral ). He has no rales. He exhibits no tenderness.  Musculoskeletal: Normal range of motion.  Neurological: He is alert and oriented to person, place, and time.  Psychiatric: He has a normal mood and affect. His behavior is normal. Judgment and thought content normal.  Nursing note and vitals reviewed.         Assessment & Plan:  1. Essential hypertension, benign  - POCT CBC - CMP14+EGFR - amLODipine (NORVASC) 10 MG tablet; TAKE 1 TABLET (10 MG TOTAL) BY MOUTH DAILY.  Dispense: 30 tablet; Refill: 5 - furosemide (LASIX) 20 MG tablet; TAKE 1 TABLET (20 MG TOTAL) BY MOUTH DAILY.  Dispense: 30 tablet; Refill: 5 - metoprolol (LOPRESSOR) 50 MG tablet; TAKE 1 TABLET (50 MG TOTAL) BY MOUTH DAILY.  Dispense: 30 tablet; Refill: 5  2. Hyperlipemia  - Lipid panel - pravastatin (PRAVACHOL) 40 MG tablet; TAKE 1 TABLET (40 MG TOTAL) BY MOUTH DAILY.  Dispense: 30 tablet; Refill: 5  3. Arthritis  - traMADol (ULTRAM) 50 MG tablet; Take 1 tablet (50 mg total) by  mouth every 12 (twelve) hours as needed for moderate pain.  Dispense: 90 tablet; Refill: 0  4. Anxiety state  - LORazepam (ATIVAN) 1 MG tablet; Take 1 tablet (1 mg total) by mouth every 8 (eight) hours as needed for anxiety.  Dispense: 60 tablet; Refill: 4   Continue all meds Labs pending Health Maintenance reviewed Diet and exercise encouraged RTO 6 months   Elaijah Munoz A. Benjamin Stain PA-C

## 2015-03-21 LAB — LIPID PANEL
Chol/HDL Ratio: 2.9 ratio units (ref 0.0–5.0)
Cholesterol, Total: 135 mg/dL (ref 100–199)
HDL: 47 mg/dL (ref >39)
LDL Calculated: 70 mg/dL (ref 0–99)
Triglycerides: 88 mg/dL (ref 0–149)
VLDL CHOLESTEROL CAL: 18 mg/dL (ref 5–40)

## 2015-03-21 LAB — CMP14+EGFR
ALT: 11 IU/L (ref 0–44)
AST: 13 IU/L (ref 0–40)
Albumin/Globulin Ratio: 1.5 (ref 1.1–2.5)
Albumin: 4 g/dL (ref 3.5–4.8)
Alkaline Phosphatase: 81 IU/L (ref 39–117)
BUN/Creatinine Ratio: 10 (ref 10–22)
BUN: 10 mg/dL (ref 8–27)
Bilirubin Total: 0.7 mg/dL (ref 0.0–1.2)
CALCIUM: 9.5 mg/dL (ref 8.6–10.2)
CO2: 29 mmol/L (ref 18–29)
CREATININE: 1.02 mg/dL (ref 0.76–1.27)
Chloride: 98 mmol/L (ref 97–108)
GFR calc Af Amer: 81 mL/min/{1.73_m2} (ref >59)
GFR, EST NON AFRICAN AMERICAN: 70 mL/min/{1.73_m2} (ref >59)
GLOBULIN, TOTAL: 2.7 g/dL (ref 1.5–4.5)
GLUCOSE: 93 mg/dL (ref 65–99)
Potassium: 4.3 mmol/L (ref 3.5–5.2)
SODIUM: 143 mmol/L (ref 134–144)
TOTAL PROTEIN: 6.7 g/dL (ref 6.0–8.5)

## 2015-04-02 DIAGNOSIS — J449 Chronic obstructive pulmonary disease, unspecified: Secondary | ICD-10-CM | POA: Diagnosis not present

## 2015-04-18 DIAGNOSIS — J449 Chronic obstructive pulmonary disease, unspecified: Secondary | ICD-10-CM | POA: Diagnosis not present

## 2015-05-03 DIAGNOSIS — J449 Chronic obstructive pulmonary disease, unspecified: Secondary | ICD-10-CM | POA: Diagnosis not present

## 2015-05-14 ENCOUNTER — Other Ambulatory Visit: Payer: Self-pay | Admitting: Physician Assistant

## 2015-05-14 NOTE — Telephone Encounter (Signed)
Last sen 03/20/15 Logan West  If approved print

## 2015-05-18 ENCOUNTER — Telehealth: Payer: Self-pay | Admitting: Physician Assistant

## 2015-05-18 ENCOUNTER — Other Ambulatory Visit: Payer: Self-pay | Admitting: Physician Assistant

## 2015-05-18 DIAGNOSIS — M1990 Unspecified osteoarthritis, unspecified site: Secondary | ICD-10-CM

## 2015-05-18 MED ORDER — TRAMADOL HCL 50 MG PO TABS: 50.0000 mg | ORAL_TABLET | Freq: Two times a day (BID) | ORAL | Status: DC | PRN

## 2015-05-18 NOTE — Telephone Encounter (Signed)
Prescription printed. Is at front office Yamna Mackel A. Chauncey ReadingGann PA-C

## 2015-05-19 ENCOUNTER — Telehealth: Payer: Self-pay | Admitting: Physician Assistant

## 2015-05-19 DIAGNOSIS — J449 Chronic obstructive pulmonary disease, unspecified: Secondary | ICD-10-CM | POA: Diagnosis not present

## 2015-05-19 NOTE — Telephone Encounter (Signed)
Patient aware is ready to be picked up.

## 2015-05-20 NOTE — Telephone Encounter (Signed)
Called the pharmacy and advised that the provider is out of the office right now but I will get in touch with Tiffany today regarding this rx and call the pharmacy back. Pharmacist will call pt and let him know.

## 2015-05-23 NOTE — Telephone Encounter (Signed)
This has been taken care of, will close encounter

## 2015-06-02 DIAGNOSIS — J449 Chronic obstructive pulmonary disease, unspecified: Secondary | ICD-10-CM | POA: Diagnosis not present

## 2015-06-18 DIAGNOSIS — J449 Chronic obstructive pulmonary disease, unspecified: Secondary | ICD-10-CM | POA: Diagnosis not present

## 2015-07-03 DIAGNOSIS — J449 Chronic obstructive pulmonary disease, unspecified: Secondary | ICD-10-CM | POA: Diagnosis not present

## 2015-07-08 ENCOUNTER — Other Ambulatory Visit: Payer: Self-pay | Admitting: Physician Assistant

## 2015-07-08 NOTE — Telephone Encounter (Signed)
Last seen 03/20/15  Tiffany  If approved print

## 2015-07-09 NOTE — Telephone Encounter (Signed)
Patient aware that script is available for pickup at front desk 

## 2015-07-19 DIAGNOSIS — J449 Chronic obstructive pulmonary disease, unspecified: Secondary | ICD-10-CM | POA: Diagnosis not present

## 2015-08-01 ENCOUNTER — Encounter (HOSPITAL_COMMUNITY): Payer: Self-pay | Admitting: Emergency Medicine

## 2015-08-01 ENCOUNTER — Emergency Department (HOSPITAL_COMMUNITY): Payer: Medicare Other

## 2015-08-01 ENCOUNTER — Inpatient Hospital Stay (HOSPITAL_COMMUNITY)
Admission: EM | Admit: 2015-08-01 | Discharge: 2015-08-04 | DRG: 308 | Disposition: A | Discharge status: Home / Self Care | Payer: Medicare Other | Attending: Internal Medicine | Admitting: Internal Medicine

## 2015-08-01 DIAGNOSIS — K7460 Unspecified cirrhosis of liver: Secondary | ICD-10-CM | POA: Diagnosis not present

## 2015-08-01 DIAGNOSIS — R1012 Left upper quadrant pain: Secondary | ICD-10-CM

## 2015-08-01 DIAGNOSIS — I4891 Unspecified atrial fibrillation: Principal | ICD-10-CM | POA: Diagnosis present

## 2015-08-01 DIAGNOSIS — Z87891 Personal history of nicotine dependence: Secondary | ICD-10-CM | POA: Diagnosis not present

## 2015-08-01 DIAGNOSIS — E785 Hyperlipidemia, unspecified: Secondary | ICD-10-CM | POA: Diagnosis not present

## 2015-08-01 DIAGNOSIS — I5042 Chronic combined systolic (congestive) and diastolic (congestive) heart failure: Secondary | ICD-10-CM | POA: Diagnosis not present

## 2015-08-01 DIAGNOSIS — J9601 Acute respiratory failure with hypoxia: Secondary | ICD-10-CM | POA: Diagnosis not present

## 2015-08-01 DIAGNOSIS — Z7951 Long term (current) use of inhaled steroids: Secondary | ICD-10-CM

## 2015-08-01 DIAGNOSIS — I10 Essential (primary) hypertension: Secondary | ICD-10-CM | POA: Diagnosis not present

## 2015-08-01 DIAGNOSIS — Z825 Family history of asthma and other chronic lower respiratory diseases: Secondary | ICD-10-CM | POA: Diagnosis not present

## 2015-08-01 DIAGNOSIS — J9621 Acute and chronic respiratory failure with hypoxia: Secondary | ICD-10-CM | POA: Diagnosis present

## 2015-08-01 DIAGNOSIS — R0602 Shortness of breath: Secondary | ICD-10-CM | POA: Diagnosis not present

## 2015-08-01 DIAGNOSIS — J441 Chronic obstructive pulmonary disease with (acute) exacerbation: Secondary | ICD-10-CM | POA: Diagnosis present

## 2015-08-01 DIAGNOSIS — I7090 Unspecified atherosclerosis: Secondary | ICD-10-CM | POA: Diagnosis not present

## 2015-08-01 DIAGNOSIS — Z8249 Family history of ischemic heart disease and other diseases of the circulatory system: Secondary | ICD-10-CM

## 2015-08-01 DIAGNOSIS — F419 Anxiety disorder, unspecified: Secondary | ICD-10-CM | POA: Diagnosis present

## 2015-08-01 DIAGNOSIS — G894 Chronic pain syndrome: Secondary | ICD-10-CM | POA: Diagnosis present

## 2015-08-01 DIAGNOSIS — Z9981 Dependence on supplemental oxygen: Secondary | ICD-10-CM | POA: Diagnosis not present

## 2015-08-01 DIAGNOSIS — Z7982 Long term (current) use of aspirin: Secondary | ICD-10-CM | POA: Diagnosis not present

## 2015-08-01 DIAGNOSIS — J449 Chronic obstructive pulmonary disease, unspecified: Secondary | ICD-10-CM | POA: Diagnosis not present

## 2015-08-01 DIAGNOSIS — Z809 Family history of malignant neoplasm, unspecified: Secondary | ICD-10-CM | POA: Diagnosis not present

## 2015-08-01 DIAGNOSIS — R109 Unspecified abdominal pain: Secondary | ICD-10-CM

## 2015-08-01 DIAGNOSIS — I5032 Chronic diastolic (congestive) heart failure: Secondary | ICD-10-CM | POA: Diagnosis not present

## 2015-08-01 LAB — CBC WITH DIFFERENTIAL/PLATELET
BASOS ABS: 0 10*3/uL (ref 0.0–0.1)
Basophils Relative: 0 % (ref 0–1)
EOS ABS: 0.3 10*3/uL (ref 0.0–0.7)
EOS PCT: 5 % (ref 0–5)
HCT: 54.7 % — ABNORMAL HIGH (ref 39.0–52.0)
Hemoglobin: 17.7 g/dL — ABNORMAL HIGH (ref 13.0–17.0)
LYMPHS ABS: 1.4 10*3/uL (ref 0.7–4.0)
Lymphocytes Relative: 23 % (ref 12–46)
MCH: 31.3 pg (ref 26.0–34.0)
MCHC: 32.4 g/dL (ref 30.0–36.0)
MCV: 96.8 fL (ref 78.0–100.0)
MONO ABS: 0.6 10*3/uL (ref 0.1–1.0)
Monocytes Relative: 10 % (ref 3–12)
Neutro Abs: 3.9 10*3/uL (ref 1.7–7.7)
Neutrophils Relative %: 62 % (ref 43–77)
PLATELETS: 193 10*3/uL (ref 150–400)
RBC: 5.65 MIL/uL (ref 4.22–5.81)
RDW: 13.7 % (ref 11.5–15.5)
WBC: 6.2 10*3/uL (ref 4.0–10.5)

## 2015-08-01 LAB — BASIC METABOLIC PANEL
ANION GAP: 7 (ref 5–15)
BUN: 14 mg/dL (ref 6–20)
CALCIUM: 9.2 mg/dL (ref 8.9–10.3)
CO2: 33 mmol/L — AB (ref 22–32)
CREATININE: 1.04 mg/dL (ref 0.61–1.24)
Chloride: 100 mmol/L — ABNORMAL LOW (ref 101–111)
GFR calc Af Amer: >60 mL/min (ref >60)
GLUCOSE: 102 mg/dL — AB (ref 65–99)
Potassium: 3.6 mmol/L (ref 3.5–5.1)
Sodium: 140 mmol/L (ref 135–145)

## 2015-08-01 LAB — PROTIME-INR
INR: 1.01 (ref 0.00–1.49)
PROTHROMBIN TIME: 13.5 s (ref 11.6–15.2)

## 2015-08-01 LAB — BRAIN NATRIURETIC PEPTIDE: B Natriuretic Peptide: 75 pg/mL (ref 0.0–100.0)

## 2015-08-01 LAB — MRSA PCR SCREENING: MRSA BY PCR: NEGATIVE

## 2015-08-01 LAB — TROPONIN I

## 2015-08-01 MED ORDER — LEVALBUTEROL HCL 1.25 MG/0.5ML IN NEBU: 1.2500 mg | INHALATION_SOLUTION | Freq: Four times a day (QID) | RESPIRATORY_TRACT | Status: DC

## 2015-08-01 MED ORDER — METOPROLOL SUCCINATE ER 50 MG PO TB24
50.0000 mg | ORAL_TABLET | Freq: Every day | ORAL | Status: DC
  Administered 2015-08-01 – 2015-08-02 (×2): 50 mg via ORAL
  Filled 2015-08-01 (×5): qty 1

## 2015-08-01 MED ORDER — METHYLPREDNISOLONE SODIUM SUCC 125 MG IJ SOLR
60.0000 mg | Freq: Four times a day (QID) | INTRAMUSCULAR | Status: DC
  Administered 2015-08-01 – 2015-08-03 (×6): 60 mg via INTRAVENOUS
  Filled 2015-08-01 (×6): qty 2

## 2015-08-01 MED ORDER — HEPARIN (PORCINE) IN NACL 100-0.45 UNIT/ML-% IJ SOLN
1150.0000 [IU]/h | INTRAMUSCULAR | Status: DC
  Administered 2015-08-01: 1400 [IU]/h via INTRAVENOUS
  Administered 2015-08-02: 1300 [IU]/h via INTRAVENOUS
  Administered 2015-08-03: 1150 [IU]/h via INTRAVENOUS
  Filled 2015-08-01 (×3): qty 250

## 2015-08-01 MED ORDER — ACETAMINOPHEN 325 MG PO TABS: 650.0000 mg | ORAL_TABLET | Freq: Four times a day (QID) | ORAL | Status: DC | PRN

## 2015-08-01 MED ORDER — IPRATROPIUM BROMIDE 0.02 % IN SOLN
0.5000 mg | Freq: Four times a day (QID) | RESPIRATORY_TRACT | Status: DC
  Administered 2015-08-01 – 2015-08-02 (×2): 0.5 mg via RESPIRATORY_TRACT
  Filled 2015-08-01 (×2): qty 2.5

## 2015-08-01 MED ORDER — DILTIAZEM HCL 100 MG IV SOLR
5.0000 mg/h | INTRAVENOUS | Status: DC
  Administered 2015-08-01: 5 mg/h via INTRAVENOUS
  Administered 2015-08-01: 10 mg/h via INTRAVENOUS
  Administered 2015-08-02: 5 mg/h via INTRAVENOUS
  Administered 2015-08-02 (×2): 10 mg/h via INTRAVENOUS
  Filled 2015-08-01 (×5): qty 100

## 2015-08-01 MED ORDER — IPRATROPIUM BROMIDE 0.02 % IN SOLN: 0.5000 mg | Freq: Four times a day (QID) | RESPIRATORY_TRACT | Status: DC

## 2015-08-01 MED ORDER — AMLODIPINE BESYLATE 5 MG PO TABS
10.0000 mg | ORAL_TABLET | Freq: Every day | ORAL | Status: DC
  Filled 2015-08-01: qty 2

## 2015-08-01 MED ORDER — LEVALBUTEROL HCL 1.25 MG/0.5ML IN NEBU
1.2500 mg | INHALATION_SOLUTION | Freq: Four times a day (QID) | RESPIRATORY_TRACT | Status: DC
  Administered 2015-08-01: 1.25 mg via RESPIRATORY_TRACT
  Filled 2015-08-01 (×2): qty 0.5

## 2015-08-01 MED ORDER — ONDANSETRON HCL 4 MG PO TABS: 4.0000 mg | ORAL_TABLET | Freq: Four times a day (QID) | ORAL | Status: DC | PRN

## 2015-08-01 MED ORDER — DILTIAZEM LOAD VIA INFUSION
20.0000 mg | Freq: Once | INTRAVENOUS | Status: AC
  Administered 2015-08-01: 20 mg via INTRAVENOUS
  Filled 2015-08-01: qty 20

## 2015-08-01 MED ORDER — HEPARIN BOLUS VIA INFUSION
4000.0000 [IU] | Freq: Once | INTRAVENOUS | Status: AC
  Administered 2015-08-01: 4000 [IU] via INTRAVENOUS

## 2015-08-01 MED ORDER — ONDANSETRON HCL 4 MG/2ML IJ SOLN
4.0000 mg | Freq: Four times a day (QID) | INTRAMUSCULAR | Status: DC | PRN
  Administered 2015-08-02: 4 mg via INTRAVENOUS
  Filled 2015-08-01: qty 2

## 2015-08-01 MED ORDER — ALBUTEROL (5 MG/ML) CONTINUOUS INHALATION SOLN
10.0000 mg/h | INHALATION_SOLUTION | Freq: Once | RESPIRATORY_TRACT | Status: AC
  Administered 2015-08-01: 10 mg/h via RESPIRATORY_TRACT
  Filled 2015-08-01: qty 20

## 2015-08-01 MED ORDER — SODIUM CHLORIDE 0.9 % IJ SOLN
3.0000 mL | Freq: Two times a day (BID) | INTRAMUSCULAR | Status: DC
  Administered 2015-08-01 – 2015-08-03 (×5): 3 mL via INTRAVENOUS

## 2015-08-01 MED ORDER — PRAVASTATIN SODIUM 40 MG PO TABS
40.0000 mg | ORAL_TABLET | Freq: Every day | ORAL | Status: DC
  Administered 2015-08-02 – 2015-08-04 (×3): 40 mg via ORAL
  Filled 2015-08-01 (×5): qty 1

## 2015-08-01 MED ORDER — ASPIRIN EC 81 MG PO TBEC
81.0000 mg | DELAYED_RELEASE_TABLET | Freq: Every day | ORAL | Status: DC
  Administered 2015-08-01 – 2015-08-02 (×2): 81 mg via ORAL
  Filled 2015-08-01 (×2): qty 1

## 2015-08-01 MED ORDER — FUROSEMIDE 20 MG PO TABS
20.0000 mg | ORAL_TABLET | Freq: Every day | ORAL | Status: DC
  Administered 2015-08-02 – 2015-08-04 (×3): 20 mg via ORAL
  Filled 2015-08-01 (×3): qty 1

## 2015-08-01 MED ORDER — METHYLPREDNISOLONE SODIUM SUCC 125 MG IJ SOLR
125.0000 mg | Freq: Once | INTRAMUSCULAR | Status: AC
  Administered 2015-08-01: 125 mg via INTRAVENOUS
  Filled 2015-08-01: qty 2

## 2015-08-01 MED ORDER — ACETAMINOPHEN 650 MG RE SUPP: 650.0000 mg | Freq: Four times a day (QID) | RECTAL | Status: DC | PRN

## 2015-08-01 MED ORDER — ASPIRIN 81 MG PO TABS: 81.0000 mg | ORAL_TABLET | Freq: Every day | ORAL | Status: DC

## 2015-08-01 MED ORDER — TRAMADOL HCL 50 MG PO TABS
50.0000 mg | ORAL_TABLET | Freq: Two times a day (BID) | ORAL | Status: DC | PRN
  Administered 2015-08-02: 50 mg via ORAL
  Filled 2015-08-01: qty 1

## 2015-08-01 MED ORDER — LORAZEPAM 1 MG PO TABS
1.0000 mg | ORAL_TABLET | Freq: Three times a day (TID) | ORAL | Status: DC | PRN
  Administered 2015-08-01 – 2015-08-02 (×2): 1 mg via ORAL
  Filled 2015-08-01 (×2): qty 2

## 2015-08-01 MED ORDER — DOXYCYCLINE HYCLATE 100 MG PO TABS
100.0000 mg | ORAL_TABLET | Freq: Two times a day (BID) | ORAL | Status: DC
  Administered 2015-08-01 – 2015-08-04 (×6): 100 mg via ORAL
  Filled 2015-08-01 (×6): qty 1

## 2015-08-01 NOTE — ED Notes (Signed)
Pt has been coughing, SOB, on home 02 states 02 will drop into 70% when moving around. Pt is congested and wheezing

## 2015-08-01 NOTE — ED Provider Notes (Signed)
CSN: [id number]     Arrival date & time 08/01/15  1357 History   First MD Initiated Contact with Patient 08/01/15 1408     Chief Complaint  Patient presents with  . Shortness of Breath      HPI  Patient presents for evaluation of 3 days worsening shortness of breath. He states his wife is "admitted at [facility] for the same thing". Worsening shortness of breath. He's noticed that his oxygen saturation on his typical 2 L has been as low as 70 when he is up and around and 88-90 at rest. He "did up" at 3 L. It remains 91. He is also noticed that on his home monitor his heart rate has been faster for the last 2 days. He weighs himself. He states he is actually lost 2 pounds rather than gain. He has not developed lower extremity edema. No chest pain. No fever shakes chills.  History of COPD on MDIs. Does not use nebulizers at home.  Past Medical History  Diagnosis Date  . Hypertension     x30 years  . Hyperlipidemia     x 7-8  . COPD (chronic obstructive pulmonary disease)   . Obesity   . Chronic pain    Past Surgical History  Procedure Laterality Date  . Percutaneous pinning      right forearm fracture  . Fracture surgery      right forearm   Family History  Problem Relation Age of Onset  . Cancer Mother   . Asthma Father   . Heart attack Brother     same mother.  MI in his 24s  . Coronary artery disease Other    Social History  Substance Use Topics  . Smoking status: Former Smoker -- 2.50 packs/day for 25 years    Types: Cigarettes    Quit date: 02/23/1991  . Smokeless tobacco: None  . Alcohol Use: None    Review of Systems  Constitutional: Negative for fever, chills, diaphoresis, appetite change and fatigue.  HENT: Negative for mouth sores, sore throat and trouble swallowing.   Eyes: Negative for visual disturbance.  Respiratory: Positive for shortness of breath. Negative for cough, chest tightness and wheezing.   Cardiovascular: Positive for palpitations. Negative for  chest pain.  Gastrointestinal: Negative for nausea, vomiting, abdominal pain, diarrhea and abdominal distention.  Endocrine: Negative for polydipsia, polyphagia and polyuria.  Genitourinary: Negative for dysuria, frequency and hematuria.  Musculoskeletal: Negative for gait problem.  Skin: Negative for color change, pallor and rash.  Neurological: Negative for dizziness, syncope, light-headedness and headaches.  Hematological: Does not bruise/bleed easily.  Psychiatric/Behavioral: Negative for behavioral problems and confusion.      Allergies  Review of patient's allergies indicates no known allergies.  Home Medications   Prior to Admission medications   Medication Sig Start Date End Date Taking? Authorizing Provider  albuterol (PROVENTIL HFA;VENTOLIN HFA) 108 (90 BASE) MCG/ACT inhaler Inhale 2 puffs into the lungs every 6 (six) hours as needed for wheezing or shortness of breath.   Yes Historical Provider, MD  amLODipine (NORVASC) 10 MG tablet TAKE 1 TABLET (10 MG TOTAL) BY MOUTH DAILY. 03/20/15  Yes Tiffany A Gann, PA-C  aspirin 81 MG tablet Take 81 mg by mouth daily. occasionally   Yes Historical Provider, MD  furosemide (LASIX) 20 MG tablet TAKE 1 TABLET (20 MG TOTAL) BY MOUTH DAILY. 03/20/15  Yes Tiffany A Gann, PA-C  LORazepam (ATIVAN) 1 MG tablet Take 1 tablet (1 mg total) by mouth every 8 (  eight) hours as needed for anxiety. 03/20/15  Yes Tiffany A Gann, PA-C  metoprolol (LOPRESSOR) 50 MG tablet TAKE 1 TABLET (50 MG TOTAL) BY MOUTH DAILY. 03/20/15  Yes Tiffany A Gann, PA-C  pravastatin (PRAVACHOL) 40 MG tablet TAKE 1 TABLET (40 MG TOTAL) BY MOUTH DAILY. 03/20/15  Yes Tiffany A Gann, PA-C  traMADol (ULTRAM) 50 MG tablet TAKE 1 TABLET BY MOUTH EVERY TWELVE HOURS AS NEEDED FOR MODERATE PAIN 07/09/15  Yes Tiffany A Gann, PA-C  budesonide-formoterol (SYMBICORT) 160-4.5 MCG/ACT inhaler Inhale 2 puffs into the lungs 2 (two) times daily. 05/28/13   Ileana Ladd, MD   BP 122/78 mmHg  Pulse 119   Temp(Src) 97.7 F (36.5 C)  Resp 19  Ht 5\' 8"  (1.727 m)  Wt 235 lb (106.595 kg)  BMI 35.74 kg/m2  SpO2 98% Physical Exam  Constitutional: He is oriented to person, place, and time. He appears well-developed and well-nourished. No distress.  HENT:  Head: Normocephalic.  Eyes: Conjunctivae are normal. Pupils are equal, round, and reactive to light. No scleral icterus.  Neck: Normal range of motion. Neck supple. No thyromegaly present.  Cardiovascular: An irregularly irregular rhythm present. Tachycardia present.  Exam reveals no gallop and no friction rub.   No murmur heard. A. fib with RVR rate 130 on the monitor.  Pulmonary/Chest: Effort normal. No respiratory distress. He has wheezes. He has no rales.  Abdominal: Soft. Bowel sounds are normal. He exhibits no distension. There is no tenderness. There is no rebound.  Musculoskeletal: Normal range of motion.  Neurological: He is alert and oriented to person, place, and time.  Skin: Skin is warm and dry. No rash noted.  Psychiatric: He has a normal mood and affect. His behavior is normal.    ED Course  Procedures (including critical care time) Labs Review Labs Reviewed  CBC WITH DIFFERENTIAL/PLATELET - Abnormal; Notable for the following:    Hemoglobin 17.7 (*)    HCT 54.7 (*)    All other components within normal limits  BASIC METABOLIC PANEL - Abnormal; Notable for the following:    Chloride 100 (*)    CO2 33 (*)    Glucose, Bld 102 (*)    All other components within normal limits  TROPONIN I  BRAIN NATRIURETIC PEPTIDE    Imaging Review Dg Chest Port 1 View  08/01/2015   CLINICAL DATA:  Shortness of breath.  EXAM: PORTABLE CHEST - 1 VIEW  COMPARISON:  Mar 31, 2014.  FINDINGS: The heart size and mediastinal contours are within normal limits. Both lungs are clear. No pneumothorax or pleural effusion is noted. The visualized skeletal structures are unremarkable.  IMPRESSION: No acute cardiopulmonary abnormality seen.    Electronically Signed   By: Lupita Raider, M.D.   On: 08/01/2015 15:14   I have personally reviewed and evaluated these images and lab results as part of my medical decision-making.   EKG Interpretation   Date/Time:  Saturday August 01 2015 14:16:21 EDT Ventricular Rate:  113 PR Interval:    QRS Duration: 88 QT Interval:  304 QTC Calculation: 417 R Axis:   91 Text Interpretation:  Atrial fibrillation Ventricular premature complex  Right axis deviation Low voltage, extremity and precordial leads  Anteroseptal infarct, age indeterminate Baseline wander in lead(s) II  Confirmed by Fayrene Fearing  MD, Laylamarie Meuser ([id number]) on 08/01/2015 2:24:53 PM      MDM   Final diagnoses:  Atrial fibrillation with rapid ventricular response  COPD exacerbation    EKG confciral  EKG confirms atrial fibrillation with rapid ventricular response. Given bolus and infusion of Cardizem. Given nebulized albuterol and IV sterilely's. On recheck his dyspnea has improved. Heart is improved to 110. Clinically and radiographically does not show congestive heart failure. I discussed the case with Dr. Margot Ables. Patient be admitted to step down bed.  CRITICAL CARE Performed by: Rolland Porter JOSEPH  Total critical care time: 60 minutes including bolus administration of IV Cardizem infusion.  Critical care time was exclusive of separately billable procedures and treating other patients.  Critical care was necessary to treat or prevent imminent or life-threatening deterioration.  Critical care was time spent personally by me on the following activities: development of treatment plan with patient and/or surrogate as well as nursing, discussions with consultants, evaluation of patient's response to treatment, examination of patient, obtaining history from patient or surrogate, ordering and performing treatments and interventions, ordering and review of laboratory studies, ordering and review of radiographic studies, pulse oximetry  and re-evaluation of patient's condition.     Rolland Porter, MD 08/01/15 [phone]

## 2015-08-01 NOTE — H&P (Signed)
[facility] History and Physical  Tomasz Steeves ZOX:[id number] DOB: Nov 21, 1934 DOA: 08/01/2015  Referring physician: Dr Fayrene Fearing - [facility] PCP: Rudi Heap, MD   Chief Complaint: SOB  HPI: Logan West is a 81 y.o. male  Shortness of breath. Gradual onset 3 days ago. Constant. Getting worse. Patient is on 2 L nasal cannula at home. Increased his home O2 to 3 L. States that his resting oxygen recently has been no higher than 90% and 1 and relates it typically drops into the 80s and as low as 75%. He has used his home inhalers without improvement. Denies any palpitations, CP, or fevers but states that he has had a productive cough over this period of time. Patient states she's been under an extremely high amount of stress recently as his wife was recently admitted to [facility].     Review of Systems:  Constitutional:  No weight loss, night sweats, Fevers, chills,  HEENT:  No headaches, Difficulty swallowing,Tooth/dental problems,Sore throat,  Cardio-vascular:  No chest pain, Orthopnea, PND, swelling in lower extremities, anasarca, dizziness, GI:  No heartburn, indigestion, abdominal pain, nausea, vomiting, diarrhea, change in bowel habits, loss of appetite  Resp: Per HPI Skin:  no rash or lesions.  GU:  no dysuria, change in color of urine, no urgency or frequency. No flank pain.  Musculoskeletal:   No joint pain or swelling. No decreased range of motion. No back pain.  Psych:  No change in mood or affect. No depression or anxiety. No memory loss.   Past Medical History  Diagnosis Date  . Hypertension     x30 years  . Hyperlipidemia     x 7-8  . COPD (chronic obstructive pulmonary disease)   . Obesity   . Chronic pain    Past Surgical History  Procedure Laterality Date  . Percutaneous pinning      right forearm fracture  . Fracture surgery      right forearm   Social History:  reports that he quit smoking about 24 years ago. His smoking use included  Cigarettes. He has a 62.5 pack-year smoking history. He does not have any smokeless tobacco history on file. His alcohol and drug histories are not on file.  No Known Allergies  Family History  Problem Relation Age of Onset  . Cancer Mother   . Asthma Father   . Heart attack Brother     same mother.  MI in his 21s  . Coronary artery disease Other      Prior to Admission medications   Medication Sig Start Date End Date Taking? Authorizing Provider  albuterol (PROVENTIL HFA;VENTOLIN HFA) 108 (90 BASE) MCG/ACT inhaler Inhale 2 puffs into the lungs every 6 (six) hours as needed for wheezing or shortness of breath.   Yes Historical Provider, MD  amLODipine (NORVASC) 10 MG tablet TAKE 1 TABLET (10 MG TOTAL) BY MOUTH DAILY. 03/20/15  Yes Tiffany A Gann, PA-C  aspirin 81 MG tablet Take 81 mg by mouth daily. occasionally   Yes Historical Provider, MD  furosemide (LASIX) 20 MG tablet TAKE 1 TABLET (20 MG TOTAL) BY MOUTH DAILY. 03/20/15  Yes Tiffany A Gann, PA-C  LORazepam (ATIVAN) 1 MG tablet Take 1 tablet (1 mg total) by mouth every 8 (eight) hours as needed for anxiety. 03/20/15  Yes Tiffany A Gann, PA-C  metoprolol (LOPRESSOR) 50 MG tablet TAKE 1 TABLET (50 MG TOTAL) BY MOUTH DAILY. 03/20/15  Yes Tiffany A Gann, PA-C  pravastatin (PRAVACHOL) 40 MG tablet TAKE  1 TABLET (40 MG TOTAL) BY MOUTH DAILY. 03/20/15  Yes Tiffany A Gann, PA-C  traMADol (ULTRAM) 50 MG tablet TAKE 1 TABLET BY MOUTH EVERY TWELVE HOURS AS NEEDED FOR MODERATE PAIN 07/09/15  Yes Tiffany A Gann, PA-C  budesonide-formoterol (SYMBICORT) 160-4.5 MCG/ACT inhaler Inhale 2 puffs into the lungs 2 (two) times daily. 05/28/13   Ileana Ladd, MD   Physical Exam: Filed Vitals:   08/01/15 1530 08/01/15 1600 08/01/15 1630 08/01/15 1700  BP: 92/50 122/78 122/62 112/59  Pulse: 99 119 119 131  Temp:      Resp: 17 19 19 19   Height:      Weight:      SpO2: 100% 98% 96% 94%    Wt Readings from Last 3 Encounters:  08/01/15 106.595 kg (235 lb)    03/20/15 115.214 kg (254 lb)  10/02/14 115.486 kg (254 lb 9.6 oz)    General:  Appears anxious Eyes:  PERRL, sclera injected ENT: grossly normal hearing, lips & tongue Cardiovascular: Very difficult to appreciate heart sounds over respiratory sounds and COPD causing faint heart sounds at baseline. Irregular rate and rhythm. Trace  LE edema. Telemetry: A. Fib Respiratory: Diffuse rhonchi and wheezes with good air movement. On 3 L nasal cannula  Abdomen:  soft, ntnd Skin:  no rash or induration seen on limited exam Musculoskeletal:  grossly normal tone BUE/BLE Psychiatric:  grossly normal mood and affect, speech fluent and appropriate Neurologic:  grossly non-focal.          Labs on Admission:  Basic Metabolic Panel:  Recent Labs Lab 08/01/15 1451  NA 140  K 3.6  CL 100*  CO2 33*  GLUCOSE 102*  BUN 14  CREATININE 1.04  CALCIUM 9.2   Liver Function Tests: No results for input(s): AST, ALT, ALKPHOS, BILITOT, PROT, ALBUMIN in the last 168 hours. No results for input(s): LIPASE, AMYLASE in the last 168 hours. No results for input(s): AMMONIA in the last 168 hours. CBC:  Recent Labs Lab 08/01/15 1451  WBC 6.2  NEUTROABS 3.9  HGB 17.7*  HCT 54.7*  MCV 96.8  PLT 193   Cardiac Enzymes:  Recent Labs Lab 08/01/15 1451  TROPONINI <0.03    BNP (last 3 results)  Recent Labs  08/01/15 1451  BNP 75.0    ProBNP (last 3 results) No results for input(s): PROBNP in the last 8760 hours.  CBG: No results for input(s): GLUCAP in the last 168 hours.  Radiological Exams on Admission: Dg Chest Port 1 View  08/01/2015   CLINICAL DATA:  Shortness of breath.  EXAM: PORTABLE CHEST - 1 VIEW  COMPARISON:  Mar 31, 2014.  FINDINGS: The heart size and mediastinal contours are within normal limits. Both lungs are clear. No pneumothorax or pleural effusion is noted. The visualized skeletal structures are unremarkable.  IMPRESSION: No acute cardiopulmonary abnormality seen.    Electronically Signed   By: Lupita Raider, M.D.   On: 08/01/2015 15:14     Assessment/Plan Principal Problem:   Atrial fibrillation with rapid ventricular response Active Problems:   Essential hypertension   Anxiety   Chronic pain syndrome   HLD (hyperlipidemia)   Acute respiratory failure with hypoxemia   Chronic diastolic congestive heart failure    Afib w/ RVR: New diagnoses for patient. Likely secondary to acute physical stress from respiratory decompensation and significant anxiety and stress from patient's wife being in the hospital though this may have actually been present for some time as patient was unaware  of A. fib. Current complaints of palpitations only came on after receiving albuterol. Troponin negative. CHA2DS2VASc score of 5. HAS-BLED score 2. Pt may be appropriate for anticoagulation as outpt. - Stepdown - Cardizem drip - Telemetry - cardiology consult in a.m.   - heparin (Discussed long-term anticoagulation w/ pt and he's amenable if needed) - Will change home Metoprolol from tartrate to succinate   Acute respiratory distress: COPD exacerbation. Unlikely related to heart failure or acute infectious process such as pneumonia. Multiple nebulizer treatments and steroids in ED with some improvement. Still with new O2 requirement. BNP 75. WBC 6.2. - stepdown - xopenex, and ipratropium nebs - Solumedrol 60mg  Q6 - Doxy - ABG  HLD: - continue statin  HTN: normotensive - continue norvasc - change in  Metoprolol as above  Chronic diastolic congestive heart failure: Echo from 2012 shows EF of 55-60% with grade 1 diastolic dysfunction. No evidence of overload at this time. BNP 75. - Continue home Lasix  Code Status: FULL DVT Prophylaxis: Hep Family Communication: None Disposition Plan: Pending improvement     MERRELL, DAVID Shela Commons, MD Family Medicine [facility] [email or web address] Password [email or web address]

## 2015-08-01 NOTE — Progress Notes (Signed)
ANTICOAGULATION CONSULT NOTE - Initial Consult  Pharmacy Consult for Heparin Indication: atrial fibrillation  No Known Allergies  Patient Measurements: Height:  (172.7 cm) Weight: 235 lb (106.595 kg) IBW/kg (Calculated) : 68.4 HEPARIN DW (KG): 91.8  Vital Signs: Temp: 97.7 F (36.5 C) (09/10 1404) BP: 112/59 mmHg (09/10 1700) Pulse Rate: 131 (09/10 1700)  Labs:  Recent Labs  08/01/15 1451  HGB 17.7*  HCT 54.7*  PLT 193  CREATININE 1.04  TROPONINI <0.03    Estimated Creatinine Clearance: 68.2 mL/min (by C-G formula based on Cr of 1.04).   Medical History: Past Medical History  Diagnosis Date  . Hypertension     x30 years  . Hyperlipidemia     x 7-8  . COPD (chronic obstructive pulmonary disease)   . Obesity   . Chronic pain    Medications:   (Not in a hospital admission) Home Meds Reviewed.  Assessment: Okay for Protocol, baseline coags pending.  Goal of Therapy:  Heparin level 0.3-0.7 units/ml Monitor platelets by anticoagulation protocol: Yes   Plan:  Give 4000 units bolus x 1 Start heparin infusion at 1400 units/hr Check anti-Xa level in 6-8 hours and daily while on heparin Continue to monitor H&H and platelets  Mady Gemma 08/01/2015,5:43 PM

## 2015-08-02 ENCOUNTER — Inpatient Hospital Stay (HOSPITAL_COMMUNITY): Payer: Medicare Other

## 2015-08-02 DIAGNOSIS — I4891 Unspecified atrial fibrillation: Secondary | ICD-10-CM

## 2015-08-02 DIAGNOSIS — J9621 Acute and chronic respiratory failure with hypoxia: Secondary | ICD-10-CM

## 2015-08-02 DIAGNOSIS — F419 Anxiety disorder, unspecified: Secondary | ICD-10-CM

## 2015-08-02 DIAGNOSIS — I5032 Chronic diastolic (congestive) heart failure: Secondary | ICD-10-CM

## 2015-08-02 DIAGNOSIS — J441 Chronic obstructive pulmonary disease with (acute) exacerbation: Secondary | ICD-10-CM

## 2015-08-02 LAB — BLOOD GAS, ARTERIAL
ACID-BASE EXCESS: 7.2 mmol/L — AB (ref 0.0–2.0)
BICARBONATE: 30.1 meq/L — AB (ref 20.0–24.0)
DRAWN BY: 105551
O2 Content: 2 L/min
O2 Saturation: 94.8 %
PATIENT TEMPERATURE: 98.6
pCO2 arterial: 47.9 mmHg — ABNORMAL HIGH (ref 35.0–45.0)
pH, Arterial: 7.435 (ref 7.350–7.450)
pO2, Arterial: 70.4 mmHg — ABNORMAL LOW (ref 80.0–100.0)

## 2015-08-02 LAB — BASIC METABOLIC PANEL
ANION GAP: 8 (ref 5–15)
BUN: 18 mg/dL (ref 6–20)
CO2: 32 mmol/L (ref 22–32)
Calcium: 9.8 mg/dL (ref 8.9–10.3)
Chloride: 100 mmol/L — ABNORMAL LOW (ref 101–111)
Creatinine, Ser: 1.15 mg/dL (ref 0.61–1.24)
GFR, EST NON AFRICAN AMERICAN: 59 mL/min — AB (ref >60)
Glucose, Bld: 156 mg/dL — ABNORMAL HIGH (ref 65–99)
POTASSIUM: 4.3 mmol/L (ref 3.5–5.1)
SODIUM: 140 mmol/L (ref 135–145)

## 2015-08-02 LAB — CBC
HCT: 53.5 % — ABNORMAL HIGH (ref 39.0–52.0)
Hemoglobin: 17.5 g/dL — ABNORMAL HIGH (ref 13.0–17.0)
MCH: 31 pg (ref 26.0–34.0)
MCHC: 32.7 g/dL (ref 30.0–36.0)
MCV: 94.9 fL (ref 78.0–100.0)
PLATELETS: 196 10*3/uL (ref 150–400)
RBC: 5.64 MIL/uL (ref 4.22–5.81)
RDW: 13.6 % (ref 11.5–15.5)
WBC: 5.6 10*3/uL (ref 4.0–10.5)

## 2015-08-02 LAB — HEPARIN LEVEL (UNFRACTIONATED)
HEPARIN UNFRACTIONATED: 0.8 [IU]/mL — AB (ref 0.30–0.70)
HEPARIN UNFRACTIONATED: 0.77 [IU]/mL — AB (ref 0.30–0.70)

## 2015-08-02 MED ORDER — GUAIFENESIN ER 600 MG PO TB12
1200.0000 mg | ORAL_TABLET | Freq: Two times a day (BID) | ORAL | Status: DC
  Administered 2015-08-02 – 2015-08-04 (×5): 1200 mg via ORAL
  Filled 2015-08-02 (×5): qty 2

## 2015-08-02 MED ORDER — LEVALBUTEROL HCL 1.25 MG/0.5ML IN NEBU
1.2500 mg | INHALATION_SOLUTION | Freq: Four times a day (QID) | RESPIRATORY_TRACT | Status: DC
  Administered 2015-08-02 – 2015-08-04 (×5): 1.25 mg via RESPIRATORY_TRACT
  Filled 2015-08-02 (×6): qty 0.5

## 2015-08-02 MED ORDER — IPRATROPIUM BROMIDE 0.02 % IN SOLN
0.5000 mg | Freq: Four times a day (QID) | RESPIRATORY_TRACT | Status: DC
  Administered 2015-08-02 – 2015-08-04 (×5): 0.5 mg via RESPIRATORY_TRACT
  Filled 2015-08-02 (×6): qty 2.5

## 2015-08-02 NOTE — Progress Notes (Signed)
[facility] PROGRESS NOTE  Logan West WUJ:[id number] DOB: February 17, 1934 DOA: 08/01/2015 PCP: Rudi Heap, MD  Assessment/Plan: 1. Acute on chronic respiratory failure with hypoxia. Related to COPD exacerbation. Respiratory status appears to be improving and he is back down to 2 L. Continue to follow 2. COPD exacerbation. Patient still has evidence of wheezes. Will continue on bronchodilators, steroids and antibiotic. Continue pulmonary hygiene 3. Atrial fibrillation with rapid ventricular response. Likely reactive due to underlying pulmonary issues. Started on anticoagulation and Cardizem infusion. We'll try to wean off Cardizem as tolerated. Continue beta blockers. 4. Abdominal pain. Etiology unclear. Will check abdominal x-ray. 5. Chronic diastolic congestive heart failure. BNP of 75. Continue home dose of Lasix. 6. Hyperlipidemia. Continue statin 7. Hypertension. Blood pressures currently stable. Hold Norvasc since he is receiving Cardizem. 8. Anxiety. Continue Ativan.  Code Status: full code Family Communication: no family present Disposition Plan: discharge home once improved   Consultants:    Procedures:    Antibiotics:  Doxycycline 9/10>>  HPI/Subjective: Has productive cough with yellow sputum. Breathing is improving. Complains of left lower quadrant abdominal pain. Had some vomiting earlier today. Last bowel movement was yesterday  Objective: Filed Vitals:   08/02/15 0820  BP:   Pulse:   Temp: 97.7 F (36.5 C)  Resp:     Intake/Output Summary (Last 24 hours) at 08/02/15 0920 Last data filed at 08/02/15 0800  Gross per 24 hour  Intake 497.46 ml  Output    150 ml  Net 347.46 ml   Filed Weights   08/01/15 1404 08/01/15 1834 08/02/15 0500  Weight: 106.595 kg (235 lb) 108.6 kg (239 lb 6.7 oz) 108.7 kg (239 lb 10.2 oz)    Exam:   General:  NAD  Cardiovascular: s1, s2, irregular  Respiratory: bilateral rhonchi and wheezing  Abdomen: soft,  tender in left lower quadrant, bs+  Musculoskeletal: no edema in LE bilaterally  Data Reviewed: Basic Metabolic Panel:  Recent Labs Lab 08/01/15 1451 08/02/15 0540  NA 140 140  K 3.6 4.3  CL 100* 100*  CO2 33* 32  GLUCOSE 102* 156*  BUN 14 18  CREATININE 1.04 1.15  CALCIUM 9.2 9.8   Liver Function Tests: No results for input(s): AST, ALT, ALKPHOS, BILITOT, PROT, ALBUMIN in the last 168 hours. No results for input(s): LIPASE, AMYLASE in the last 168 hours. No results for input(s): AMMONIA in the last 168 hours. CBC:  Recent Labs Lab 08/01/15 1451 08/02/15 0540  WBC 6.2 5.6  NEUTROABS 3.9  --   HGB 17.7* 17.5*  HCT 54.7* 53.5*  MCV 96.8 94.9  PLT 193 196   Cardiac Enzymes:  Recent Labs Lab 08/01/15 1451  TROPONINI <0.03   BNP (last 3 results)  Recent Labs  08/01/15 1451  BNP 75.0    ProBNP (last 3 results) No results for input(s): PROBNP in the last 8760 hours.  CBG: No results for input(s): GLUCAP in the last 168 hours.  Recent Results (from the past 240 hour(s))  MRSA PCR Screening     Status: None   Collection Time: 08/01/15  6:00 PM  Result Value Ref Range Status   MRSA by PCR NEGATIVE NEGATIVE Final    Comment:        The GeneXpert MRSA Assay (FDA approved for NASAL specimens only), is one component of a comprehensive MRSA colonization surveillance program. It is not intended to diagnose MRSA infection nor to guide or monitor treatment for MRSA infections.      Studies: Dg  Chest Port 1 View  08/01/2015   CLINICAL DATA:  Shortness of breath.  EXAM: PORTABLE CHEST - 1 VIEW  COMPARISON:  Mar 31, 2014.  FINDINGS: The heart size and mediastinal contours are within normal limits. Both lungs are clear. No pneumothorax or pleural effusion is noted. The visualized skeletal structures are unremarkable.  IMPRESSION: No acute cardiopulmonary abnormality seen.   Electronically Signed   By: Lupita Raider, M.D.   On: 08/01/2015 15:14    Scheduled  Meds: . amLODipine  10 mg Oral Daily  . aspirin EC  81 mg Oral Daily  . doxycycline  100 mg Oral Q12H  . furosemide  20 mg Oral Daily  . ipratropium  0.5 mg Nebulization Q6H WA  . levalbuterol  1.25 mg Nebulization Q6H WA  . methylPREDNISolone (SOLU-MEDROL) injection  60 mg Intravenous Q6H  . metoprolol succinate  50 mg Oral Daily  . pravastatin  40 mg Oral Daily  . sodium chloride  3 mL Intravenous Q12H   Continuous Infusions: . diltiazem (CARDIZEM) infusion 10 mg/hr (08/02/15 0907)  . heparin 1,300 Units/hr (08/02/15 0906)    Principal Problem:   Atrial fibrillation with rapid ventricular response Active Problems:   Essential hypertension   Anxiety   Chronic pain syndrome   HLD (hyperlipidemia)   Acute respiratory failure with hypoxemia   Chronic diastolic congestive heart failure    Time spent:    [facility]  [facility] Pager [phone]. If 7PM-7AM, please contact night-coverage at [email or web address], password [facility] 08/02/2015, 9:20 AM  LOS: 1 day

## 2015-08-02 NOTE — Progress Notes (Signed)
ANTICOAGULATION CONSULT NOTE  Pharmacy Consult for Heparin Indication: atrial fibrillation  No Known Allergies  Patient Measurements: Height:  (172.7 cm) Weight: 239 lb 10.2 oz (108.7 kg) IBW/kg (Calculated) : 68.4 HEPARIN DW (KG): 92.4  Vital Signs: Temp: 98.4 F (36.9 C) (09/11 0400) Temp Source: Oral (09/11 0400) BP: 136/82 mmHg (09/11 0700) Pulse Rate: 95 (09/11 0700)  Labs:  Recent Labs  08/01/15 1451 08/02/15 0218 08/02/15 0540  HGB 17.7*  --  17.5*  HCT 54.7*  --  53.5*  PLT 193  --  196  LABPROT 13.5  --   --   INR 1.01  --   --   HEPARINUNFRC  --  0.80*  --   CREATININE 1.04  --  1.15  TROPONINI <0.03  --   --     Estimated Creatinine Clearance: 62.3 mL/min (by C-G formula based on Cr of 1.15).   Medical History: Past Medical History  Diagnosis Date  . Hypertension     x30 years  . Hyperlipidemia     x 7-8  . COPD (chronic obstructive pulmonary disease)   . Obesity   . Chronic pain    Medications:  Prescriptions prior to admission  Medication Sig Dispense Refill Last Dose  . albuterol (PROVENTIL HFA;VENTOLIN HFA) 108 (90 BASE) MCG/ACT inhaler Inhale 2 puffs into the lungs every 6 (six) hours as needed for wheezing or shortness of breath.   Past Month  . amLODipine (NORVASC) 10 MG tablet TAKE 1 TABLET (10 MG TOTAL) BY MOUTH DAILY. 30 tablet 5 07/31/2015  . aspirin 81 MG tablet Take 81 mg by mouth daily. occasionally   07/31/2015  . furosemide (LASIX) 20 MG tablet TAKE 1 TABLET (20 MG TOTAL) BY MOUTH DAILY. 30 tablet 5 07/31/2015  . LORazepam (ATIVAN) 1 MG tablet Take 1 tablet (1 mg total) by mouth every 8 (eight) hours as needed for anxiety. 60 tablet 4 08/01/2015  . metoprolol (LOPRESSOR) 50 MG tablet TAKE 1 TABLET (50 MG TOTAL) BY MOUTH DAILY. 30 tablet 5 07/31/2015 at 1630  . pravastatin (PRAVACHOL) 40 MG tablet TAKE 1 TABLET (40 MG TOTAL) BY MOUTH DAILY. 30 tablet 5 07/31/2015  . traMADol (ULTRAM) 50 MG tablet TAKE 1 TABLET BY MOUTH EVERY TWELVE  HOURS AS NEEDED FOR MODERATE PAIN 90 tablet 0 08/01/2015  . budesonide-formoterol (SYMBICORT) 160-4.5 MCG/ACT inhaler Inhale 2 puffs into the lungs 2 (two) times daily. 1 Inhaler 5 Taking    Assessment: Okay for Protocol, baseline WNL.  Heparin level slightly above goal this AM.  Goal of Therapy:  Heparin level 0.3-0.7 units/ml Monitor platelets by anticoagulation protocol: Yes   Plan:  Decreased heparin infusion to 1300 units/hr Re-check anti-Xa level in 6-8 hours and daily while on heparin Continue to monitor H&H and platelets  Logan West 08/02/2015,7:59 AM

## 2015-08-02 NOTE — Progress Notes (Signed)
PT IS VERY ANXIOUS TO GO HOME D/T TO WIFE BEING D/C'D FORM [place] YESTERDAY.HE IS VERY CONCERNED ABOUT WIFE. WIFE AND DAUGHTER AT VISITING AT BEDSIDE GIVING HIM MUCH ENCOURAGEMENT AND EMOTIONAL SUPPORT.

## 2015-08-02 NOTE — Progress Notes (Signed)
Patients results for abg were not collected until 0524 08/02/2015 because of blood gas lab machines were down. Specimen collected at 2100 9/10 had to be discarded due to machine being inoperable. Patient had been stuck twice by day shift and could not obtain blood.

## 2015-08-02 NOTE — Progress Notes (Signed)
Pt's home bottle of tramadol w/ 47 tablets sent to pharmacy to be locked up. Receipt is in the front of pt's chart. Pt is now in the process of having an echocardiogram  preformed.

## 2015-08-02 NOTE — Progress Notes (Signed)
  Echocardiogram 2D Echocardiogram has been performed.  Aris Everts 08/02/2015, 2:08 PM

## 2015-08-02 NOTE — Progress Notes (Signed)
PT GIVEN EDUCATIONAL INFORMATION ON ATRIAL FIBRILLATION, COPD EXACERBATION AND HYPERTENSION.

## 2015-08-02 NOTE — Progress Notes (Signed)
ANTICOAGULATION CONSULT NOTE  Pharmacy Consult for Heparin Indication: atrial fibrillation  No Known Allergies  Patient Measurements: Height:  (172.7 cm) Weight: 239 lb 10.2 oz (108.7 kg) IBW/kg (Calculated) : 68.4 HEPARIN DW (KG): 92.4  Vital Signs: Temp: 97.7 F (36.5 C) (09/11 0820) Temp Source: Oral (09/11 0820) BP: 130/84 mmHg (09/11 1400) Pulse Rate: 80 (09/11 1400)  Labs:  Recent Labs  08/01/15 1451 08/02/15 0218 08/02/15 0540 08/02/15 1411  HGB 17.7*  --  17.5*  --   HCT 54.7*  --  53.5*  --   PLT 193  --  196  --   LABPROT 13.5  --   --   --   INR 1.01  --   --   --   HEPARINUNFRC  --  0.80*  --  0.77*  CREATININE 1.04  --  1.15  --   TROPONINI <0.03  --   --   --     Estimated Creatinine Clearance: 62.3 mL/min (by C-G formula based on Cr of 1.15).   Medical History: Past Medical History  Diagnosis Date  . Hypertension     x30 years  . Hyperlipidemia     x 7-8  . COPD (chronic obstructive pulmonary disease)   . Obesity   . Chronic pain    Medications:  Prescriptions prior to admission  Medication Sig Dispense Refill Last Dose  . albuterol (PROVENTIL HFA;VENTOLIN HFA) 108 (90 BASE) MCG/ACT inhaler Inhale 2 puffs into the lungs every 6 (six) hours as needed for wheezing or shortness of breath.   Past Month  . amLODipine (NORVASC) 10 MG tablet TAKE 1 TABLET (10 MG TOTAL) BY MOUTH DAILY. 30 tablet 5 07/31/2015  . aspirin 81 MG tablet Take 81 mg by mouth daily. occasionally   07/31/2015  . furosemide (LASIX) 20 MG tablet TAKE 1 TABLET (20 MG TOTAL) BY MOUTH DAILY. 30 tablet 5 07/31/2015  . LORazepam (ATIVAN) 1 MG tablet Take 1 tablet (1 mg total) by mouth every 8 (eight) hours as needed for anxiety. 60 tablet 4 08/01/2015  . metoprolol (LOPRESSOR) 50 MG tablet TAKE 1 TABLET (50 MG TOTAL) BY MOUTH DAILY. 30 tablet 5 07/31/2015 at 1630  . pravastatin (PRAVACHOL) 40 MG tablet TAKE 1 TABLET (40 MG TOTAL) BY MOUTH DAILY. 30 tablet 5 07/31/2015  . traMADol  (ULTRAM) 50 MG tablet TAKE 1 TABLET BY MOUTH EVERY TWELVE HOURS AS NEEDED FOR MODERATE PAIN 90 tablet 0 08/01/2015  . budesonide-formoterol (SYMBICORT) 160-4.5 MCG/ACT inhaler Inhale 2 puffs into the lungs 2 (two) times daily. 1 Inhaler 5 Taking    Assessment: Okay for Protocol, baseline WNL.  Heparin level slightly above goal again this afternoon.  Goal of Therapy:  Heparin level 0.3-0.7 units/ml Monitor platelets by anticoagulation protocol: Yes   Plan:  Decreased heparin infusion to 1150 units/hr Re-check anti-Xa level daily while on heparin Continue to monitor H&H and platelets  Mady Gemma 08/02/2015,2:31 PM

## 2015-08-03 ENCOUNTER — Inpatient Hospital Stay (HOSPITAL_COMMUNITY): Payer: Medicare Other

## 2015-08-03 DIAGNOSIS — J9601 Acute respiratory failure with hypoxia: Secondary | ICD-10-CM

## 2015-08-03 DIAGNOSIS — I5042 Chronic combined systolic (congestive) and diastolic (congestive) heart failure: Secondary | ICD-10-CM

## 2015-08-03 LAB — CBC
HCT: 56.9 % — ABNORMAL HIGH (ref 39.0–52.0)
HEMOGLOBIN: 18.5 g/dL — AB (ref 13.0–17.0)
MCH: 31.4 pg (ref 26.0–34.0)
MCHC: 32.5 g/dL (ref 30.0–36.0)
MCV: 96.4 fL (ref 78.0–100.0)
PLATELETS: 192 10*3/uL (ref 150–400)
RBC: 5.9 MIL/uL — AB (ref 4.22–5.81)
RDW: 13.7 % (ref 11.5–15.5)
WBC: 11.2 10*3/uL — AB (ref 4.0–10.5)

## 2015-08-03 LAB — TSH: TSH: 0.602 u[IU]/mL (ref 0.350–4.500)

## 2015-08-03 LAB — HEPARIN LEVEL (UNFRACTIONATED): Heparin Unfractionated: 0.66 IU/mL (ref 0.30–0.70)

## 2015-08-03 MED ORDER — APIXABAN 5 MG PO TABS
5.0000 mg | ORAL_TABLET | Freq: Two times a day (BID) | ORAL | Status: DC
  Administered 2015-08-03 – 2015-08-04 (×3): 5 mg via ORAL
  Filled 2015-08-03 (×3): qty 1

## 2015-08-03 MED ORDER — METOPROLOL SUCCINATE ER 50 MG PO TB24: 75.0000 mg | ORAL_TABLET | Freq: Every day | ORAL | Status: DC

## 2015-08-03 MED ORDER — IOHEXOL 300 MG/ML  SOLN
100.0000 mL | Freq: Once | INTRAMUSCULAR | Status: AC | PRN
  Administered 2015-08-03: 100 mL via INTRAVENOUS

## 2015-08-03 MED ORDER — DILTIAZEM HCL 60 MG PO TABS: 120.0000 mg | ORAL_TABLET | Freq: Two times a day (BID) | ORAL | Status: DC

## 2015-08-03 MED ORDER — DILTIAZEM HCL ER 60 MG PO CP12
120.0000 mg | ORAL_CAPSULE | Freq: Two times a day (BID) | ORAL | Status: DC
  Administered 2015-08-03 – 2015-08-04 (×3): 120 mg via ORAL
  Filled 2015-08-03 (×5): qty 2

## 2015-08-03 MED ORDER — METOPROLOL SUCCINATE ER 50 MG PO TB24
50.0000 mg | ORAL_TABLET | Freq: Once | ORAL | Status: AC
  Administered 2015-08-03: 50 mg via ORAL
  Filled 2015-08-03: qty 1

## 2015-08-03 MED ORDER — SENNOSIDES-DOCUSATE SODIUM 8.6-50 MG PO TABS
2.0000 | ORAL_TABLET | Freq: Every evening | ORAL | Status: DC | PRN
  Administered 2015-08-03: 2 via ORAL
  Filled 2015-08-03: qty 2

## 2015-08-03 MED ORDER — METHYLPREDNISOLONE SODIUM SUCC 125 MG IJ SOLR
60.0000 mg | Freq: Three times a day (TID) | INTRAMUSCULAR | Status: DC
  Administered 2015-08-03 – 2015-08-04 (×3): 60 mg via INTRAVENOUS
  Filled 2015-08-03 (×3): qty 2

## 2015-08-03 MED ORDER — LISINOPRIL 5 MG PO TABS: 5.0000 mg | ORAL_TABLET | Freq: Every day | ORAL | Status: DC

## 2015-08-03 MED ORDER — BUDESONIDE-FORMOTEROL FUMARATE 160-4.5 MCG/ACT IN AERO
2.0000 | INHALATION_SPRAY | Freq: Two times a day (BID) | RESPIRATORY_TRACT | Status: DC
  Administered 2015-08-03 – 2015-08-04 (×3): 2 via RESPIRATORY_TRACT
  Filled 2015-08-03: qty 6

## 2015-08-03 MED ORDER — METOPROLOL SUCCINATE ER 25 MG PO TB24: 75.0000 mg | ORAL_TABLET | Freq: Once | ORAL | Status: DC

## 2015-08-03 MED ORDER — INFLUENZA VAC SPLIT QUAD 0.5 ML IM SUSY
0.5000 mL | PREFILLED_SYRINGE | INTRAMUSCULAR | Status: AC
  Administered 2015-08-04: 0.5 mL via INTRAMUSCULAR
  Filled 2015-08-03: qty 0.5

## 2015-08-03 NOTE — Progress Notes (Signed)
[facility] PROGRESS NOTE  Sajan Cheatwood WUJ:[id number] DOB: [birth year] DOA: 08/01/2015 PCP: Rudi Heap, MD  Assessment/Plan: 1. Acute on chronic respiratory failure with hypoxia. Chronically on 2-3 L. Related to COPD exacerbation. Respiratory status appears to be improving and he on 2 L. Reports breathing is back to baseline. Will continue to follow 2. COPD exacerbation. Patient still has evidence of wheezes. Will continue on bronchodilators, steroids and antibiotic. Will continue pulmonary hygiene. Start steroid tapers. 3. Atrial fibrillation with rapid ventricular response. Likely reactive due to underlying pulmonary issues. Started on anticoagulation and Cardizem infusion. Try to wean off Cardizem. Will continue beta blockers. 4. Abdominal pain. Etiology unclear. Abdominal x-ray reveals abnormal soft tissue interposed between central small bowel loops and the left colon within the left abdomen but shows no evidence of bowel obstruction. Will CT abdomen. 5. Chronic combined systolic and diastolic congestive heart failure. BNP of 75. EF 45-50%. Will continue home dose of Lasix. 6. Hyperlipidemia. Will continue statin 7. Hypertension. Blood pressures currently stable. Continue to hold Norvasc since he is receiving Cardizem. 8. Anxiety. Will continue Ativan.   Code Status: full code DVT Prophylaxis: Heparin Family Communication: Dicussed plan in detail. No further concerns at this time. Disposition Plan: Will transfer to medical Bed once he is off Cardizem infusion.   Consultants:  Cardiology  Procedures:  ECHO - Left ventricle: The cavity size was normal. Systolic function was mildly reduced. The estimated ejection fraction was in the range of 45% to 50%. Diffuse hypokinesis. There is akinesis of the inferoseptal myocardium. Doppler parameters are consistent with high ventricular filling pressure. - Mitral valve: Calcified annulus. - Right ventricle: The cavity size  was mildly dilated. Wall thickness was normal. - Right atrium: The atrium was mildly dilated. - Pulmonary arteries: Systolic pressure was moderately increased. PA peak pressure: 51 mm Hg (S).  Antibiotics:  Doxycycline 9/10>>  HPI/Subjective: Feeling better. Pt is normally on 2 L at home. Reports breathing back to baseline. Mild productive cough. Some abdominal pain. No BM today. No vomiting today.  Objective: Filed Vitals:   08/03/15 0400  BP:   Pulse:   Temp: 97.3 F (36.3 C)  Resp:     Intake/Output Summary (Last 24 hours) at 08/03/15 0634 Last data filed at 08/03/15 0500  Gross per 24 hour  Intake    927 ml  Output   1400 ml  Net   -473 ml   Filed Weights   08/01/15 1834 08/02/15 0500 08/03/15 0500  Weight: 108.6 kg (239 lb 6.7 oz) 108.7 kg (239 lb 10.2 oz) 109 kg (240 lb 4.8 oz)    Exam:   General:  NAD  Cardiovascular: s1, s2, irregular  Respiratory: bilateral rhonchi and mild expiratory wheezing  Abdomen: soft, tender in LLQ. Positive bowel sounds  Musculoskeletal: no BLE edema   Data Reviewed: Basic Metabolic Panel:  Recent Labs Lab 08/01/15 1451 08/02/15 0540  NA 140 140  K 3.6 4.3  CL 100* 100*  CO2 33* 32  GLUCOSE 102* 156*  BUN 14 18  CREATININE 1.04 1.15  CALCIUM 9.2 9.8   Liver Function Tests:  CBC:  Recent Labs Lab 08/01/15 1451 08/02/15 0540  WBC 6.2 5.6  NEUTROABS 3.9  --   HGB 17.7* 17.5*  HCT 54.7* 53.5*  MCV 96.8 94.9  PLT 193 196   Cardiac Enzymes:  Recent Labs Lab 08/01/15 1451  TROPONINI <0.03   BNP (last 3 results)  Recent Labs  08/01/15 1451  BNP 75.0  ProBNP (last 3 results)  CBG:   Recent Results (from the past 240 hour(s))  MRSA PCR Screening     Status: None   Collection Time: 08/01/15  6:00 PM  Result Value Ref Range Status   MRSA by PCR NEGATIVE NEGATIVE Final    Comment:        The GeneXpert MRSA Assay (FDA approved for NASAL specimens only), is one component of  a comprehensive MRSA colonization surveillance program. It is not intended to diagnose MRSA infection nor to guide or monitor treatment for MRSA infections.      Studies: Dg Abd 1 View  08/02/2015   CLINICAL DATA:  81 year old male with a history of left abdominal pain.  EXAM: ABDOMEN - 1 VIEW  COMPARISON:  None.  FINDINGS: There has been exclusion of portions of the right abdomen.  Gas within transverse colon and descending colon.  Central small bowel gas present. Soft tissue density interposed between central small bowel loops and the left colon.  No radiopaque foreign body.  Calcifications of the vasculature.  IMPRESSION: No evidence of bowel obstruction, however, there is abnormal soft tissue interposed between central small bowel loops and the left colon within the left abdomen of uncertain significance. Further evaluation with contrast-enhanced CT may be useful if there is concern for acute abdominal process.  Atherosclerosis.  Signed,  Yvone Neu. Loreta Ave, DO  [facility]  [facility]   Electronically Signed   By: Gilmer Mor D.O.   On: 08/02/2015 13:00   Dg Chest Port 1 View  08/01/2015   CLINICAL DATA:  Shortness of breath.  EXAM: PORTABLE CHEST - 1 VIEW  COMPARISON:  Mar 31, 2014.  FINDINGS: The heart size and mediastinal contours are within normal limits. Both lungs are clear. No pneumothorax or pleural effusion is noted. The visualized skeletal structures are unremarkable.  IMPRESSION: No acute cardiopulmonary abnormality seen.   Electronically Signed   By: Lupita Raider, M.D.   On: 08/01/2015 15:14    Scheduled Meds: . aspirin EC  81 mg Oral Daily  . doxycycline  100 mg Oral Q12H  . furosemide  20 mg Oral Daily  . guaiFENesin  1,200 mg Oral BID  . ipratropium  0.5 mg Nebulization Q6H WA  . levalbuterol  1.25 mg Nebulization Q6H WA  . methylPREDNISolone (SOLU-MEDROL) injection  60 mg Intravenous Q6H  . metoprolol succinate  50 mg  Oral Daily  . pravastatin  40 mg Oral Daily  . sodium chloride  3 mL Intravenous Q12H   Continuous Infusions: . diltiazem (CARDIZEM) infusion 5 mg/hr (08/03/15 0300)  . heparin 1,150 Units/hr (08/03/15 0353)    Principal Problem:   Atrial fibrillation with rapid ventricular response Active Problems:   Essential hypertension   Anxiety   Chronic pain syndrome   HLD (hyperlipidemia)   Acute respiratory failure with hypoxemia   Chronic diastolic congestive heart failure    Time spent: 30 mins  By signing my name below, I, Edman Circle attest that this documentation has been prepared under the direction and in the presence of Dr. Erick Blinks, M.D.   Electronically signed: Edman Circle  08/03/2015   Erick Blinks, M.D.  [facility] Pager [phone]. If 7PM-7AM, please contact night-coverage at [email or web address], password [facility] 08/03/2015, 6:34 AM  LOS: 2 days     I have reviewed the above documentation for accuracy and completeness, and I agree with the above.  MEMON,JEHANZEB

## 2015-08-03 NOTE — Progress Notes (Signed)
ANTICOAGULATION CONSULT NOTE  Pharmacy Consult for Heparin Indication: atrial fibrillation  No Known Allergies  Patient Measurements: Height:  (172.7 cm) Weight: 240 lb 4.8 oz (109 kg) IBW/kg (Calculated) : 68.4 HEPARIN DW (KG): 92.4  Vital Signs: Temp: 97.3 F (36.3 C) (09/12 0400) Temp Source: Oral (09/12 0400) BP: 144/71 mmHg (09/12 0645) Pulse Rate: 73 (09/12 0645)  Labs:  Recent Labs  08/01/15 1451 08/02/15 0218 08/02/15 0540 08/02/15 1411 08/03/15 0556  HGB 17.7*  --  17.5*  --  18.5*  HCT 54.7*  --  53.5*  --  56.9*  PLT 193  --  196  --  192  LABPROT 13.5  --   --   --   --   INR 1.01  --   --   --   --   HEPARINUNFRC  --  0.80*  --  0.77* 0.66  CREATININE 1.04  --  1.15  --   --   TROPONINI <0.03  --   --   --   --     Estimated Creatinine Clearance: 62.3 mL/min (by C-G formula based on Cr of 1.15).   Medical History: Past Medical History  Diagnosis Date  . Hypertension     x30 years  . Hyperlipidemia     x 7-8  . COPD (chronic obstructive pulmonary disease)   . Obesity   . Chronic pain    Medications:  Prescriptions prior to admission  Medication Sig Dispense Refill Last Dose  . albuterol (PROVENTIL HFA;VENTOLIN HFA) 108 (90 BASE) MCG/ACT inhaler Inhale 2 puffs into the lungs every 6 (six) hours as needed for wheezing or shortness of breath.   Past Month  . amLODipine (NORVASC) 10 MG tablet TAKE 1 TABLET (10 MG TOTAL) BY MOUTH DAILY. 30 tablet 5 07/31/2015  . aspirin 81 MG tablet Take 81 mg by mouth daily. occasionally   07/31/2015  . furosemide (LASIX) 20 MG tablet TAKE 1 TABLET (20 MG TOTAL) BY MOUTH DAILY. 30 tablet 5 07/31/2015  . LORazepam (ATIVAN) 1 MG tablet Take 1 tablet (1 mg total) by mouth every 8 (eight) hours as needed for anxiety. 60 tablet 4 08/01/2015  . metoprolol (LOPRESSOR) 50 MG tablet TAKE 1 TABLET (50 MG TOTAL) BY MOUTH DAILY. 30 tablet 5 07/31/2015 at 1630  . pravastatin (PRAVACHOL) 40 MG tablet TAKE 1 TABLET (40 MG TOTAL) BY  MOUTH DAILY. 30 tablet 5 07/31/2015  . traMADol (ULTRAM) 50 MG tablet TAKE 1 TABLET BY MOUTH EVERY TWELVE HOURS AS NEEDED FOR MODERATE PAIN 90 tablet 0 08/01/2015  . budesonide-formoterol (SYMBICORT) 160-4.5 MCG/ACT inhaler Inhale 2 puffs into the lungs 2 (two) times daily. 1 Inhaler 5 Taking    Assessment: Okay for Protocol, baseline WNL.  Heparin level is now at goal.  CBC stable.  Goal of Therapy:  Heparin level 0.3-0.7 units/ml Monitor platelets by anticoagulation protocol: Yes   Plan:  Continue heparin infusion at 1150 units/hr Re-check anti-Xa level daily while on heparin Continue to monitor H&H and platelets  Margo Aye, Charmion Hapke A 08/03/2015,7:41 AM

## 2015-08-03 NOTE — Progress Notes (Signed)
Report called to L. Bullins, Charity fundraiser. Patient to be transferred to 301 after CT scan is complete.

## 2015-08-03 NOTE — Consult Note (Signed)
CARDIOLOGY CONSULT NOTE   Patient ID: Logan West MRN: [id number] DOB/AGE: 1934-10-09 81 y.o.  Admit Date: 08/01/2015 Referring Physician: PTHKerry Hough MD Primary Physician: Rudi Heap, MD Consulting Cardiologist: Charlton Haws MD Primary Cardiologist Sarina Ser MD Reason for Consultation: Atrial fib with RVR   Clinical Summary Logan West is a 81 y.o.male HTN, hyperlipidemia, COPD oxygen dependent, with a history of premature atrial contractions. He has not been seen by cardiology since 2012. An echo and stress test was completed at that time which were found to be normal. He was referred back to his PCP.   He was admitted with increasing dyspnea and productive coughing via the ER on 08/01/2015. He waited to seek medical attention as he wife was hospitalized at [facility] for pneumonia and COPD. She returned home the day he came to ER. He was found to have atrial fib with RVR on initial EKG rate of 113 bpm. He was slightly hypertensive with BP of 152/82.   Labs were essentially normal with the exception of low normal potassium of 3.6 and CO2 of 33. .CXR did not demonstrate cardiopulmonary abnormalities. He was treated with albuterol, IV steroids, and given a diltiazem bolus with a gtt initiated. He was started on ASA and heparin. Marland KitchenHe is currently on 5 mg/hr.   Echo demonstrated reduction of LVEF to 45%-50% with elevated pulmonary pressure of 51 mmHg, as compared to echo completed in 2012 which demonstrated EF of 60-65%, grade I diastolic dysfunction and normal RV size..Pulmary pressure could not be measured at that time.   No Known Allergies  Medications Scheduled Medications: . aspirin EC  81 mg Oral Daily  . budesonide-formoterol  2 puff Inhalation BID  . doxycycline  100 mg Oral Q12H  . furosemide  20 mg Oral Daily  . guaiFENesin  1,200 mg Oral BID  . ipratropium  0.5 mg Nebulization Q6H WA  . levalbuterol  1.25 mg Nebulization Q6H WA  . methylPREDNISolone (SOLU-MEDROL)  injection  60 mg Intravenous 3 times per day  . metoprolol succinate  50 mg Oral Daily  . pravastatin  40 mg Oral Daily  . sodium chloride  3 mL Intravenous Q12H    Infusions: . diltiazem (CARDIZEM) infusion 5 mg/hr (08/03/15 0600)  . heparin 1,150 Units/hr (08/03/15 0600)    PRN Medications: acetaminophen **OR** acetaminophen, LORazepam, ondansetron **OR** ondansetron (ZOFRAN) IV, traMADol   Past Medical History  Diagnosis Date  . Hypertension     x30 years  . Hyperlipidemia     x 7-8  . COPD (chronic obstructive pulmonary disease)   . Obesity   . Chronic pain     Past Surgical History  Procedure Laterality Date  . Percutaneous pinning      right forearm fracture  . Fracture surgery      right forearm    Family History  Problem Relation Age of Onset  . Cancer Mother   . Asthma Father   . Heart attack Brother     same mother.  MI in his 84s  . Coronary artery disease Other     Social History Logan West reports that he quit smoking about 24 years ago. His smoking use included Cigarettes. He has a 62.5 pack-year smoking history. He does not have any smokeless tobacco history on file. Logan West has no alcohol history on file.  Review of Systems Complete review of systems are found to be negative unless outlined in H&P above.  Physical Examination Blood pressure 144/71, pulse 73, temperature  97.3 F (36.3 C), temperature source Oral, resp. rate 23, height  (1.727 m), weight 240 lb 4.8 oz (109 kg), SpO2 94 %.  Intake/Output Summary (Last 24 hours) at 08/03/15 0925 Last data filed at 08/03/15 0600  Gross per 24 hour  Intake    937 ml  Output    950 ml  Net    -13 ml    Telemetry: Atrial fib in the 80's.   GEN: Ill appearing.  HEENT: Conjunctiva injected, and lids normal, oropharynx clear with moist mucosa. Neck: Supple, no elevated JVP or carotid bruits, no thyromegaly. Lungs: Mild inspiratory wheezes in the upper airways, cleared with coughing.    Cardiac: Irregular rate and rhythm, no S3 or significant systolic murmur, no pericardial rub. Abdomen: Soft, nontender, no hepatomegaly, bowel sounds present, no guarding or rebound. Extremities: No pitting edema, distal pulses 2+. Skin: Warm and dry. Musculoskeletal: No kyphosis. Neuropsychiatric: Alert and oriented x3, affect grossly appropriate.  Prior Cardiac Testing/Procedures 1.Echocardiogram 08/02/2015  Left ventricle: The cavity size was normal. Systolic function was mildly reduced. The estimated ejection fraction was in the range of 45% to 50%. Diffuse hypokinesis. There is akinesis of the inferoseptal myocardium. Doppler parameters are consistent with high ventricular filling pressure. - Mitral valve: Calcified annulus. - Right ventricle: The cavity size was mildly dilated. Wall thickness was normal. - Right atrium: The atrium was mildly dilated. - Pulmonary arteries: Systolic pressure was moderately increased. PA peak pressure: 51 mm Hg (S).  Lab Results  Basic Metabolic Panel:  Recent Labs Lab 08/01/15 1451 08/02/15 0540  NA 140 140  K 3.6 4.3  CL 100* 100*  CO2 33* 32  GLUCOSE 102* 156*  BUN 14 18  CREATININE 1.04 1.15  CALCIUM 9.2 9.8    CBC:  Recent Labs Lab 08/01/15 1451 08/02/15 0540 08/03/15 0556  WBC 6.2 5.6 11.2*  NEUTROABS 3.9  --   --   HGB 17.7* 17.5* 18.5*  HCT 54.7* 53.5* 56.9*  MCV 96.8 94.9 96.4  PLT 193 196 192    Cardiac Enzymes:  Recent Labs Lab 08/01/15 1451  TROPONINI <0.03    Radiology: Dg Abd 1 View  08/02/2015   CLINICAL DATA:  81 year old male with a history of left abdominal pain.  EXAM: ABDOMEN - 1 VIEW  COMPARISON:  None.  FINDINGS: There has been exclusion of portions of the right abdomen.  Gas within transverse colon and descending colon.  Central small bowel gas present. Soft tissue density interposed between central small bowel loops and the left colon.  No radiopaque foreign body.  Calcifications  of the vasculature.  IMPRESSION: No evidence of bowel obstruction, however, there is abnormal soft tissue interposed between central small bowel loops and the left colon within the left abdomen of uncertain significance. Further evaluation with contrast-enhanced CT may be useful if there is concern for acute abdominal process.  Atherosclerosis.  Signed,  Yvone Neu. Loreta Ave, DO  [facility]  [facility]   Electronically Signed   By: Gilmer Mor D.O.   On: 08/02/2015 13:00   Dg Chest Port 1 View  08/01/2015   CLINICAL DATA:  Shortness of breath.  EXAM: PORTABLE CHEST - 1 VIEW  COMPARISON:  Mar 31, 2014.  FINDINGS: The heart size and mediastinal contours are within normal limits. Both lungs are clear. No pneumothorax or pleural effusion is noted. The visualized skeletal structures are unremarkable.  IMPRESSION: No acute cardiopulmonary abnormality seen.   Electronically Signed   By:  Lupita Raider, M.D.   On: 08/01/2015 15:14     ECG: Atrial fib rate of 113 bpm.    Impression and Recommendations  1. New Onset Atrial fib: Likely related to COPD exacerbation. EF per echo demonstrated reduced EF of 45%-50%. Would no use CCB in this setting to avoid CHF. Will d/c diltiazem gtt. Options are limited with COPD and therefore amiodarone is not efficacious.Marland Kitchen He is currently on metoprolol 50 mg daily. Will change to po diltazem 120 mg BID. Anticoagulation is recommended. CHADS VASC Score of 4. Will begin Eliquis 5 mg BID.  He is anxious to return home to look after his wife.   2. Systolic Dysfunction: Can consider right and left heart cath once he is more stable. He does not have evidence of CHF on assessment this am.  He wishes to follow up with Dr. Antoine Poche in [place] but will come to [facility] if necessary for closer follow up time. CVRF include hypertension, age, male, FH and hypercholesterolemia. Last stress test in 2012 was normal per Dr. Jenene Slicker office  note.   3. Hypertension: BP is slightly elevated.   4. COPD exacerbation: Followed by PTH   Signed: Bettey Mare. Lawrence NP [facility]  08/03/2015, 9:25 AM Co-Sign MD  Patient examined chart reviewed.  New onset afib.  No contraindications to anticoagulation agree with starting on Eliquis.  EF only mildly decreased to low normal ok to use cardizem with metoprolol for rate control and BP.   Needs baseline ABG and further w/u for elevated Hct if not from hypoxia.  No neurologic symptoms but at Hct 56  Will need to be monitored for possible phlebotomy needs.  If not really that hypoxic on ABG consider w/u P vera with RBC mass.    Charlton Haws

## 2015-08-04 DIAGNOSIS — J9621 Acute and chronic respiratory failure with hypoxia: Secondary | ICD-10-CM | POA: Diagnosis not present

## 2015-08-04 DIAGNOSIS — Z7951 Long term (current) use of inhaled steroids: Secondary | ICD-10-CM | POA: Diagnosis not present

## 2015-08-04 DIAGNOSIS — I10 Essential (primary) hypertension: Secondary | ICD-10-CM | POA: Diagnosis not present

## 2015-08-04 DIAGNOSIS — E785 Hyperlipidemia, unspecified: Secondary | ICD-10-CM | POA: Diagnosis not present

## 2015-08-04 DIAGNOSIS — I4891 Unspecified atrial fibrillation: Secondary | ICD-10-CM | POA: Diagnosis not present

## 2015-08-04 DIAGNOSIS — Z809 Family history of malignant neoplasm, unspecified: Secondary | ICD-10-CM | POA: Diagnosis not present

## 2015-08-04 DIAGNOSIS — Z87891 Personal history of nicotine dependence: Secondary | ICD-10-CM | POA: Diagnosis not present

## 2015-08-04 DIAGNOSIS — Z825 Family history of asthma and other chronic lower respiratory diseases: Secondary | ICD-10-CM | POA: Diagnosis not present

## 2015-08-04 DIAGNOSIS — Z7982 Long term (current) use of aspirin: Secondary | ICD-10-CM | POA: Diagnosis not present

## 2015-08-04 DIAGNOSIS — Z8249 Family history of ischemic heart disease and other diseases of the circulatory system: Secondary | ICD-10-CM | POA: Diagnosis not present

## 2015-08-04 DIAGNOSIS — Z9981 Dependence on supplemental oxygen: Secondary | ICD-10-CM | POA: Diagnosis not present

## 2015-08-04 DIAGNOSIS — J441 Chronic obstructive pulmonary disease with (acute) exacerbation: Secondary | ICD-10-CM | POA: Diagnosis not present

## 2015-08-04 DIAGNOSIS — G894 Chronic pain syndrome: Secondary | ICD-10-CM | POA: Diagnosis not present

## 2015-08-04 DIAGNOSIS — I5042 Chronic combined systolic (congestive) and diastolic (congestive) heart failure: Secondary | ICD-10-CM | POA: Diagnosis not present

## 2015-08-04 LAB — CBC
HEMATOCRIT: 53.9 % — AB (ref 39.0–52.0)
HEMOGLOBIN: 17.4 g/dL — AB (ref 13.0–17.0)
MCH: 30.9 pg (ref 26.0–34.0)
MCHC: 32.3 g/dL (ref 30.0–36.0)
MCV: 95.6 fL (ref 78.0–100.0)
Platelets: 217 10*3/uL (ref 150–400)
RBC: 5.64 MIL/uL (ref 4.22–5.81)
RDW: 13.7 % (ref 11.5–15.5)
WBC: 11.7 10*3/uL — ABNORMAL HIGH (ref 4.0–10.5)

## 2015-08-04 LAB — BASIC METABOLIC PANEL
Anion gap: 9 (ref 5–15)
BUN: 34 mg/dL — AB (ref 6–20)
CHLORIDE: 96 mmol/L — AB (ref 101–111)
CO2: 35 mmol/L — ABNORMAL HIGH (ref 22–32)
Calcium: 9.7 mg/dL (ref 8.9–10.3)
Creatinine, Ser: 0.98 mg/dL (ref 0.61–1.24)
GFR calc Af Amer: >60 mL/min (ref >60)
GFR calc non Af Amer: >60 mL/min (ref >60)
GLUCOSE: 114 mg/dL — AB (ref 65–99)
POTASSIUM: 4.8 mmol/L (ref 3.5–5.1)
Sodium: 140 mmol/L (ref 135–145)

## 2015-08-04 MED ORDER — GUAIFENESIN ER 600 MG PO TB12: 1200.0000 mg | ORAL_TABLET | Freq: Two times a day (BID) | ORAL | Status: DC

## 2015-08-04 MED ORDER — DOXYCYCLINE HYCLATE 100 MG PO TABS: 100.0000 mg | ORAL_TABLET | Freq: Two times a day (BID) | ORAL | Status: DC

## 2015-08-04 MED ORDER — PREDNISONE 10 MG PO TABS: ORAL_TABLET | ORAL | Status: DC

## 2015-08-04 MED ORDER — DILTIAZEM HCL ER 120 MG PO CP12
120.0000 mg | ORAL_CAPSULE | Freq: Two times a day (BID) | ORAL | Status: DC
Start: 2015-08-04 — End: 2015-09-09

## 2015-08-04 MED ORDER — ALBUTEROL SULFATE (2.5 MG/3ML) 0.083% IN NEBU: 2.5000 mg | INHALATION_SOLUTION | Freq: Four times a day (QID) | RESPIRATORY_TRACT | Status: DC | PRN

## 2015-08-04 MED ORDER — APIXABAN 5 MG PO TABS: 5.0000 mg | ORAL_TABLET | Freq: Two times a day (BID) | ORAL | Status: DC

## 2015-08-04 NOTE — Progress Notes (Signed)
Discharge instructions given, verbalized understanding, out in stable condition with oxygen via w/c with staff. 

## 2015-08-04 NOTE — Care Management Note (Signed)
Case Management Note  Patient Details  Name: Logan West MRN: [id number] Date of Birth: November 10, 1934   Expected Discharge Date:  08/04/15               Expected Discharge Plan:  Home/Self Care  In-House Referral:  NA  Discharge planning Services  CM Consult  Post Acute Care Choice:  NA Choice offered to:  NA  DME Arranged:    DME Agency:     HH Arranged:    HH Agency:     Status of Service:  Completed, signed off  Medicare Important Message Given:  Yes-second notification given Date Medicare IM Given:    Medicare IM give by:    Date Additional Medicare IM Given:    Additional Medicare Important Message give by:     If discussed at Long Length of Stay Meetings, dates discussed:    Additional Comments: Pt admitted with new onset Afib. Pt is from home, lives with his wife. Pt independent with ADL's. Pt has home O2 for 24/7 use. Pt plans to return home with self care at discharge today. Benefits check completed for Eliquis and pt given voucher for first 30 days free. Pt's co-pay will be $45/month, pt says she can afford this. Pre-auth is required after use of 30-day voucher and cardiologist made aware. No further CM needs.  Malcolm Metro, RN 08/04/2015, 10:13 AM

## 2015-08-04 NOTE — Care Management Important Message (Signed)
Important Message  Patient Details  Name: Logan West MRN: [id number] Date of Birth: 04-Aug-1934   Medicare Important Message Given:  Yes-second notification given    Malcolm Metro, RN 08/04/2015, 10:10 AM

## 2015-08-04 NOTE — Discharge Summary (Signed)
Physician Discharge Summary  Logan West ZOX:[id number] DOB: [birth year] DOA: 08/01/2015  PCP: Rudi Heap, MD  Admit date: 08/01/2015 Discharge date: 08/04/2015  Time spent: 35 minutes  Recommendations for Outpatient Follow-up:  1. Follow up with PCP in 1-2 weeks for resolution of respiratory failure. 2. Follow up with cardiology as outpatient for recommended right and left cardiac cath.  Discharge Diagnoses:  Principal Problem:   Atrial fibrillation with rapid ventricular response Active Problems:   Essential hypertension   Anxiety   Chronic pain syndrome   HLD (hyperlipidemia)   Acute on chronic respiratory failure with hypoxemia   Chronic combined systolic and diastolic congestive heart failure   COPD exacerbation   Discharge Condition: Improved  Diet recommendation: Heart healthy   Filed Weights   08/01/15 1834 08/02/15 0500 08/03/15 0500  Weight: 108.6 kg (239 lb 6.7 oz) 108.7 kg (239 lb 10.2 oz) 109 kg (240 lb 4.8 oz)    History of present illness:  81 y.o. male presented with complaints of increasing shortness of breath and abdominal pain. While in the ED he was found to be hypoxic. Was admitted for further evaluation and monitoring.   Hospital Course:  Acute on chronic respiratory failure with hypoxia which believed to be related to COPD exacerbation has resolved. Was able to wean oxygen to his baseline of 2-3 L. He reported his breathing is back to baseline, and he was able to ambulate without difficulty. He feels he is better and wants to be discharged.  COPD exacerbation improved. Wheezing and coughing greatly improved with bronchodilators, steroid tapers, and antibiotic. Upon discharge he will continue steroid tapers and complete a course of abx. 1. Atrial fibrillation with rapid ventricular response. Rate controlled. Likely reactive due to underlying pulmonary issues. Cardiology consulted and started patient on PO Diltazem 120mg  BID and Eliquis 5mg  BID. Beta  blockers were continued. HR is controlled follow up with cardiology. 2. Abdominal pain, resolved possibly related to constipation. Abdominal x-ray revealed abnormal soft tissue interposed between central small bowel loops. Abdominal CT reveals no acute findings. Pt is not having further abdominal pain and is moving his bowels.  3. Chronic combined systolic and diastolic congestive heart failure compensated. EF 45-50%. Continue home dose of Lasix. Cardiology recommends right and left cardiac cath once he is more stable. Since he does not have evidence of CHF at this moment, he can follow up as outpatient.  4. Hyperlipidemia. Continue statin. 5. Hypertension. Blood pressures currently stable.  6. Anxiety. Continue Ativan.  Procedures:  None  Consultations:  Cardiology  Discharge Exam: Filed Vitals:   08/04/15 0538  BP: 117/65  Pulse: 80  Temp: 98 F (36.7 C)  Resp: 20   General: NAD, looks comfortable. Sitting in chair. Cardiovascular: irregular rhythm and normal rate  Respiratory: Lungs sounds diminished with mild expiratory wheezes  Abdomen: soft, non tender, no distention , bowel sounds normal Musculoskeletal: Trace edema bilaterally  Discharge Instructions    Current Discharge Medication List    START taking these medications   Details  albuterol (PROVENTIL) (2.5 MG/3ML) 0.083% nebulizer solution Take 3 mLs (2.5 mg total) by nebulization every 6 (six) hours as needed for wheezing or shortness of breath. Qty: 75 mL, Refills: 12    apixaban (ELIQUIS) 5 MG TABS tablet Take 1 tablet (5 mg total) by mouth 2 (two) times daily. Qty: 60 tablet, Refills: 1    diltiazem (CARDIZEM SR) 120 MG 12 hr capsule Take 1 capsule (120 mg total) by mouth every 12 (twelve) hours.  Qty: 60 capsule, Refills: 1    doxycycline (VIBRA-TABS) 100 MG tablet Take 1 tablet (100 mg total) by mouth every 12 (twelve) hours. Qty: 6 tablet, Refills: 0    guaiFENesin (MUCINEX) 600 MG 12 hr tablet Take  2 tablets (1,200 mg total) by mouth 2 (two) times daily. Qty: 20 tablet, Refills: 1    predniSONE (DELTASONE) 10 MG tablet take 40mg  po daily for 2 days then 30mg  daily for 2 days then 20mg  daily for 2 days then 10mg  daily for 2 days then stop Qty: 20 tablet, Refills: 0      CONTINUE these medications which have NOT CHANGED   Details  albuterol (PROVENTIL HFA;VENTOLIN HFA) 108 (90 BASE) MCG/ACT inhaler Inhale 2 puffs into the lungs every 6 (six) hours as needed for wheezing or shortness of breath.    furosemide (LASIX) 20 MG tablet TAKE 1 TABLET (20 MG TOTAL) BY MOUTH DAILY. Qty: 30 tablet, Refills: 5   Associated Diagnoses: Essential hypertension, benign    LORazepam (ATIVAN) 1 MG tablet Take 1 tablet (1 mg total) by mouth every 8 (eight) hours as needed for anxiety. Qty: 60 tablet, Refills: 4   Associated Diagnoses: Anxiety state    metoprolol (LOPRESSOR) 50 MG tablet TAKE 1 TABLET (50 MG TOTAL) BY MOUTH DAILY. Qty: 30 tablet, Refills: 5   Associated Diagnoses: Essential hypertension, benign    pravastatin (PRAVACHOL) 40 MG tablet TAKE 1 TABLET (40 MG TOTAL) BY MOUTH DAILY. Qty: 30 tablet, Refills: 5   Associated Diagnoses: Hyperlipemia    traMADol (ULTRAM) 50 MG tablet TAKE 1 TABLET BY MOUTH EVERY TWELVE HOURS AS NEEDED FOR MODERATE PAIN Qty: 90 tablet, Refills: 0    budesonide-formoterol (SYMBICORT) 160-4.5 MCG/ACT inhaler Inhale 2 puffs into the lungs 2 (two) times daily. Qty: 1 Inhaler, Refills: 5   Associated Diagnoses: Other and unspecified hyperlipidemia; HTN (hypertension)      STOP taking these medications     amLODipine (NORVASC) 10 MG tablet      aspirin 81 MG tablet        No Known Allergies    The results of significant diagnostics from this hospitalization (including imaging, microbiology, ancillary and laboratory) are listed below for reference.    Significant Diagnostic Studies: Dg Abd 1 View  08/02/2015   CLINICAL DATA:  81 year old male with a  history of left abdominal pain.  EXAM: ABDOMEN - 1 VIEW  COMPARISON:  None.  FINDINGS: There has been exclusion of portions of the right abdomen.  Gas within transverse colon and descending colon.  Central small bowel gas present. Soft tissue density interposed between central small bowel loops and the left colon.  No radiopaque foreign body.  Calcifications of the vasculature.  IMPRESSION: No evidence of bowel obstruction, however, there is abnormal soft tissue interposed between central small bowel loops and the left colon within the left abdomen of uncertain significance. Further evaluation with contrast-enhanced CT may be useful if there is concern for acute abdominal process.  Atherosclerosis.  Signed,  Yvone Neu. Loreta Ave, DO  [facility]  [facility]   Electronically Signed   By: Gilmer Mor D.O.   On: 08/02/2015 13:00   Ct Abdomen Pelvis W Contrast  08/03/2015   CLINICAL DATA:  Abdominal pain since 08/01/2015. Abnormal plain film.  EXAM: CT ABDOMEN AND PELVIS WITH CONTRAST  TECHNIQUE: Multidetector CT imaging of the abdomen and pelvis was performed using the standard protocol following bolus administration of intravenous contrast.  CONTRAST:   OMNIPAQUE IOHEXOL 300 MG/ML  SOLN  COMPARISON:  Plain films 08/02/2015  FINDINGS: Linear areas of atelectasis in the lung bases. No effusions. Heart is normal size.  Mild fatty infiltration of the liver. High-density material layers within the gallbladder, likely a small stones. Spleen, pancreas, adrenals and kidneys are normal. Aorta and iliac vessels are calcified, non aneurysmal. Urinary bladder is unremarkable.  Appendix is visualized and is normal. Stomach, large and small bowel unremarkable. No free fluid, free air or adenopathy.  No mass in the left abdomen as suggested by soft tissue density on prior plain films.  No acute bony abnormality or focal bone lesion.  IMPRESSION: No acute findings in the abdomen or  pelvis.  Mild fatty infiltration of the liver.  Probable layering small stones in the gallbladder.  Areas of atelectasis in the lung bases bilaterally.   Electronically Signed   By: Charlett Nose M.D.   On: 08/03/2015 15:09   Dg Chest Port 1 View  08/01/2015   CLINICAL DATA:  Shortness of breath.  EXAM: PORTABLE CHEST - 1 VIEW  COMPARISON:  Mar 31, 2014.  FINDINGS: The heart size and mediastinal contours are within normal limits. Both lungs are clear. No pneumothorax or pleural effusion is noted. The visualized skeletal structures are unremarkable.  IMPRESSION: No acute cardiopulmonary abnormality seen.   Electronically Signed   By: Lupita Raider, M.D.   On: 08/01/2015 15:14    Microbiology: Recent Results (from the past 240 hour(s))  MRSA PCR Screening     Status: None   Collection Time: 08/01/15  6:00 PM  Result Value Ref Range Status   MRSA by PCR NEGATIVE NEGATIVE Final    Comment:        The GeneXpert MRSA Assay (FDA approved for NASAL specimens only), is one component of a comprehensive MRSA colonization surveillance program. It is not intended to diagnose MRSA infection nor to guide or monitor treatment for MRSA infections.      Labs: Basic Metabolic Panel:  Recent Labs Lab 08/01/15 1451 08/02/15 0540 08/04/15 0530  NA 140 140 140  K 3.6 4.3 4.8  CL 100* 100* 96*  CO2 33* 32 35*  GLUCOSE 102* 156* 114*  BUN 14 18 34*  CREATININE 1.04 1.15 0.98  CALCIUM 9.2 9.8 9.7   CBC:  Recent Labs Lab 08/01/15 1451 08/02/15 0540 08/03/15 0556 08/04/15 0530  WBC 6.2 5.6 11.2* 11.7*  NEUTROABS 3.9  --   --   --   HGB 17.7* 17.5* 18.5* 17.4*  HCT 54.7* 53.5* 56.9* 53.9*  MCV 96.8 94.9 96.4 95.6  PLT 193 196 192 217   Cardiac Enzymes:  Recent Labs Lab 08/01/15 1451  TROPONINI <0.03   BNP: BNP (last 3 results)  Recent Labs  08/01/15 1451  BNP 75.0      Signed:  Erick Blinks, MD  [facility] 08/04/2015, 9:09 AM    By signing my name  below, I, Zadie Cleverly, attest that this documentation has been prepared under the direction and in the presence of Erick Blinks, MD. Electronically signed: Zadie Cleverly, Scribe. 08/04/2015  I have reviewed the above documentation for accuracy and completeness, and I agree with the above.  Hillary Schwegler

## 2015-08-07 ENCOUNTER — Telehealth: Payer: Self-pay

## 2015-08-07 NOTE — Telephone Encounter (Signed)
Prior auth for Eliquis 5mg sent to [facility] via Cover My Meds. 

## 2015-08-07 NOTE — Telephone Encounter (Signed)
Eliquis  approved for 1 year. [id number] pharmacy notified.

## 2015-08-17 ENCOUNTER — Ambulatory Visit (INDEPENDENT_AMBULATORY_CARE_PROVIDER_SITE_OTHER): Payer: Medicare Other | Admitting: Family Medicine

## 2015-08-17 ENCOUNTER — Encounter: Payer: Self-pay | Admitting: Family Medicine

## 2015-08-17 ENCOUNTER — Other Ambulatory Visit: Payer: Self-pay | Admitting: *Deleted

## 2015-08-17 VITALS — BP 117/65 | HR 77 | Temp 98.6°F | Ht 68.0 in | Wt 245.2 lb

## 2015-08-17 DIAGNOSIS — J441 Chronic obstructive pulmonary disease with (acute) exacerbation: Secondary | ICD-10-CM | POA: Diagnosis not present

## 2015-08-17 DIAGNOSIS — E669 Obesity, unspecified: Secondary | ICD-10-CM | POA: Diagnosis not present

## 2015-08-17 DIAGNOSIS — R0600 Dyspnea, unspecified: Secondary | ICD-10-CM | POA: Diagnosis not present

## 2015-08-17 DIAGNOSIS — I5042 Chronic combined systolic (congestive) and diastolic (congestive) heart failure: Secondary | ICD-10-CM | POA: Diagnosis not present

## 2015-08-17 DIAGNOSIS — J9621 Acute and chronic respiratory failure with hypoxia: Secondary | ICD-10-CM | POA: Diagnosis not present

## 2015-08-17 DIAGNOSIS — I10 Essential (primary) hypertension: Secondary | ICD-10-CM

## 2015-08-17 DIAGNOSIS — F411 Generalized anxiety disorder: Secondary | ICD-10-CM

## 2015-08-17 DIAGNOSIS — G894 Chronic pain syndrome: Secondary | ICD-10-CM

## 2015-08-17 MED ORDER — LORAZEPAM 1 MG PO TABS
1.0000 mg | ORAL_TABLET | Freq: Three times a day (TID) | ORAL | Status: DC | PRN
Start: 2015-08-17 — End: 2016-02-20

## 2015-08-17 MED ORDER — TRAMADOL HCL 50 MG PO TABS: ORAL_TABLET | ORAL | Status: DC

## 2015-08-17 NOTE — Progress Notes (Signed)
Subjective:  Patient ID: Logan West, male    DOB: 09-07-1934  Age: 81 y.o. MRN: [id number]  CC: Hospitalization Follow-up   HPI Logan West presents for follow-up of his recent hospitalization for combination of COPD and congestive heart failure. Hospital record reviewed. He continues to have dyspnea. He is using inhalers as noted below along with nebulizer treatments. His dyspnea is improving with this treatment. He is currently not taking any steroids, just having finished a taper about 4 days ago. He is oxygen dependent and wearing his oxygen nasal cannula with him today. He did have to increase to 3 L earlier due to the exertion of walking into the building. He denies excessive edema although he has a small amount. He is concerned about taking more fluid medicine because he has to urinate so frequently now.  Patient in for follow-up of atrial fibrillation. Patient denies any recent bouts of chest pain or palpitations. Additionally, patient is taking anticoagulants. Patient denies any recent excessive bleeding episodes including epistaxis, bleeding from the gums, genitalia, rectal bleeding or hematuria. Additionally there has been no excessive bruising.  History Logan West has a past medical history of Hypertension; Hyperlipidemia; COPD (chronic obstructive pulmonary disease); Obesity; and Chronic pain.   He has past surgical history that includes Percutaneous pinning and Fracture surgery.   His family history includes Asthma in his father; Cancer in his mother; Coronary artery disease in his other; Heart attack in his brother.He reports that he quit smoking about 24 years ago. His smoking use included Cigarettes. He has a 62.5 pack-year smoking history. He does not have any smokeless tobacco history on file. His alcohol and drug histories are not on file.  Outpatient Prescriptions Prior to Visit  Medication Sig Dispense Refill  . albuterol (PROVENTIL HFA;VENTOLIN HFA) 108 (90 BASE) MCG/ACT  inhaler Inhale 2 puffs into the lungs every 6 (six) hours as needed for wheezing or shortness of breath.    Marland Kitchen albuterol (PROVENTIL) (2.5 MG/3ML) 0.083% nebulizer solution Take 3 mLs (2.5 mg total) by nebulization every 6 (six) hours as needed for wheezing or shortness of breath. 75 mL 12  . apixaban (ELIQUIS) 5 MG TABS tablet Take 1 tablet (5 mg total) by mouth 2 (two) times daily. 60 tablet 1  . budesonide-formoterol (SYMBICORT) 160-4.5 MCG/ACT inhaler Inhale 2 puffs into the lungs 2 (two) times daily. 1 Inhaler 5  . diltiazem (CARDIZEM SR) 120 MG 12 hr capsule Take 1 capsule (120 mg total) by mouth every 12 (twelve) hours. 60 capsule 1  . furosemide (LASIX) 20 MG tablet TAKE 1 TABLET (20 MG TOTAL) BY MOUTH DAILY. 30 tablet 5  . guaiFENesin (MUCINEX) 600 MG 12 hr tablet Take 2 tablets (1,200 mg total) by mouth 2 (two) times daily. 20 tablet 1  . LORazepam (ATIVAN) 1 MG tablet Take 1 tablet (1 mg total) by mouth every 8 (eight) hours as needed for anxiety. 60 tablet 4  . metoprolol (LOPRESSOR) 50 MG tablet TAKE 1 TABLET (50 MG TOTAL) BY MOUTH DAILY. 30 tablet 5  . pravastatin (PRAVACHOL) 40 MG tablet TAKE 1 TABLET (40 MG TOTAL) BY MOUTH DAILY. 30 tablet 5  . predniSONE (DELTASONE) 10 MG tablet take 82m po daily for 2 days then 361mdaily for 2 days then 2026maily for 2 days then 36m45mily for 2 days then stop 20 tablet 0  . traMADol (ULTRAM) 50 MG tablet TAKE 1 TABLET BY MOUTH EVERY TWELVE HOURS AS NEEDED FOR MODERATE PAIN 90 tablet 0  .  doxycycline (VIBRA-TABS) 100 MG tablet Take 1 tablet (100 mg total) by mouth every 12 (twelve) hours. (Patient not taking: Reported on 08/17/2015) 6 tablet 0   No facility-administered medications prior to visit.    ROS Review of Systems  Constitutional: Negative for fever, chills and diaphoresis.  HENT: Negative for congestion, rhinorrhea and sore throat.   Respiratory: Positive for cough and shortness of breath. Negative for wheezing.   Cardiovascular:  Negative for chest pain.  Gastrointestinal: Negative for nausea, vomiting, abdominal pain, diarrhea, constipation and abdominal distention.  Genitourinary: Negative for dysuria and frequency.  Musculoskeletal: Negative for joint swelling and arthralgias.  Skin: Negative for rash.  Neurological: Negative for headaches.    Objective:  BP 117/65 mmHg  Pulse 77  Temp(Src) 98.6 F (37 C) (Oral)  Ht 5' 8"  (1.727 m)  Wt 245 lb 3.2 oz (111.222 kg)  BMI 37.29 kg/m2  SpO2 91%  BP Readings from Last 3 Encounters:  08/17/15 117/65  08/04/15 117/65  03/20/15 98/55    Wt Readings from Last 3 Encounters:  08/17/15 245 lb 3.2 oz (111.222 kg)  08/03/15 240 lb 4.8 oz (109 kg)  03/20/15 254 lb (115.214 kg)     Physical Exam  Constitutional: He is oriented to person, place, and time. He appears well-developed and well-nourished. No distress.  HENT:  Head: Normocephalic and atraumatic.  Right Ear: External ear normal.  Left Ear: External ear normal.  Nose: Nose normal.  Mouth/Throat: Oropharynx is clear and moist.  Eyes: Conjunctivae and EOM are normal. Pupils are equal, round, and reactive to light.  Neck: Normal range of motion. Neck supple. No thyromegaly present.  Cardiovascular: Normal rate, regular rhythm and normal heart sounds.   No murmur heard. Pulmonary/Chest: Effort normal. No respiratory distress. He has no wheezes. He has no rales.  Breath sounds distant, but clear. Decreased exp. Phase without  Rhonchi or rales. Wearing nasal cannula set at 3 liters  Abdominal: Soft. Bowel sounds are normal. He exhibits no distension. There is no tenderness.  Musculoskeletal: He exhibits edema (2+ at ankles). He exhibits no tenderness.  Lymphadenopathy:    He has no cervical adenopathy.  Neurological: He is alert and oriented to person, place, and time. He has normal reflexes.  Skin: Skin is warm and dry. No rash noted.  Psychiatric: He has a normal mood and affect. His behavior is  normal.    No results found for: HGBA1C  Lab Results  Component Value Date   WBC 11.7* 08/04/2015   HGB 17.4* 08/04/2015   HCT 53.9* 08/04/2015   PLT 217 08/04/2015   GLUCOSE 114* 08/04/2015   CHOL 135 03/20/2015   TRIG 88 03/20/2015   HDL 47 03/20/2015   LDLCALC 70 03/20/2015   ALT 11 03/20/2015   AST 13 03/20/2015   NA 140 08/04/2015   K 4.8 08/04/2015   CL 96* 08/04/2015   CREATININE 0.98 08/04/2015   BUN 34* 08/04/2015   CO2 35* 08/04/2015   TSH 0.602 08/03/2015   PSA 1.9 03/31/2014   INR 1.01 08/01/2015    Dg Abd 1 View  08/02/2015   CLINICAL DATA:  80 year old male with a history of left abdominal pain.  EXAM: ABDOMEN - 1 VIEW  COMPARISON:  None.  FINDINGS: There has been exclusion of portions of the right abdomen.  Gas within transverse colon and descending colon.  Central small bowel gas present. Soft tissue density interposed between central small bowel loops and the left colon.  No radiopaque foreign  body.  Calcifications of the vasculature.  IMPRESSION: No evidence of bowel obstruction, however, there is abnormal soft tissue interposed between central small bowel loops and the left colon within the left abdomen of uncertain significance. Further evaluation with contrast-enhanced CT may be useful if there is concern for acute abdominal process.  Atherosclerosis.  Signed,  Dulcy Fanny. Earleen Newport, DO  [facility]  [facility]   Electronically Signed   By: Corrie Mckusick D.O.   On: 08/02/2015 13:00   Dg Chest Port 1 View  08/01/2015   CLINICAL DATA:  Shortness of breath.  EXAM: PORTABLE CHEST - 1 VIEW  COMPARISON:  Mar 31, 2014.  FINDINGS: The heart size and mediastinal contours are within normal limits. Both lungs are clear. No pneumothorax or pleural effusion is noted. The visualized skeletal structures are unremarkable.  IMPRESSION: No acute cardiopulmonary abnormality seen.   Electronically Signed   By: Marijo Conception, M.D.   On:  08/01/2015 15:14    Assessment & Plan:   Benyamin was seen today for hospitalization follow-up.  Diagnoses and all orders for this visit:  COPD exacerbation -     CBC with Differential/Platelet -     BMP8+EGFR  Chronic pain syndrome -     CBC with Differential/Platelet -     BMP8+EGFR  Acute on chronic respiratory failure with hypoxemia -     CBC with Differential/Platelet -     BMP8+EGFR  Chronic combined systolic and diastolic congestive heart failure -     CBC with Differential/Platelet -     BMP8+EGFR -     Brain natriuretic peptide  Essential hypertension -     CBC with Differential/Platelet -     BMP8+EGFR  Dyspnea -     CBC with Differential/Platelet -     BMP8+EGFR -     Brain natriuretic peptide  Obesity -     CBC with Differential/Platelet -     BMP8+EGFR  Other orders -     traMADol (ULTRAM) 50 MG tablet; TAKE 1 TABLET BY MOUTH EVERY eight HOURS AS NEEDED FOR MODERATE PAIN  I have discontinued Mr. Sarkis's doxycycline. I have also changed his traMADol. Additionally, I am having him maintain his budesonide-formoterol, furosemide, metoprolol, pravastatin, LORazepam, albuterol, albuterol, diltiazem, predniSONE, apixaban, guaiFENesin, and amLODipine.  Meds ordered this encounter  Medications  . amLODipine (NORVASC) 10 MG tablet    Sig: TAKE 1 TABLET (10 MG TOTAL) BY MOUTH DAILY.    Refill:  5  . traMADol (ULTRAM) 50 MG tablet    Sig: TAKE 1 TABLET BY MOUTH EVERY eight HOURS AS NEEDED FOR MODERATE PAIN    Dispense:  90 tablet    Refill:  5     Follow-up: Return in about 1 month (around 09/16/2015) for COPD.  Claretta Fraise, M.D.

## 2015-08-18 LAB — BMP8+EGFR
BUN / CREAT RATIO: 13 (ref 10–22)
BUN: 15 mg/dL (ref 8–27)
CHLORIDE: 95 mmol/L — AB (ref 97–108)
CO2: 29 mmol/L (ref 18–29)
Calcium: 9.4 mg/dL (ref 8.6–10.2)
Creatinine, Ser: 1.12 mg/dL (ref 0.76–1.27)
GFR calc non Af Amer: 62 mL/min/{1.73_m2} (ref >59)
GFR, EST AFRICAN AMERICAN: 72 mL/min/{1.73_m2} (ref >59)
Glucose: 89 mg/dL (ref 65–99)
Potassium: 4.6 mmol/L (ref 3.5–5.2)
Sodium: 140 mmol/L (ref 134–144)

## 2015-08-18 LAB — CBC WITH DIFFERENTIAL/PLATELET
BASOS: 0 %
Basophils Absolute: 0 10*3/uL (ref 0.0–0.2)
EOS (ABSOLUTE): 0.2 10*3/uL (ref 0.0–0.4)
Eos: 2 %
Hematocrit: 48.4 % (ref 37.5–51.0)
Hemoglobin: 15.9 g/dL (ref 12.6–17.7)
IMMATURE GRANS (ABS): 0 10*3/uL (ref 0.0–0.1)
Immature Granulocytes: 0 %
Lymphocytes Absolute: 0.8 10*3/uL (ref 0.7–3.1)
Lymphs: 9 %
MCH: 30 pg (ref 26.6–33.0)
MCHC: 32.9 g/dL (ref 31.5–35.7)
MCV: 91 fL (ref 79–97)
MONOS ABS: 0.7 10*3/uL (ref 0.1–0.9)
Monocytes: 8 %
NEUTROS ABS: 7.1 10*3/uL — AB (ref 1.4–7.0)
Neutrophils: 81 %
PLATELETS: 129 10*3/uL — AB (ref 150–379)
RBC: 5.3 x10E6/uL (ref 4.14–5.80)
RDW: 14.4 % (ref 12.3–15.4)
WBC: 8.7 10*3/uL (ref 3.4–10.8)

## 2015-08-18 LAB — BRAIN NATRIURETIC PEPTIDE: BNP: 103.3 pg/mL — ABNORMAL HIGH (ref 0.0–100.0)

## 2015-08-19 ENCOUNTER — Other Ambulatory Visit: Payer: Self-pay

## 2015-08-19 DIAGNOSIS — E785 Hyperlipidemia, unspecified: Secondary | ICD-10-CM

## 2015-08-19 DIAGNOSIS — J449 Chronic obstructive pulmonary disease, unspecified: Secondary | ICD-10-CM | POA: Diagnosis not present

## 2015-08-19 MED ORDER — PRAVASTATIN SODIUM 40 MG PO TABS: ORAL_TABLET | ORAL | Status: DC

## 2015-08-25 ENCOUNTER — Telehealth: Payer: Self-pay | Admitting: Family Medicine

## 2015-08-25 NOTE — Telephone Encounter (Signed)
Stacks pt 

## 2015-08-25 NOTE — Telephone Encounter (Signed)
I think he should be seen for acute illness.   Murtis Sink, MD [facility] 08/25/2015, 4:44 PM

## 2015-08-26 NOTE — Telephone Encounter (Signed)
Spoke with pts daughter, they may take him to the ER or come here, shes not sure.

## 2015-08-27 ENCOUNTER — Inpatient Hospital Stay (HOSPITAL_COMMUNITY)
Admission: EM | Admit: 2015-08-27 | Discharge: 2015-09-01 | DRG: 291 | Disposition: A | Discharge status: Home / Self Care | Payer: Medicare Other | Attending: Internal Medicine | Admitting: Internal Medicine

## 2015-08-27 ENCOUNTER — Encounter (HOSPITAL_COMMUNITY): Payer: Self-pay | Admitting: Emergency Medicine

## 2015-08-27 ENCOUNTER — Emergency Department (HOSPITAL_COMMUNITY): Payer: Medicare Other

## 2015-08-27 ENCOUNTER — Inpatient Hospital Stay (HOSPITAL_COMMUNITY): Payer: Medicare Other

## 2015-08-27 ENCOUNTER — Ambulatory Visit: Payer: Medicare Other | Admitting: Family Medicine

## 2015-08-27 DIAGNOSIS — A419 Sepsis, unspecified organism: Secondary | ICD-10-CM

## 2015-08-27 DIAGNOSIS — E669 Obesity, unspecified: Secondary | ICD-10-CM | POA: Diagnosis not present

## 2015-08-27 DIAGNOSIS — I4891 Unspecified atrial fibrillation: Secondary | ICD-10-CM | POA: Diagnosis not present

## 2015-08-27 DIAGNOSIS — R310 Gross hematuria: Secondary | ICD-10-CM | POA: Diagnosis not present

## 2015-08-27 DIAGNOSIS — G8929 Other chronic pain: Secondary | ICD-10-CM | POA: Diagnosis present

## 2015-08-27 DIAGNOSIS — Z6837 Body mass index (BMI) 37.0-37.9, adult: Secondary | ICD-10-CM

## 2015-08-27 DIAGNOSIS — N401 Enlarged prostate with lower urinary tract symptoms: Secondary | ICD-10-CM | POA: Diagnosis not present

## 2015-08-27 DIAGNOSIS — I482 Chronic atrial fibrillation, unspecified: Secondary | ICD-10-CM | POA: Diagnosis present

## 2015-08-27 DIAGNOSIS — Z9981 Dependence on supplemental oxygen: Secondary | ICD-10-CM

## 2015-08-27 DIAGNOSIS — Z7901 Long term (current) use of anticoagulants: Secondary | ICD-10-CM | POA: Diagnosis not present

## 2015-08-27 DIAGNOSIS — Y95 Nosocomial condition: Secondary | ICD-10-CM | POA: Diagnosis present

## 2015-08-27 DIAGNOSIS — N3941 Urge incontinence: Secondary | ICD-10-CM | POA: Diagnosis not present

## 2015-08-27 DIAGNOSIS — R319 Hematuria, unspecified: Secondary | ICD-10-CM | POA: Clinically undetermined

## 2015-08-27 DIAGNOSIS — R0602 Shortness of breath: Secondary | ICD-10-CM | POA: Diagnosis not present

## 2015-08-27 DIAGNOSIS — J9621 Acute and chronic respiratory failure with hypoxia: Secondary | ICD-10-CM | POA: Diagnosis not present

## 2015-08-27 DIAGNOSIS — R05 Cough: Secondary | ICD-10-CM | POA: Diagnosis not present

## 2015-08-27 DIAGNOSIS — J189 Pneumonia, unspecified organism: Secondary | ICD-10-CM | POA: Diagnosis not present

## 2015-08-27 DIAGNOSIS — J441 Chronic obstructive pulmonary disease with (acute) exacerbation: Secondary | ICD-10-CM | POA: Diagnosis not present

## 2015-08-27 DIAGNOSIS — J90 Pleural effusion, not elsewhere classified: Secondary | ICD-10-CM | POA: Diagnosis not present

## 2015-08-27 DIAGNOSIS — I10 Essential (primary) hypertension: Secondary | ICD-10-CM | POA: Diagnosis not present

## 2015-08-27 DIAGNOSIS — R109 Unspecified abdominal pain: Secondary | ICD-10-CM | POA: Diagnosis not present

## 2015-08-27 DIAGNOSIS — Z87891 Personal history of nicotine dependence: Secondary | ICD-10-CM

## 2015-08-27 DIAGNOSIS — I110 Hypertensive heart disease with heart failure: Secondary | ICD-10-CM | POA: Diagnosis not present

## 2015-08-27 DIAGNOSIS — R351 Nocturia: Secondary | ICD-10-CM | POA: Diagnosis present

## 2015-08-27 DIAGNOSIS — I5043 Acute on chronic combined systolic (congestive) and diastolic (congestive) heart failure: Secondary | ICD-10-CM | POA: Diagnosis not present

## 2015-08-27 DIAGNOSIS — E785 Hyperlipidemia, unspecified: Secondary | ICD-10-CM | POA: Diagnosis present

## 2015-08-27 LAB — CBC
HEMATOCRIT: 45.8 % (ref 39.0–52.0)
HEMATOCRIT: 43.3 % (ref 39.0–52.0)
HEMOGLOBIN: 14.3 g/dL (ref 13.0–17.0)
Hemoglobin: 13.8 g/dL (ref 13.0–17.0)
MCH: 29.7 pg (ref 26.0–34.0)
MCH: 30.1 pg (ref 26.0–34.0)
MCHC: 31.2 g/dL (ref 30.0–36.0)
MCHC: 31.9 g/dL (ref 30.0–36.0)
MCV: 95 fL (ref 78.0–100.0)
MCV: 94.3 fL (ref 78.0–100.0)
PLATELETS: 206 10*3/uL (ref 150–400)
Platelets: 206 10*3/uL (ref 150–400)
RBC: 4.82 MIL/uL (ref 4.22–5.81)
RBC: 4.59 MIL/uL (ref 4.22–5.81)
RDW: 13.4 % (ref 11.5–15.5)
RDW: 13.4 % (ref 11.5–15.5)
WBC: 4.8 10*3/uL (ref 4.0–10.5)
WBC: 4.5 10*3/uL (ref 4.0–10.5)

## 2015-08-27 LAB — I-STAT TROPONIN, ED: TROPONIN I, POC: 0 ng/mL (ref 0.00–0.08)

## 2015-08-27 LAB — BASIC METABOLIC PANEL
ANION GAP: 10 (ref 5–15)
BUN: 18 mg/dL (ref 6–20)
CHLORIDE: 101 mmol/L (ref 101–111)
CO2: 32 mmol/L (ref 22–32)
Calcium: 9.9 mg/dL (ref 8.9–10.3)
Creatinine, Ser: 1.13 mg/dL (ref 0.61–1.24)
GFR calc Af Amer: >60 mL/min (ref >60)
GFR, EST NON AFRICAN AMERICAN: 60 mL/min — AB (ref >60)
GLUCOSE: 125 mg/dL — AB (ref 65–99)
POTASSIUM: 3.9 mmol/L (ref 3.5–5.1)
Sodium: 143 mmol/L (ref 135–145)

## 2015-08-27 LAB — I-STAT ARTERIAL BLOOD GAS, ED
ACID-BASE EXCESS: 10 mmol/L — AB (ref 0.0–2.0)
BICARBONATE: 37.4 meq/L — AB (ref 20.0–24.0)
O2 Saturation: 91 %
PH ART: 7.393 (ref 7.350–7.450)
PO2 ART: 64 mmHg — AB (ref 80.0–100.0)
Patient temperature: 98.6
TCO2: 39 mmol/L (ref 0–100)
pCO2 arterial: 61.3 mmHg (ref 35.0–45.0)

## 2015-08-27 LAB — PROTIME-INR
INR: 1.38 (ref 0.00–1.49)
Prothrombin Time: 17.1 seconds — ABNORMAL HIGH (ref 11.6–15.2)

## 2015-08-27 MED ORDER — SODIUM CHLORIDE 0.9 % IJ SOLN
3.0000 mL | Freq: Two times a day (BID) | INTRAMUSCULAR | Status: DC
  Administered 2015-08-28 – 2015-08-31 (×3): 3 mL via INTRAVENOUS

## 2015-08-27 MED ORDER — CETYLPYRIDINIUM CHLORIDE 0.05 % MT LIQD
7.0000 mL | Freq: Two times a day (BID) | OROMUCOSAL | Status: DC
  Administered 2015-08-27 – 2015-09-01 (×10): 7 mL via OROMUCOSAL

## 2015-08-27 MED ORDER — ALBUTEROL SULFATE (2.5 MG/3ML) 0.083% IN NEBU
2.5000 mg | INHALATION_SOLUTION | Freq: Four times a day (QID) | RESPIRATORY_TRACT | Status: DC | PRN
  Administered 2015-08-29: 2.5 mg via RESPIRATORY_TRACT
  Filled 2015-08-27 (×2): qty 3

## 2015-08-27 MED ORDER — LORAZEPAM 1 MG PO TABS: 1.0000 mg | ORAL_TABLET | Freq: Three times a day (TID) | ORAL | Status: DC | PRN

## 2015-08-27 MED ORDER — ONDANSETRON HCL 4 MG/2ML IJ SOLN: 4.0000 mg | Freq: Four times a day (QID) | INTRAMUSCULAR | Status: DC | PRN

## 2015-08-27 MED ORDER — ADULT MULTIVITAMIN W/MINERALS CH
1.0000 | ORAL_TABLET | Freq: Every day | ORAL | Status: DC
  Administered 2015-08-27 – 2015-09-01 (×6): 1 via ORAL
  Filled 2015-08-27 (×6): qty 1

## 2015-08-27 MED ORDER — METHYLPREDNISOLONE SODIUM SUCC 125 MG IJ SOLR
60.0000 mg | Freq: Four times a day (QID) | INTRAMUSCULAR | Status: DC
  Administered 2015-08-28 (×2): 60 mg via INTRAVENOUS
  Filled 2015-08-27 (×2): qty 2

## 2015-08-27 MED ORDER — METOPROLOL TARTRATE 50 MG PO TABS
50.0000 mg | ORAL_TABLET | Freq: Every day | ORAL | Status: DC
  Administered 2015-08-28 – 2015-09-01 (×5): 50 mg via ORAL
  Filled 2015-08-27 (×5): qty 1

## 2015-08-27 MED ORDER — SODIUM CHLORIDE 0.9 % IJ SOLN: 3.0000 mL | INTRAMUSCULAR | Status: DC | PRN

## 2015-08-27 MED ORDER — PRAVASTATIN SODIUM 40 MG PO TABS
40.0000 mg | ORAL_TABLET | Freq: Every day | ORAL | Status: DC
  Administered 2015-08-28 – 2015-09-01 (×5): 40 mg via ORAL
  Filled 2015-08-27 (×5): qty 1

## 2015-08-27 MED ORDER — FUROSEMIDE 20 MG PO TABS
20.0000 mg | ORAL_TABLET | Freq: Every day | ORAL | Status: DC
  Administered 2015-08-28 – 2015-08-29 (×2): 20 mg via ORAL
  Filled 2015-08-27 (×2): qty 1

## 2015-08-27 MED ORDER — PIPERACILLIN-TAZOBACTAM 3.375 G IVPB
3.3750 g | Freq: Three times a day (TID) | INTRAVENOUS | Status: DC
  Administered 2015-08-28 – 2015-08-30 (×8): 3.375 g via INTRAVENOUS
  Filled 2015-08-27 (×9): qty 50

## 2015-08-27 MED ORDER — VANCOMYCIN HCL 10 G IV SOLR
2500.0000 mg | Freq: Once | INTRAVENOUS | Status: DC
  Administered 2015-08-27: 2500 mg via INTRAVENOUS
  Filled 2015-08-27: qty 2500

## 2015-08-27 MED ORDER — APIXABAN 5 MG PO TABS
5.0000 mg | ORAL_TABLET | Freq: Two times a day (BID) | ORAL | Status: DC
  Administered 2015-08-27 – 2015-08-30 (×6): 5 mg via ORAL
  Filled 2015-08-27 (×6): qty 1

## 2015-08-27 MED ORDER — SODIUM CHLORIDE 0.9 % IV BOLUS (SEPSIS)
1000.0000 mL | INTRAVENOUS | Status: DC
  Administered 2015-08-27 – 2015-08-28 (×2): 1000 mL via INTRAVENOUS

## 2015-08-27 MED ORDER — FOLIC ACID 1 MG PO TABS
1.0000 mg | ORAL_TABLET | Freq: Every day | ORAL | Status: DC
Start: 2015-08-27 — End: 2015-09-01
  Administered 2015-08-27 – 2015-09-01 (×6): 1 mg via ORAL
  Filled 2015-08-27 (×6): qty 1

## 2015-08-27 MED ORDER — VITAMIN B-1 100 MG PO TABS
100.0000 mg | ORAL_TABLET | Freq: Every day | ORAL | Status: DC
  Administered 2015-08-27 – 2015-09-01 (×6): 100 mg via ORAL
  Filled 2015-08-27 (×6): qty 1

## 2015-08-27 MED ORDER — HEPARIN SODIUM (PORCINE) 5000 UNIT/ML IJ SOLN: 5000.0000 [IU] | Freq: Three times a day (TID) | INTRAMUSCULAR | Status: DC

## 2015-08-27 MED ORDER — SODIUM CHLORIDE 0.9 % IJ SOLN
3.0000 mL | Freq: Two times a day (BID) | INTRAMUSCULAR | Status: DC
  Administered 2015-08-28 – 2015-09-01 (×4): 3 mL via INTRAVENOUS

## 2015-08-27 MED ORDER — VANCOMYCIN HCL IN DEXTROSE 750-5 MG/150ML-% IV SOLN
750.0000 mg | Freq: Two times a day (BID) | INTRAVENOUS | Status: DC
  Administered 2015-08-28 – 2015-08-30 (×5): 750 mg via INTRAVENOUS
  Filled 2015-08-27 (×7): qty 150

## 2015-08-27 MED ORDER — SODIUM CHLORIDE 0.9 % IV SOLN: 250.0000 mL | INTRAVENOUS | Status: DC | PRN

## 2015-08-27 MED ORDER — TRAMADOL HCL 50 MG PO TABS
50.0000 mg | ORAL_TABLET | Freq: Four times a day (QID) | ORAL | Status: DC | PRN
  Administered 2015-08-30 – 2015-08-31 (×2): 50 mg via ORAL
  Filled 2015-08-27 (×2): qty 1

## 2015-08-27 MED ORDER — ACETAMINOPHEN 325 MG PO TABS: 650.0000 mg | ORAL_TABLET | Freq: Four times a day (QID) | ORAL | Status: DC | PRN

## 2015-08-27 MED ORDER — PIPERACILLIN-TAZOBACTAM 3.375 G IVPB 30 MIN
3.3750 g | Freq: Once | INTRAVENOUS | Status: AC
  Administered 2015-08-27: 3.375 g via INTRAVENOUS
  Filled 2015-08-27: qty 50

## 2015-08-27 MED ORDER — ALBUTEROL SULFATE (2.5 MG/3ML) 0.083% IN NEBU
5.0000 mg | INHALATION_SOLUTION | Freq: Once | RESPIRATORY_TRACT | Status: AC
  Administered 2015-08-27: 5 mg via RESPIRATORY_TRACT
  Filled 2015-08-27: qty 6

## 2015-08-27 MED ORDER — BUDESONIDE-FORMOTEROL FUMARATE 160-4.5 MCG/ACT IN AERO
2.0000 | INHALATION_SPRAY | Freq: Two times a day (BID) | RESPIRATORY_TRACT | Status: DC
  Administered 2015-08-28: 2 via RESPIRATORY_TRACT
  Filled 2015-08-27: qty 6

## 2015-08-27 MED ORDER — ONDANSETRON HCL 4 MG PO TABS: 4.0000 mg | ORAL_TABLET | Freq: Four times a day (QID) | ORAL | Status: DC | PRN

## 2015-08-27 MED ORDER — IPRATROPIUM-ALBUTEROL 0.5-2.5 (3) MG/3ML IN SOLN
3.0000 mL | Freq: Once | RESPIRATORY_TRACT | Status: AC
  Administered 2015-08-27: 3 mL via RESPIRATORY_TRACT
  Filled 2015-08-27: qty 3

## 2015-08-27 MED ORDER — ACETAMINOPHEN 650 MG RE SUPP: 650.0000 mg | Freq: Four times a day (QID) | RECTAL | Status: DC | PRN

## 2015-08-27 MED ORDER — GUAIFENESIN ER 600 MG PO TB12
1200.0000 mg | ORAL_TABLET | Freq: Two times a day (BID) | ORAL | Status: DC
  Administered 2015-08-27 – 2015-09-01 (×10): 1200 mg via ORAL
  Filled 2015-08-27 (×9): qty 2

## 2015-08-27 MED ORDER — DILTIAZEM HCL ER 60 MG PO CP12
120.0000 mg | ORAL_CAPSULE | Freq: Two times a day (BID) | ORAL | Status: DC
  Administered 2015-08-27 – 2015-09-01 (×10): 120 mg via ORAL
  Filled 2015-08-27 (×12): qty 2

## 2015-08-27 NOTE — Progress Notes (Signed)
ANTIBIOTIC CONSULT NOTE - INITIAL  Pharmacy Consult for vancomycin and zosyn Indication: rule out pneumonia  No Known Allergies  Vital Signs: Temp: 97.5 F (36.4 C) (10/06 1336) Temp Source: Oral (10/06 1336) BP: 130/67 mmHg (10/06 1816) Pulse Rate: 83 (10/06 1817) Intake/Output from previous day:   Intake/Output from this shift:    Labs:  Recent Labs  08/27/15 1400  WBC 4.8  HGB 14.3  PLT 206  CREATININE 1.13   Estimated Creatinine Clearance: 64.1 mL/min (by C-G formula based on Cr of 1.13). No results for input(s): VANCOTROUGH, VANCOPEAK, VANCORANDOM, GENTTROUGH, GENTPEAK, GENTRANDOM, TOBRATROUGH, TOBRAPEAK, TOBRARND, AMIKACINPEAK, AMIKACINTROU, AMIKACIN in the last 72 hours.   Assessment: 81 yo male presenting with SOB  PMH: HTN, HLD, COPD,   ID: abx for PNA. WBC 4.8, AF  Vanc 10/6>> Zosyn 10/6>>  10/6 Blood x 2  Renal: SCr 1.13  Goal of Therapy:  Vancomycin trough level 15-20 mcg/ml  Plan:  Zosyn 3.375 gm IV q8h Vancomycin 2500 mg x 1 then 750 mg q12h Monitor clinical status, culture results, VT prn  Isaac Bliss, PharmD, [facility] Clinical Pharmacist Pager [phone] 08/27/2015 6:45 PM

## 2015-08-27 NOTE — ED Provider Notes (Signed)
CSN: [id number]     Arrival date & time 08/27/15  1326 History   First MD Initiated Contact with Patient 08/27/15 1346     Chief Complaint  Patient presents with  . Shortness of Breath     (Consider location/radiation/quality/duration/timing/severity/associated sxs/prior Treatment) Patient is a 81 y.o. male presenting with shortness of breath. The history is provided by the patient.  Shortness of Breath Associated symptoms: no abdominal pain, no chest pain, no fever, no headaches and no rash    patient brought in by daughter for worsening shortness of breath over the past few days. Particular since last night much worse when he exerts himself. Patient's post to be on 2-3 L of oxygen at home. Sats were 77% , increased him to 5 L nasal cannula. That brought his sats up to 92%. Upon presentation patient had labored breathing with retractions in the wheelchair lung sounds sounded diminished throughout. Patient denies any fever. Has had a cough that is occasionally productive and has been on Mucinex. Patient's on Lasix agent known to have CHF and COPD patient also on nebulizer treatments and prednisone.  Past Medical History  Diagnosis Date  . Hypertension     x30 years  . Hyperlipidemia     x 7-8  . COPD (chronic obstructive pulmonary disease) (HCC)   . Obesity   . Chronic pain    Past Surgical History  Procedure Laterality Date  . Percutaneous pinning      right forearm fracture  . Fracture surgery      right forearm   Family History  Problem Relation Age of Onset  . Cancer Mother   . Asthma Father   . Heart attack Brother     same mother.  MI in his 4s  . Coronary artery disease Other    Social History  Substance Use Topics  . Smoking status: Former Smoker -- 2.50 packs/day for 25 years    Types: Cigarettes    Quit date: 02/23/1991  . Smokeless tobacco: None  . Alcohol Use: None    Review of Systems  Constitutional: Negative for fever.  HENT: Positive for congestion.    Eyes: Negative for visual disturbance.  Respiratory: Positive for shortness of breath.   Cardiovascular: Positive for leg swelling. Negative for chest pain.  Gastrointestinal: Negative for abdominal pain.  Genitourinary: Negative for dysuria and decreased urine volume.  Musculoskeletal: Negative for back pain.  Skin: Negative for rash.  Neurological: Negative for syncope and headaches.  Hematological: Bruises/bleeds easily.  Psychiatric/Behavioral: Negative for confusion.      Allergies  Review of patient's allergies indicates no known allergies.  Home Medications   Prior to Admission medications   Medication Sig Start Date End Date Taking? Authorizing Provider  albuterol (PROVENTIL HFA;VENTOLIN HFA) 108 (90 BASE) MCG/ACT inhaler Inhale 2 puffs into the lungs every 6 (six) hours as needed for wheezing or shortness of breath.    Historical Provider, MD  albuterol (PROVENTIL) (2.5 MG/3ML) 0.083% nebulizer solution Take 3 mLs (2.5 mg total) by nebulization every 6 (six) hours as needed for wheezing or shortness of breath. 08/04/15   Erick Blinks, MD  amLODipine (NORVASC) 10 MG tablet TAKE 1 TABLET (10 MG TOTAL) BY MOUTH DAILY. 07/06/15   Historical Provider, MD  apixaban (ELIQUIS) 5 MG TABS tablet Take 1 tablet (5 mg total) by mouth 2 (two) times daily. 08/04/15   Erick Blinks, MD  budesonide-formoterol (SYMBICORT) 160-4.5 MCG/ACT inhaler Inhale 2 puffs into the lungs 2 (two) times daily. 05/28/13  Ileana Ladd, MD  diltiazem (CARDIZEM SR) 120 MG 12 hr capsule Take 1 capsule (120 mg total) by mouth every 12 (twelve) hours. 08/04/15   Erick Blinks, MD  furosemide (LASIX) 20 MG tablet TAKE 1 TABLET (20 MG TOTAL) BY MOUTH DAILY. 03/20/15   Tiffany A Gann, PA-C  guaiFENesin (MUCINEX) 600 MG 12 hr tablet Take 2 tablets (1,200 mg total) by mouth 2 (two) times daily. 08/04/15   Erick Blinks, MD  LORazepam (ATIVAN) 1 MG tablet Take 1 tablet (1 mg total) by mouth every 8 (eight) hours as  needed for anxiety. 08/17/15   Mechele Claude, MD  metoprolol (LOPRESSOR) 50 MG tablet TAKE 1 TABLET (50 MG TOTAL) BY MOUTH DAILY. 03/20/15   Tiffany A Gann, PA-C  pravastatin (PRAVACHOL) 40 MG tablet TAKE 1 TABLET (40 MG TOTAL) BY MOUTH DAILY. 08/19/15   Mechele Claude, MD  predniSONE (DELTASONE) 10 MG tablet take  po daily for 2 days then  daily for 2 days then  daily for 2 days then  daily for 2 days then stop 08/04/15   Erick Blinks, MD  traMADol (ULTRAM) 50 MG tablet TAKE 1 TABLET BY MOUTH EVERY eight HOURS AS NEEDED FOR MODERATE PAIN 08/17/15   Mechele Claude, MD   BP 140/57 mmHg  Pulse 73  Temp(Src) 97.5 F (36.4 C) (Oral)  Resp 22  SpO2 95% Physical Exam  Constitutional: He appears well-developed and well-nourished. No distress.  HENT:  Head: Normocephalic and atraumatic.  Mouth/Throat: Oropharynx is clear and moist.  Eyes: Conjunctivae and EOM are normal. Pupils are equal, round, and reactive to light.  Neck: Normal range of motion. Neck supple.  Cardiovascular: Normal rate, regular rhythm and normal heart sounds.   Pulmonary/Chest: Breath sounds normal. He is in respiratory distress.  Abdominal: Soft. Bowel sounds are normal. There is no tenderness.  Musculoskeletal: Normal range of motion. He exhibits edema.  Neurological: He is alert. No cranial nerve deficit. He exhibits normal muscle tone. Coordination normal.  Skin: Skin is warm. No rash noted.  Nursing note and vitals reviewed.   ED Course  Procedures (including critical care time) Labs Review Labs Reviewed  BASIC METABOLIC PANEL - Abnormal; Notable for the following:    Glucose, Bld 125 (*)    GFR calc non Af Amer 60 (*)    All other components within normal limits  PROTIME-INR - Abnormal; Notable for the following:    Prothrombin Time 17.1 (*)    All other components within normal limits  CBC  BRAIN NATRIURETIC PEPTIDE  I-STAT TROPOININ, ED   Results for orders placed or performed during the  hospital encounter of 08/27/15  Basic metabolic panel  Result Value Ref Range   Sodium 143 135 - 145 mmol/L   Potassium 3.9 3.5 - 5.1 mmol/L   Chloride 101 101 - 111 mmol/L   CO2 32 22 - 32 mmol/L   Glucose, Bld 125 (H) 65 - 99 mg/dL   BUN 18 6 - 20 mg/dL   Creatinine, Ser 1.61 0.61 - 1.24 mg/dL   Calcium 9.9 8.9 - 09.6 mg/dL   GFR calc non Af Amer 60 (L) >60 mL/min   GFR calc Af Amer >60 >60 mL/min   Anion gap 10 5 - 15  CBC  Result Value Ref Range   WBC 4.8 4.0 - 10.5 K/uL   RBC 4.82 4.22 - 5.81 MIL/uL   Hemoglobin 14.3 13.0 - 17.0 g/dL   HCT 04.5 40.9 - 81.1 %   MCV  95.0 78.0 - 100.0 fL   MCH 29.7 26.0 - 34.0 pg   MCHC 31.2 30.0 - 36.0 g/dL   RDW 40.9 81.1 - 91.4 %   Platelets 206 150 - 400 K/uL  Protime-INR  Result Value Ref Range   Prothrombin Time 17.1 (H) 11.6 - 15.2 seconds   INR 1.38 0.00 - 1.49  I-Stat Troponin, ED (not at [facility])  Result Value Ref Range   Troponin i, poc 0.00 0.00 - 0.08 ng/mL   Comment 3             Imaging Review Dg Chest Port 1 View  08/27/2015   CLINICAL DATA:  Increased shortness of Breath  EXAM: PORTABLE CHEST - 1 VIEW  COMPARISON:  08/01/2015  FINDINGS: Cardiac shadow is at the upper limits of normal in size but stable from the prior study. Increasing infiltrative changes are noted in the bases bilaterally particularly in the left lung base when compare with the prior study. No sizable effusion is seen. No bony abnormality is noted.  IMPRESSION: Increasing bibasilar infiltrates left greater than right. Followup PA and lateral chest X-ray is recommended in 3-4 weeks following trial of antibiotic therapy to ensure resolution and exclude underlying malignancy.   Electronically Signed   By: Alcide Clever M.D.   On: 08/27/2015 14:36   I have personally reviewed and evaluated these images and lab results as part of my medical decision-making.   EKG Interpretation   Date/Time:  Thursday August 27 2015 13:35:05 EDT Ventricular Rate:  81 PR  Interval:    QRS Duration: 68 QT Interval:  368 QTC Calculation: 427 R Axis:   74 Text Interpretation:  Atrial fibrillation Low voltage QRS Septal infarct ,  age undetermined Abnormal ECG Confirmed by Alesia Oshields  MD, Keyundra Fant [phone]) on  08/27/2015 2:03:00 PM      MDM   Final diagnoses:  SOB (shortness of breath)    Patient brought in by family member. For exertional shortness of breath getting worse the past few days. Patient normally uses 2 or 3 L of oxygen at home. Supposedly the sats were 77%. Now patient on 3 L of oxygen satting 94%. Chest x-ray raises some concern for mass or pneumonia. CT chest will be done to determine if pneumonia is present patient will require admission. If pneumonia is not present and patient will need to be M related if he desats he may very well require admission. Patient known to have a history of CHF and COPD. Patient given nebulizer treatments here patient's BNP is pending. There is no leukocytosis or fever so clinically not concerned about pneumonia despite plain chest x-ray results. Breathing did improve with nebulizer treatments.    Vanetta Mulders, MD 08/27/15 1651

## 2015-08-27 NOTE — ED Notes (Signed)
Pt arrives via POV from home with increased SOB since last night. Denies recent fever, some cough. Wears home O2 at 3L. Sats 77%. Increased to 5L [place] sats now 92%. Pt with labored breathing and retractions in wheelchair. Lung sounds diminished throughout.

## 2015-08-27 NOTE — H&P (Signed)
[facility] History and Physical  Trygg Mantz ZOX:[id number] DOB: 1934/05/30 DOA: 08/27/2015  Referring physician: Elwin Mocha, MD PCP: Mechele Claude, MD   Chief Complaint: Shortness of breath  HPI: Logan West is a 81 y.o. male with prior history of COPD HTN HLD presents with increased shortness of breath. Patient has noted increased shortness of breath for a couple weeks. He states that he was recently in the hospital for same complaints at [facility]. Patient states that he has not been able to walk even on level grounds. He states he also has a history of CHF and atrial fibrillation. Patient has had edema also. He states he has no cough noted aside from his baseline. He states has no chest pain noted.  Review of Systems:  Constitutional:  No weight loss, night sweats, Fevers, chills,+ fatigue.  HEENT:  No headaches, Difficulty swallowing,nasal congestion, post nasal drip,  Cardio-vascular:  No chest pain, Orthopnea, PND, +swelling in lower extremities,   GI:  No heartburn, indigestion, abdominal pain, nausea, vomiting, diarrhea  Resp:  +shortness of breath with exertion and at rest. +productive cough, No coughing up of blood  Skin:  no rash or lesions.  GU:  no dysuria, change in color of urine, no urgency or frequency Musculoskeletal:  No joint pain or swelling. No decreased range of motion Psych:  No change in mood or affect. No depression or anxiety  Past Medical History  Diagnosis Date  . Hypertension     x30 years  . Hyperlipidemia     x 7-8  . COPD (chronic obstructive pulmonary disease) (HCC)   . Obesity   . Chronic pain    Past Surgical History  Procedure Laterality Date  . Percutaneous pinning      right forearm fracture  . Fracture surgery      right forearm   Social History:  reports that he quit smoking about 24 years ago. His smoking use included Cigarettes. He has a 62.5 pack-year smoking history. He does not have any smokeless tobacco  history on file. His alcohol and drug histories are not on file.  No Known Allergies  Family History  Problem Relation Age of Onset  . Cancer Mother   . Asthma Father   . Heart attack Brother     same mother.  MI in his 81s  . Coronary artery disease Other      Prior to Admission medications   Medication Sig Start Date End Date Taking? Authorizing Provider  albuterol (PROVENTIL HFA;VENTOLIN HFA) 108 (90 BASE) MCG/ACT inhaler Inhale 2 puffs into the lungs every 6 (six) hours as needed for wheezing or shortness of breath.   Yes Historical Provider, MD  albuterol (PROVENTIL) (2.5 MG/3ML) 0.083% nebulizer solution Take 3 mLs (2.5 mg total) by nebulization every 6 (six) hours as needed for wheezing or shortness of breath. 08/04/15  Yes Erick Blinks, MD  amLODipine (NORVASC) 10 MG tablet TAKE 1 TABLET (10 MG TOTAL) BY MOUTH DAILY. 07/06/15  Yes Historical Provider, MD  apixaban (ELIQUIS) 5 MG TABS tablet Take 1 tablet (5 mg total) by mouth 2 (two) times daily. 08/04/15  Yes Erick Blinks, MD  budesonide-formoterol (SYMBICORT) 160-4.5 MCG/ACT inhaler Inhale 2 puffs into the lungs 2 (two) times daily. 05/28/13  Yes Ileana Ladd, MD  diltiazem (CARDIZEM SR) 120 MG 12 hr capsule Take 1 capsule (120 mg total) by mouth every 12 (twelve) hours. 08/04/15  Yes Erick Blinks, MD  furosemide (LASIX) 20 MG tablet TAKE 1 TABLET (20 MG  TOTAL) BY MOUTH DAILY. 03/20/15  Yes Tiffany A Gann, PA-C  guaiFENesin (MUCINEX) 600 MG 12 hr tablet Take 2 tablets (1,200 mg total) by mouth 2 (two) times daily. 08/04/15  Yes Erick Blinks, MD  LORazepam (ATIVAN) 1 MG tablet Take 1 tablet (1 mg total) by mouth every 8 (eight) hours as needed for anxiety. 08/17/15  Yes Mechele Claude, MD  metoprolol (LOPRESSOR) 50 MG tablet TAKE 1 TABLET (50 MG TOTAL) BY MOUTH DAILY. 03/20/15  Yes Tiffany A Gann, PA-C  pravastatin (PRAVACHOL) 40 MG tablet TAKE 1 TABLET (40 MG TOTAL) BY MOUTH DAILY. 08/19/15  Yes Mechele Claude, MD  traMADol (ULTRAM)  50 MG tablet TAKE 1 TABLET BY MOUTH EVERY eight HOURS AS NEEDED FOR MODERATE PAIN 08/17/15  Yes Mechele Claude, MD  predniSONE (DELTASONE) 10 MG tablet take  po daily for 2 days then  daily for 2 days then  daily for 2 days then  daily for 2 days then stop Patient not taking: Reported on 08/27/2015 08/04/15   Erick Blinks, MD   Physical Exam: Filed Vitals:   08/27/15 1530 08/27/15 1700 08/27/15 1816 08/27/15 1817  BP: 140/57 115/90 130/67   Pulse: 73 77 84 83  Temp:      TempSrc:      Resp: 22   17  SpO2: 95% 93% 94% 96%    Wt Readings from Last 3 Encounters:  08/17/15 111.222 kg (245 lb 3.2 oz)  08/03/15 109 kg (240 lb 4.8 oz)  03/20/15 115.214 kg (254 lb)    General:  Appears calm and comfortable Eyes: PERRL, normal lids, irises & conjunctiva ENT: grossly normal hearing, lips & tongue Neck: no LAD, masses or thyromegaly Cardiovascular: RRR, no m/r/g. + LE edema. Telemetry: atrial fib Respiratory: CTA bilaterally, no w/r/r. Normal respiratory effort. Abdomen: soft, ntnd obese Skin: no rash or induration seen on limited exam Musculoskeletal: grossly normal tone BUE/BLE Psychiatric: grossly normal mood and affect Neurologic: grossly non-focal.          Labs on Admission:  Basic Metabolic Panel:  Recent Labs Lab 08/27/15 1400  NA 143  K 3.9  CL 101  CO2 32  GLUCOSE 125*  BUN 18  CREATININE 1.13  CALCIUM 9.9   Liver Function Tests: No results for input(s): AST, ALT, ALKPHOS, BILITOT, PROT, ALBUMIN in the last 168 hours. No results for input(s): LIPASE, AMYLASE in the last 168 hours. No results for input(s): AMMONIA in the last 168 hours. CBC:  Recent Labs Lab 08/27/15 1400  WBC 4.8  HGB 14.3  HCT 45.8  MCV 95.0  PLT 206   Cardiac Enzymes: No results for input(s): CKTOTAL, CKMB, CKMBINDEX, TROPONINI in the last 168 hours.  BNP (last 3 results)  Recent Labs  08/01/15 1451 08/17/15 1210  BNP 75.0 103.3*    ProBNP (last 3  results) No results for input(s): PROBNP in the last 8760 hours.  CBG: No results for input(s): GLUCAP in the last 168 hours.  Radiological Exams on Admission: Ct Chest Wo Contrast  08/27/2015   CLINICAL DATA:  Shortness of breath 2 weeks.  Coughing.  Chest pain.  EXAM: CT CHEST WITHOUT CONTRAST  TECHNIQUE: Multidetector CT imaging of the chest was performed following the standard protocol without IV contrast.  COMPARISON:  None.  FINDINGS: The central airways are patent. There is a small left pleural effusion. There is left lower lobe airspace disease. There is lingular atelectasis and mild scarring. There a trace right pleural effusion. There are patchy areas  of ground-glass opacity in the right upper lobe. There is mild reticular nodular interstitial disease at the right lung base. There is no pneumothorax.  There are no pathologically enlarged axillary, hilar or mediastinal lymph nodes.  The heart size is normal. There is no pericardial effusion. The thoracic aorta is normal in caliber. There is thoracic aortic atherosclerosis.  Review of bone windows demonstrates no focal lytic or sclerotic lesions.  Limited non-contrast images of the upper abdomen were obtained. There is high attenuation material dependently within the gallbladder which may represent cholelithiasis versus gallbladder sludge. The remainder of the visualized abdominal organs are unremarkable.  IMPRESSION: 1. Left lower lobe pneumonia with a small parapneumonic effusion. 2. Mild reticular nodular interstitial disease at the right lung base likely secondary to an infectious etiology. Trace right pleural effusion.   Electronically Signed   By: Elige Ko   On: 08/27/2015 18:05   Dg Chest Port 1 View  08/27/2015   CLINICAL DATA:  Increased shortness of Breath  EXAM: PORTABLE CHEST - 1 VIEW  COMPARISON:  08/01/2015  FINDINGS: Cardiac shadow is at the upper limits of normal in size but stable from the prior study. Increasing infiltrative  changes are noted in the bases bilaterally particularly in the left lung base when compare with the prior study. No sizable effusion is seen. No bony abnormality is noted.  IMPRESSION: Increasing bibasilar infiltrates left greater than right. Followup PA and lateral chest X-ray is recommended in 3-4 weeks following trial of antibiotic therapy to ensure resolution and exclude underlying malignancy.   Electronically Signed   By: Alcide Clever M.D.   On: 08/27/2015 14:36      Assessment/Plan Active Problems:   Essential hypertension   HLD (hyperlipidemia)   Acute on chronic respiratory failure with hypoxemia (HCC)   COPD exacerbation (HCC)   HCAP (healthcare-associated pneumonia)   1. Acute on chronic Respiratory failure with hypoxia -will continue with oxygen and also I have asked for a ABG now -will admit to step down -lactate was elevated will start on sepsis protocol  2. HCAP -will start on zosyn and vancocin -obtain cultures sputum and blood -f/u CXR as needed -check urine ag for strep and legionella  3. COPD with exacerbation -will continue with inhalers proAIR symbicort -start on IV steroid with quick taper  4. HTN -will monitor pressures -will continue with   5. HLD -will continue with statins  6. Atrial Fibrillation -will continue with tiazac and lopressor -continue with apixaban     Code Status: full code (must indicate code status--if unknown or must be presumed, indicate so) DVT Prophylaxis:heparin Family Communication: none (indicate person spoken with, if applicable, with phone number if by telephone) Disposition Plan: home (indicate anticipated LOS)    [facility] Pager [phone]

## 2015-08-27 NOTE — ED Notes (Signed)
Heart Healthy Meal tray ordered

## 2015-08-27 NOTE — ED Notes (Signed)
Meal tray ordered 

## 2015-08-27 NOTE — ED Notes (Signed)
Upon entering pt room, pt sitting on end of stretcher and O2 sats at 84% on 3L O2. Pt pulled back up into bed and placed on 5L O2 nasal cannula, sats up to 90%. MD made aware.

## 2015-08-27 NOTE — ED Notes (Signed)
Attempted to call report

## 2015-08-28 DIAGNOSIS — E785 Hyperlipidemia, unspecified: Secondary | ICD-10-CM

## 2015-08-28 DIAGNOSIS — J441 Chronic obstructive pulmonary disease with (acute) exacerbation: Secondary | ICD-10-CM

## 2015-08-28 DIAGNOSIS — I10 Essential (primary) hypertension: Secondary | ICD-10-CM

## 2015-08-28 DIAGNOSIS — J9621 Acute and chronic respiratory failure with hypoxia: Secondary | ICD-10-CM

## 2015-08-28 DIAGNOSIS — J189 Pneumonia, unspecified organism: Secondary | ICD-10-CM

## 2015-08-28 LAB — COMPREHENSIVE METABOLIC PANEL
ALBUMIN: 2.9 g/dL — AB (ref 3.5–5.0)
ALT: 18 U/L (ref 17–63)
ANION GAP: 10 (ref 5–15)
AST: 18 U/L (ref 15–41)
Alkaline Phosphatase: 90 U/L (ref 38–126)
BILIRUBIN TOTAL: 1.2 mg/dL (ref 0.3–1.2)
BUN: 16 mg/dL (ref 6–20)
CALCIUM: 9.4 mg/dL (ref 8.9–10.3)
CO2: 33 mmol/L — ABNORMAL HIGH (ref 22–32)
Chloride: 99 mmol/L — ABNORMAL LOW (ref 101–111)
Creatinine, Ser: 1.15 mg/dL (ref 0.61–1.24)
GFR, EST NON AFRICAN AMERICAN: 59 mL/min — AB (ref >60)
GLUCOSE: 144 mg/dL — AB (ref 65–99)
POTASSIUM: 4.1 mmol/L (ref 3.5–5.1)
Sodium: 142 mmol/L (ref 135–145)
TOTAL PROTEIN: 6.3 g/dL — AB (ref 6.5–8.1)

## 2015-08-28 LAB — URINALYSIS, ROUTINE W REFLEX MICROSCOPIC
BILIRUBIN URINE: NEGATIVE
GLUCOSE, UA: NEGATIVE mg/dL
Ketones, ur: 15 mg/dL — AB
Leukocytes, UA: NEGATIVE
NITRITE: NEGATIVE
PH: 5.5 (ref 5.0–8.0)
Protein, ur: 30 mg/dL — AB
SPECIFIC GRAVITY, URINE: 1.027 (ref 1.005–1.030)
Urobilinogen, UA: 0.2 mg/dL (ref 0.0–1.0)

## 2015-08-28 LAB — LACTIC ACID, PLASMA
LACTIC ACID, VENOUS: 1 mmol/L (ref 0.5–2.0)
LACTIC ACID, VENOUS: 0.9 mmol/L (ref 0.5–2.0)

## 2015-08-28 LAB — BRAIN NATRIURETIC PEPTIDE: B NATRIURETIC PEPTIDE 5: 160.6 pg/mL — AB (ref 0.0–100.0)

## 2015-08-28 LAB — PROCALCITONIN

## 2015-08-28 LAB — URINE MICROSCOPIC-ADD ON

## 2015-08-28 LAB — CBC
HEMATOCRIT: 43 % (ref 39.0–52.0)
Hemoglobin: 13.7 g/dL (ref 13.0–17.0)
MCH: 30.2 pg (ref 26.0–34.0)
MCHC: 31.9 g/dL (ref 30.0–36.0)
MCV: 94.7 fL (ref 78.0–100.0)
Platelets: 200 10*3/uL (ref 150–400)
RBC: 4.54 MIL/uL (ref 4.22–5.81)
RDW: 13.4 % (ref 11.5–15.5)
WBC: 4.6 10*3/uL (ref 4.0–10.5)

## 2015-08-28 LAB — EXPECTORATED SPUTUM ASSESSMENT W GRAM STAIN, RFLX TO RESP C

## 2015-08-28 LAB — TROPONIN I
Troponin I: <0.03 ng/mL (ref <0.031)
Troponin I: <0.03 ng/mL (ref <0.031)

## 2015-08-28 LAB — EXPECTORATED SPUTUM ASSESSMENT W REFEX TO RESP CULTURE

## 2015-08-28 LAB — STREP PNEUMONIAE URINARY ANTIGEN: Strep Pneumo Urinary Antigen: NEGATIVE

## 2015-08-28 LAB — CREATININE, SERUM
Creatinine, Ser: 1.25 mg/dL — ABNORMAL HIGH (ref 0.61–1.24)
GFR calc Af Amer: >60 mL/min (ref >60)
GFR, EST NON AFRICAN AMERICAN: 53 mL/min — AB (ref >60)

## 2015-08-28 LAB — PROTIME-INR
INR: 1.38 (ref 0.00–1.49)
PROTHROMBIN TIME: 17.1 s — AB (ref 11.6–15.2)

## 2015-08-28 LAB — MRSA PCR SCREENING: MRSA BY PCR: NEGATIVE

## 2015-08-28 LAB — TSH: TSH: 1.027 u[IU]/mL (ref 0.350–4.500)

## 2015-08-28 LAB — GLUCOSE, CAPILLARY: GLUCOSE-CAPILLARY: 162 mg/dL — AB (ref 65–99)

## 2015-08-28 MED ORDER — SODIUM CHLORIDE 0.9 % IV SOLN
INTRAVENOUS | Status: DC
  Administered 2015-08-28: 75 mL/h via INTRAVENOUS

## 2015-08-28 MED ORDER — PREDNISONE 20 MG PO TABS
50.0000 mg | ORAL_TABLET | Freq: Every day | ORAL | Status: DC
  Administered 2015-08-28 – 2015-09-01 (×5): 50 mg via ORAL
  Filled 2015-08-28 (×2): qty 2
  Filled 2015-08-28 (×2): qty 1
  Filled 2015-08-28 (×2): qty 2
  Filled 2015-08-28 (×2): qty 1
  Filled 2015-08-28: qty 2
  Filled 2015-08-28: qty 1

## 2015-08-28 MED ORDER — BUDESONIDE 0.25 MG/2ML IN SUSP
0.2500 mg | Freq: Two times a day (BID) | RESPIRATORY_TRACT | Status: DC
  Administered 2015-08-28 – 2015-09-01 (×8): 0.25 mg via RESPIRATORY_TRACT
  Filled 2015-08-28 (×8): qty 2

## 2015-08-28 NOTE — Progress Notes (Signed)
Utilization review completed. Jovan Colligan, RN, BSN. 

## 2015-08-28 NOTE — Progress Notes (Signed)
PROGRESS NOTE  Logan West ZOX:[id number] DOB: July 31, 1934 DOA: 08/27/2015 PCP: Mechele Claude, MD  HPI: Logan West is a 81 y.o. male with prior history of COPD HTN HLD presents with increased shortness of breath. Patient has noted increased shortness of breath for a couple weeks. He states that he was recently in the hospital for same complaints at [facility]. Patient states that he has not been able to walk even on level grounds. He states he also has a history of CHF and atrial fibrillation. Patient has had edema also. He states he has no cough noted aside from his baseline. He states has no chest pain noted.  Subjective / 24 H Interval events - he feels better, breathing better this morning - denies chest pain, palpitations - has a cough non productive  Assessment/Plan: Active Problems:   Essential hypertension   HLD (hyperlipidemia)   Acute on chronic respiratory failure with hypoxemia (HCC)   COPD exacerbation (HCC)   HCAP (healthcare-associated pneumonia)   Acute on chronic Respiratory failure with hypoxia - multifactorial due to COPD exacerbation / HCAP, oxygen as needed - continue antibiotics, supportive treatment  HCAP - continue zosyn and vancomycin - obtain cultures sputum and blood - strep pneumo negative, legionella pending  COPD with exacerbation - change to Pulmicort, continue Duonebs - change IV steroids to prednisone  HTN - blood pressure controlled - will continue with Diltiazem, Lopressor  HLD - will continue with statins  Atrial Fibrillation - will continue with cardizem and lopressor - continue with apixaban   Diet: Diet heart healthy/carb modified Room service appropriate?: Yes; Fluid consistency:: Thin Fluids: none  DVT Prophylaxis: Eliquis  Code Status: Full Code Family Communication: no family bedside  Disposition Plan: home when ready   Consultants:  None   Procedures:  None    Antibiotics Vancomycin 10/6  >> Zosyn 10/6 >>   Studies  Ct Chest Wo Contrast  08/27/2015   CLINICAL DATA:  Shortness of breath 2 weeks.  Coughing.  Chest pain.  EXAM: CT CHEST WITHOUT CONTRAST  TECHNIQUE: Multidetector CT imaging of the chest was performed following the standard protocol without IV contrast.  COMPARISON:  None.  FINDINGS: The central airways are patent. There is a small left pleural effusion. There is left lower lobe airspace disease. There is lingular atelectasis and mild scarring. There a trace right pleural effusion. There are patchy areas of ground-glass opacity in the right upper lobe. There is mild reticular nodular interstitial disease at the right lung base. There is no pneumothorax.  There are no pathologically enlarged axillary, hilar or mediastinal lymph nodes.  The heart size is normal. There is no pericardial effusion. The thoracic aorta is normal in caliber. There is thoracic aortic atherosclerosis.  Review of bone windows demonstrates no focal lytic or sclerotic lesions.  Limited non-contrast images of the upper abdomen were obtained. There is high attenuation material dependently within the gallbladder which may represent cholelithiasis versus gallbladder sludge. The remainder of the visualized abdominal organs are unremarkable.  IMPRESSION: 1. Left lower lobe pneumonia with a small parapneumonic effusion. 2. Mild reticular nodular interstitial disease at the right lung base likely secondary to an infectious etiology. Trace right pleural effusion.   Electronically Signed   By: Elige Ko   On: 08/27/2015 18:05   Dg Chest Port 1 View  08/27/2015   CLINICAL DATA:  Increased shortness of Breath  EXAM: PORTABLE CHEST - 1 VIEW  COMPARISON:  08/01/2015  FINDINGS: Cardiac shadow is at the upper  limits of normal in size but stable from the prior study. Increasing infiltrative changes are noted in the bases bilaterally particularly in the left lung base when compare with the prior study. No sizable effusion  is seen. No bony abnormality is noted.  IMPRESSION: Increasing bibasilar infiltrates left greater than right. Followup PA and lateral chest X-ray is recommended in 3-4 weeks following trial of antibiotic therapy to ensure resolution and exclude underlying malignancy.   Electronically Signed   By: Alcide Clever M.D.   On: 08/27/2015 14:36    Objective  Filed Vitals:   08/28/15 0802 08/28/15 0843 08/28/15 0844 08/28/15 1043  BP:  152/66  129/78  Pulse:  52 92 78  Temp:      TempSrc:      Resp:  Height:      Weight:      SpO2: 94% 93% 95% 93%    Intake/Output Summary (Last 24 hours) at 08/28/15 1156 Last data filed at 08/28/15 1100  Gross per 24 hour  Intake 1132.5 ml  Output    600 ml  Net  532.5 ml   Filed Weights   08/27/15 2147  Weight: 110.542 kg (243 lb 11.2 oz)   Exam:  GENERAL: NAD  HEENT: head NCAT, no scleral icterus. Pupils round and reactive.   NECK: Supple. No LAD  LUNGS: distant breath sounds, scattered wheezing, no crackles  HEART: Regular rate and rhythm without murmur. 2+ pulses, no JVD, no peripheral edema  ABDOMEN: Soft, non-distended, non-tender. Positive bowel sounds.  EXTREMITIES: Without any cyanosis or clubbing. Good muscle tone  NEUROLOGIC: Alert and oriented x3. Cranial nerves II through XII are grossly intact. Strength 5/5 in all 4.   Data Reviewed: Basic Metabolic Panel:  Recent Labs Lab 08/27/15 1400 08/27/15 2301 08/28/15 0350  NA 143  --  142  K 3.9  --  4.1  CL 101  --  99*  CO2 32  --  33*  GLUCOSE 125*  --  144*  BUN 18  --  16  CREATININE 1.13 1.25* 1.15  CALCIUM 9.9  --  9.4   Liver Function Tests:  Recent Labs Lab 08/28/15 0350  AST 18  ALT 18  ALKPHOS 90  BILITOT 1.2  PROT 6.3*  ALBUMIN 2.9*   CBC:  Recent Labs Lab 08/27/15 1400 08/27/15 2301 08/28/15 0350  WBC 4.8 4.5 4.6  HGB 14.3 13.8 13.7  HCT 45.8 43.3 43.0  MCV 95.0 94.3 94.7  PLT 206 206 200   Cardiac Enzymes:  Recent  Labs Lab 08/27/15 2301 08/28/15 0350 08/28/15 1000  TROPONINI <0.03 <0.03 <0.03   BNP (last 3 results)  Recent Labs  08/01/15 1451 08/17/15 1210 08/27/15 2301  BNP 75.0 103.3* 160.6*   CBG:  Recent Labs Lab 08/28/15 0730  GLUCAP 162*    Recent Results (from the past 240 hour(s))  Blood culture (routine x 2)     Status: None (Preliminary result)   Collection Time: 08/27/15  7:10 PM  Result Value Ref Range Status   Specimen Description BLOOD LEFT HAND  Final   Special Requests BOTTLES DRAWN AEROBIC AND ANAEROBIC  Final   Culture NO GROWTH < 24 HOURS  Final   Report Status PENDING  Incomplete  Blood culture (routine x 2)     Status: None (Preliminary result)   Collection Time: 08/27/15  7:31 PM  Result Value Ref Range Status   Specimen Description BLOOD RIGHT ARM  Final  Special Requests BOTTLES DRAWN AEROBIC AND ANAEROBIC 5CC  Final   Culture NO GROWTH < 24 HOURS  Final   Report Status PENDING  Incomplete  MRSA PCR Screening     Status: None   Collection Time: 08/27/15 10:03 PM  Result Value Ref Range Status   MRSA by PCR NEGATIVE NEGATIVE Final    Comment:        The GeneXpert MRSA Assay (FDA approved for NASAL specimens only), is one component of a comprehensive MRSA colonization surveillance program. It is not intended to diagnose MRSA infection nor to guide or monitor treatment for MRSA infections.      Scheduled Meds: . antiseptic oral rinse  7 mL Mouth Rinse BID  . apixaban  5 mg Oral BID  . budesonide (PULMICORT) nebulizer solution  0.25 mg Nebulization BID  . diltiazem  120 mg Oral Q12H  . folic acid  1 mg Oral Daily  . furosemide  20 mg Oral Daily  . guaiFENesin  1,200 mg Oral BID  . metoprolol  50 mg Oral Daily  . multivitamin with minerals  1 tablet Oral Daily  . piperacillin-tazobactam (ZOSYN)  IV  3.375 g Intravenous Q8H  . pravastatin  40 mg Oral Daily  . predniSONE  50 mg Oral Q breakfast  . sodium chloride  3 mL Intravenous Q12H   . sodium chloride  3 mL Intravenous Q12H  . thiamine  100 mg Oral Daily  . vancomycin  750 mg Intravenous Q12H   Continuous Infusions:    Pamella Pert, MD [facility] Pager [phone]. If 7 PM - 7 AM, please contact night-coverage at [email or web address], password [facility] 08/28/2015, 11:56 AM  LOS: 1 day

## 2015-08-28 NOTE — Evaluation (Signed)
Physical Therapy Evaluation Patient Details Name: Logan West MRN: [id number] DOB: 08/31/1934 Today's Date: 08/28/2015   History of Present Illness  Paulo Keimig is a 81 y.o. male with prior history of COPD HTN HLD CHF aFib presents with increased shortness of breath.  Admitted for HCAP, aflutter and CHF.  Clinical Impression  Patient presents with decreased mobility due to deficits listed in PT problem list.  Mainly due to SOB/hypoxia with mobility.  Feel nursing can safely mobilize him with O2, but skilled PT needed to address issues below and progress to independent for return home.    Follow Up Recommendations No PT follow up    Equipment Recommendations  None recommended by PT    Recommendations for Other Services       Precautions / Restrictions Precautions Precaution Comments: watch O2 sats      Mobility  Bed Mobility Overal bed mobility: Modified Independent             General bed mobility comments: sit to supine  Transfers Overall transfer level: Modified independent               General transfer comment: up from recliner  Ambulation/Gait Ambulation/Gait assistance: Supervision;Min guard Ambulation Distance (Feet): 40 Feet (x 2) Assistive device:  (pushing IVpole) Gait Pattern/deviations: Step-through pattern;Decreased stride length;Trunk flexed     General Gait Details: flexed due to SOB; SpO2 down to 85% ambulating on 6LO2, seated rest x 4 minutes for recovery up to 94%  Stairs            Wheelchair Mobility    Modified Rankin (Stroke Patients Only)       Balance Overall balance assessment: Needs assistance           Standing balance-Leahy Scale: Good Standing balance comment: static balance good, but with ambulation feels better pushing IV pole                             Pertinent Vitals/Pain Pain Assessment: No/denies pain    Home Living Family/patient expects to be discharged to:: Private  residence Living Arrangements: Spouse/significant other;Children Available Help at Discharge: Family Type of Home: House Home Access: Stairs to enter Entrance Stairs-Rails: Right Entrance Stairs-Number of Steps: 7 Home Layout: One level Home Equipment: Other (comment);Shower seat (oxygen)      Prior Function Level of Independence: Independent               Hand Dominance        Extremity/Trunk Assessment               Lower Extremity Assessment: Overall WFL for tasks assessed         Communication   Communication: No difficulties  Cognition Arousal/Alertness: Awake/alert Behavior During Therapy: WFL for tasks assessed/performed Overall Cognitive Status: Within Functional Limits for tasks assessed                      General Comments      Exercises        Assessment/Plan    PT Assessment Patient needs continued PT services  PT Diagnosis Generalized weakness   PT Problem List Decreased activity tolerance;Decreased mobility;Decreased balance;Decreased knowledge of use of DME;Cardiopulmonary status limiting activity  PT Treatment Interventions DME instruction;Balance training;Gait training;Functional mobility training;Therapeutic activities;Stair training;Therapeutic exercise   PT Goals (Current goals can be found in the Care Plan section) Acute Rehab PT Goals Patient Stated Goal: To breathe better  PT Goal Formulation: With patient Time For Goal Achievement: 09/04/15 Potential to Achieve Goals: Good    Frequency Min 3X/week   Barriers to discharge        Co-evaluation               End of Session Equipment Utilized During Treatment: Oxygen Activity Tolerance: Patient limited by fatigue;Treatment limited secondary to medical complications (Comment) (decreased SpO2) Patient left: in bed;with call bell/phone within reach           Time: 1520-1600 PT Time Calculation (min) (ACUTE ONLY): 40 min   Charges:   PT  Evaluation $Initial PT Evaluation Tier I: 1 Procedure PT Treatments $Gait Training: 8-22 mins   PT G Codes:        Kariel Skillman,CYNDI 06-Sep-2015, 4:52 PM  Sheran Lawless, PT [phone] 09/06/2015

## 2015-08-28 NOTE — Progress Notes (Signed)
Patient came from ER with a fluid bolus infusing via IV. Order was written for 2nd IV fluid bolus due to lactic acid. Lactic acid had not been drawn yet at that time. Patient was on 5l/[place] o2 and sats were 96% and resp were 21. Patient was using accessory muscles which each breath, but did not appear otherwise in resp distress. Lungs sounds were diminished and bilat lower extremties had 1+pitting edema noted. Abdomen large and round with no s/s acute distress. Patient denied SHOB at that time. Rapid response was called to help clarify order and need for IV NS 1000cc bolus(2nd one). NS was started and upon request of Rapid response when lactic acid  Resulted NP K. Schorr was notified of 1.0 level for lactic acid. Order was given to stop bolus and run NS at 75cc/hr till reassessed by day shift team. VSS throughout with b/p 130's and 140's systolic. Patient resting comfortable and will continue to monitor. [facility] [employer]

## 2015-08-29 DIAGNOSIS — I5043 Acute on chronic combined systolic (congestive) and diastolic (congestive) heart failure: Secondary | ICD-10-CM

## 2015-08-29 DIAGNOSIS — I482 Chronic atrial fibrillation, unspecified: Secondary | ICD-10-CM | POA: Diagnosis present

## 2015-08-29 LAB — CBC
HCT: 40.6 % (ref 39.0–52.0)
Hemoglobin: 12.9 g/dL — ABNORMAL LOW (ref 13.0–17.0)
MCH: 29.8 pg (ref 26.0–34.0)
MCHC: 31.8 g/dL (ref 30.0–36.0)
MCV: 93.8 fL (ref 78.0–100.0)
PLATELETS: 208 10*3/uL (ref 150–400)
RBC: 4.33 MIL/uL (ref 4.22–5.81)
RDW: 13.5 % (ref 11.5–15.5)
WBC: 5.4 10*3/uL (ref 4.0–10.5)

## 2015-08-29 LAB — BASIC METABOLIC PANEL
Anion gap: 10 (ref 5–15)
BUN: 23 mg/dL — AB (ref 6–20)
CALCIUM: 9.7 mg/dL (ref 8.9–10.3)
CO2: 34 mmol/L — ABNORMAL HIGH (ref 22–32)
CREATININE: 1.07 mg/dL (ref 0.61–1.24)
Chloride: 99 mmol/L — ABNORMAL LOW (ref 101–111)
Glucose, Bld: 143 mg/dL — ABNORMAL HIGH (ref 65–99)
Potassium: 4.2 mmol/L (ref 3.5–5.1)
SODIUM: 143 mmol/L (ref 135–145)

## 2015-08-29 LAB — LEGIONELLA PNEUMOPHILA SEROGP 1 UR AG: L. PNEUMOPHILA SEROGP 1 UR AG: NEGATIVE

## 2015-08-29 LAB — URINALYSIS, ROUTINE W REFLEX MICROSCOPIC
GLUCOSE, UA: NEGATIVE mg/dL
KETONES UR: 15 mg/dL — AB
NITRITE: POSITIVE — AB
PH: 5 (ref 5.0–8.0)
Protein, ur: 100 mg/dL — AB
SPECIFIC GRAVITY, URINE: 1.021 (ref 1.005–1.030)
Urobilinogen, UA: 1 mg/dL (ref 0.0–1.0)

## 2015-08-29 LAB — URINE MICROSCOPIC-ADD ON

## 2015-08-29 LAB — GLUCOSE, CAPILLARY: GLUCOSE-CAPILLARY: 139 mg/dL — AB (ref 65–99)

## 2015-08-29 LAB — HEMOGLOBIN A1C
Hgb A1c MFr Bld: 6 % — ABNORMAL HIGH (ref 4.8–5.6)
Mean Plasma Glucose: 126 mg/dL

## 2015-08-29 LAB — VANCOMYCIN, TROUGH
VANCOMYCIN TR: 25 ug/mL — AB (ref 10.0–20.0)
VANCOMYCIN TR: 19 ug/mL (ref 10.0–20.0)

## 2015-08-29 MED ORDER — FUROSEMIDE 10 MG/ML IJ SOLN
20.0000 mg | Freq: Every day | INTRAMUSCULAR | Status: DC
  Administered 2015-08-30 – 2015-08-31 (×2): 20 mg via INTRAVENOUS
  Filled 2015-08-29 (×2): qty 2

## 2015-08-29 MED ORDER — FUROSEMIDE 10 MG/ML IJ SOLN: 20.0000 mg | Freq: Every day | INTRAMUSCULAR | Status: DC

## 2015-08-29 NOTE — Progress Notes (Addendum)
ANTIBIOTIC CONSULT NOTE - Follow-up  Pharmacy Consult for vancomycin  Indication: rule out pneumonia  No Known Allergies  Vital Signs: Temp: 98.4 F (36.9 C) (10/08 0500) Temp Source: Oral (10/08 0500) BP: 119/78 mmHg (10/08 0500) Pulse Rate: 94 (10/08 0500) Intake/Output from previous day: 10/07 0701 - 10/08 0700 In: 1616.3 [P.O.:1200; I.V.:166.3; IV Piggyback:250] Out: 650 [Urine:650] Intake/Output from this shift: Total I/O In: 290 [P.O.:240; IV Piggyback:50] Out: 350 [Urine:350]  Labs:  Recent Labs  08/27/15 2301 08/28/15 0350 08/29/15 0530  WBC 4.5 4.6 5.4  HGB 13.8 13.7 12.9*  PLT 206 200 208  CREATININE 1.25* 1.15 1.07   Estimated Creatinine Clearance: 67.4 mL/min (by C-G formula based on Cr of 1.07).  Recent Labs  08/29/15 0530  VANCOTROUGH 25*     Assessment: 81 yom continues on vancomycin for pneumonia. Pt is afebrile and WBC is WNL. A vancomycin trough was checked today and is slightly above goal at 25. However, lab was drawn just after the vancomycin infusion was started so level may have been impacted.   Goal of Therapy:  Vancomycin trough level 15-20 mcg/ml  Plan:  - Continue vanc  IV Q12H for now - Recheck a trough tonight prior to the next dose  Lysle Pearl, PharmD, BCPS Pager # [phone] 08/29/2015 6:50 AM   Addendum:  Vanc trough came back tonight at 19 so it's now in range. Scr still stable. Cont vanc  IV q12  Ulyses Southward, PharmD Pager: [phone] 08/29/2015 6:27 PM

## 2015-08-29 NOTE — Progress Notes (Addendum)
PROGRESS NOTE  Logan West ZOX:[id number] DOB: May 05, 1934 DOA: 08/27/2015 PCP: Mechele Claude, MD  HPI: Garen Woolbright is a 81 y.o. male with prior history of COPD HTN HLD presents with increased shortness of breath. Patient has noted increased shortness of breath for a couple weeks. He states that he was recently in the hospital for same complaints at [facility]. Patient states that he has not been able to walk even on level grounds. He states he also has a history of CHF and atrial fibrillation. Patient has had edema also. He states he has no cough noted aside from his baseline. He states has no chest pain noted.  Subjective / 24 H Interval events - feels more short of breath today - denies chest pain / palpitations  Assessment/Plan: Active Problems:   Essential hypertension   HLD (hyperlipidemia)   Acute on chronic respiratory failure with hypoxemia (HCC)   COPD exacerbation (HCC)   HCAP (healthcare-associated pneumonia)   Acute on chronic combined systolic and diastolic heart failure (HCC)   Atrial fibrillation (HCC)   Acute on chronic Respiratory failure with hypoxia - subjectively worse today - multifactorial due to COPD exacerbation / HCAP, oxygen as needed - continue antibiotics, supportive treatment  - IV Lasix today given heart failure  Acute on chronic combined systolic and diastolic CHF - received fluids on admission, has LE edema and faint crackles this morning - switch po lasix to IV today, monitor renal function  HCAP - continue zosyn and vancomycin - obtain cultures sputum and blood, NGTD - strep pneumo negative, legionella pending  COPD with exacerbation - added Pulmicort, continue Duonebs - continue prednisone, short taper, no longer wheezing today   HTN - blood pressure controlled - will continue with Diltiazem, Lopressor  HLD - will continue with statins  Atrial Fibrillation - will continue with cardizem and lopressor - continue with  apixaban   Diet: Diet heart healthy/carb modified Room service appropriate?: Yes; Fluid consistency:: Thin Fluids: none  DVT Prophylaxis: Eliquis  Code Status: Full Code Family Communication: no family bedside  Disposition Plan: home when ready   Consultants:  None   Procedures:  None    Antibiotics Vancomycin 10/6 >> Zosyn 10/6 >>   Studies  Ct Chest Wo Contrast  08/27/2015   CLINICAL DATA:  Shortness of breath 2 weeks.  Coughing.  Chest pain.  EXAM: CT CHEST WITHOUT CONTRAST  TECHNIQUE: Multidetector CT imaging of the chest was performed following the standard protocol without IV contrast.  COMPARISON:  None.  FINDINGS: The central airways are patent. There is a small left pleural effusion. There is left lower lobe airspace disease. There is lingular atelectasis and mild scarring. There a trace right pleural effusion. There are patchy areas of ground-glass opacity in the right upper lobe. There is mild reticular nodular interstitial disease at the right lung base. There is no pneumothorax.  There are no pathologically enlarged axillary, hilar or mediastinal lymph nodes.  The heart size is normal. There is no pericardial effusion. The thoracic aorta is normal in caliber. There is thoracic aortic atherosclerosis.  Review of bone windows demonstrates no focal lytic or sclerotic lesions.  Limited non-contrast images of the upper abdomen were obtained. There is high attenuation material dependently within the gallbladder which may represent cholelithiasis versus gallbladder sludge. The remainder of the visualized abdominal organs are unremarkable.  IMPRESSION: 1. Left lower lobe pneumonia with a small parapneumonic effusion. 2. Mild reticular nodular interstitial disease at the right lung base likely secondary to  an infectious etiology. Trace right pleural effusion.   Electronically Signed   By: Elige Ko   On: 08/27/2015 18:05   Dg Chest Port 1 View  08/27/2015   CLINICAL DATA:   Increased shortness of Breath  EXAM: PORTABLE CHEST - 1 VIEW  COMPARISON:  08/01/2015  FINDINGS: Cardiac shadow is at the upper limits of normal in size but stable from the prior study. Increasing infiltrative changes are noted in the bases bilaterally particularly in the left lung base when compare with the prior study. No sizable effusion is seen. No bony abnormality is noted.  IMPRESSION: Increasing bibasilar infiltrates left greater than right. Followup PA and lateral chest X-ray is recommended in 3-4 weeks following trial of antibiotic therapy to ensure resolution and exclude underlying malignancy.   Electronically Signed   By: Alcide Clever M.D.   On: 08/27/2015 14:36    Objective  Filed Vitals:   08/29/15 0751 08/29/15 1017 08/29/15 1021 08/29/15 1147  BP: 159/85     Pulse: 95     Temp:    97.5 F (36.4 C)  TempSrc:    Oral  Resp: 27     Height:      Weight:      SpO2: 93% 98% 98%     Intake/Output Summary (Last 24 hours) at 08/29/15 1222 Last data filed at 08/29/15 1147  Gross per 24 hour  Intake    800 ml  Output    725 ml  Net     75 ml   Filed Weights   08/27/15 2147 08/29/15 0500  Weight: 110.542 kg (243 lb 11.2 oz) 110.1 kg (242 lb 11.6 oz)   Exam:  GENERAL: NAD  HEENT: head NCAT, no scleral icterus. Pupils round and reactive.   NECK: Supple. No LAD  LUNGS: distant breath sounds, no wheezing, faint crackles  HEART: Regular rate and rhythm without murmur. 2+ pulses, no JVD, 1+ peripheral edema  ABDOMEN: Soft, non-distended, non-tender. Positive bowel sounds.  EXTREMITIES: Without any cyanosis or clubbing. Good muscle tone  NEUROLOGIC: Alert and oriented x3. Cranial nerves II through XII are grossly intact. Strength 5/5 in all 4.   Data Reviewed: Basic Metabolic Panel:  Recent Labs Lab 08/27/15 1400 08/27/15 2301 08/28/15 0350 08/29/15 0530  NA 143  --  142 143  K 3.9  --  4.1 4.2  CL 101  --  99* 99*  CO2 32  --  33* 34*  GLUCOSE 125*  --  144*  143*  BUN 18  --  16 23*  CREATININE 1.13 1.25* 1.15 1.07  CALCIUM 9.9  --  9.4 9.7   Liver Function Tests:  Recent Labs Lab 08/28/15 0350  AST 18  ALT 18  ALKPHOS 90  BILITOT 1.2  PROT 6.3*  ALBUMIN 2.9*   CBC:  Recent Labs Lab 08/27/15 1400 08/27/15 2301 08/28/15 0350 08/29/15 0530  WBC 4.8 4.5 4.6 5.4  HGB 14.3 13.8 13.7 12.9*  HCT 45.8 43.3 43.0 40.6  MCV 95.0 94.3 94.7 93.8  PLT 206 206 200 208   Cardiac Enzymes:  Recent Labs Lab 08/27/15 2301 08/28/15 0350 08/28/15 1000  TROPONINI <0.03 <0.03 <0.03   BNP (last 3 results)  Recent Labs  08/01/15 1451 08/17/15 1210 08/27/15 2301  BNP 75.0 103.3* 160.6*   CBG:  Recent Labs Lab 08/28/15 0730 08/29/15 0724  GLUCAP 162* 139*    Recent Results (from the past 240 hour(s))  Blood culture (routine x 2)  Status: None (Preliminary result)   Collection Time: 08/27/15  7:10 PM  Result Value Ref Range Status   Specimen Description BLOOD LEFT HAND  Final   Special Requests BOTTLES DRAWN AEROBIC AND ANAEROBIC  Final   Culture NO GROWTH 2 DAYS  Final   Report Status PENDING  Incomplete  Blood culture (routine x 2)     Status: None (Preliminary result)   Collection Time: 08/27/15  7:31 PM  Result Value Ref Range Status   Specimen Description BLOOD RIGHT ARM  Final   Special Requests BOTTLES DRAWN AEROBIC AND ANAEROBIC 5CC  Final   Culture NO GROWTH 2 DAYS  Final   Report Status PENDING  Incomplete  MRSA PCR Screening     Status: None   Collection Time: 08/27/15 10:03 PM  Result Value Ref Range Status   MRSA by PCR NEGATIVE NEGATIVE Final    Comment:        The GeneXpert MRSA Assay (FDA approved for NASAL specimens only), is one component of a comprehensive MRSA colonization surveillance program. It is not intended to diagnose MRSA infection nor to guide or monitor treatment for MRSA infections.   Culture, sputum-assessment     Status: None   Collection Time: 08/28/15  3:07 PM  Result  Value Ref Range Status   Specimen Description SPUTUM  Final   Special Requests NONE  Final   Sputum evaluation   Final    MICROSCOPIC FINDINGS SUGGEST THAT THIS SPECIMEN IS NOT REPRESENTATIVE OF LOWER RESPIRATORY SECRETIONS. PLEASE RECOLLECT. Gram Stain Report Called to,Read Back By and Verified With: J ODDONO,RN AT 1614 08/28/15 BY L BENFIELD    Report Status 08/28/2015 FINAL  Final     Scheduled Meds: . antiseptic oral rinse  7 mL Mouth Rinse BID  . apixaban  5 mg Oral BID  . budesonide (PULMICORT) nebulizer solution  0.25 mg Nebulization BID  . diltiazem  120 mg Oral Q12H  . folic acid  1 mg Oral Daily  . [START ON 08/30/2015] furosemide  20 mg Intravenous Daily  . guaiFENesin  1,200 mg Oral BID  . metoprolol  50 mg Oral Daily  . multivitamin with minerals  1 tablet Oral Daily  . piperacillin-tazobactam (ZOSYN)  IV  3.375 g Intravenous Q8H  . pravastatin  40 mg Oral Daily  . predniSONE  50 mg Oral Q breakfast  . sodium chloride  3 mL Intravenous Q12H  . sodium chloride  3 mL Intravenous Q12H  . thiamine  100 mg Oral Daily  . vancomycin  750 mg Intravenous Q12H   Continuous Infusions:    Pamella Pert, MD [facility] Pager [phone]. If 7 PM - 7 AM, please contact night-coverage at [email or web address], password [facility] 08/29/2015, 12:22 PM  LOS: 2 days

## 2015-08-30 ENCOUNTER — Inpatient Hospital Stay (HOSPITAL_COMMUNITY): Payer: Medicare Other

## 2015-08-30 DIAGNOSIS — R319 Hematuria, unspecified: Secondary | ICD-10-CM

## 2015-08-30 LAB — BASIC METABOLIC PANEL
Anion gap: 9 (ref 5–15)
BUN: 24 mg/dL — AB (ref 6–20)
CO2: 33 mmol/L — ABNORMAL HIGH (ref 22–32)
CREATININE: 1.07 mg/dL (ref 0.61–1.24)
Calcium: 9.2 mg/dL (ref 8.9–10.3)
Chloride: 96 mmol/L — ABNORMAL LOW (ref 101–111)
Glucose, Bld: 115 mg/dL — ABNORMAL HIGH (ref 65–99)
Potassium: 3.9 mmol/L (ref 3.5–5.1)
SODIUM: 138 mmol/L (ref 135–145)

## 2015-08-30 LAB — GLUCOSE, CAPILLARY: GLUCOSE-CAPILLARY: 115 mg/dL — AB (ref 65–99)

## 2015-08-30 MED ORDER — LEVOFLOXACIN 500 MG PO TABS: 500.0000 mg | ORAL_TABLET | Freq: Every day | ORAL | Status: DC

## 2015-08-30 MED ORDER — IOHEXOL 300 MG/ML  SOLN
100.0000 mL | Freq: Once | INTRAMUSCULAR | Status: AC | PRN
  Administered 2015-08-30: 100 mL via INTRAVENOUS

## 2015-08-30 MED ORDER — LEVOFLOXACIN 500 MG PO TABS
500.0000 mg | ORAL_TABLET | Freq: Every day | ORAL | Status: DC
  Administered 2015-08-30 – 2015-08-31 (×2): 500 mg via ORAL
  Filled 2015-08-30 (×2): qty 1

## 2015-08-30 NOTE — Progress Notes (Addendum)
ANTIBIOTIC CONSULT NOTE - Follow-up  Pharmacy Consult for vancomycin and Zosyn Indication: rule out pneumonia  No Known Allergies  Vital Signs: Temp: 97.7 F (36.5 C) (10/09 0500) BP: 152/63 mmHg (10/09 1050) Pulse Rate: 77 (10/09 1050) Intake/Output from previous day: 10/08 0701 - 10/09 0700 In: 320 [P.O.:120; IV Piggyback:200] Out: 950 [Urine:950] Intake/Output from this shift: Total I/O In: 123 [P.O.:120; I.V.:3] Out: 250 [Urine:250]  Labs:  Recent Labs  08/27/15 2301 08/28/15 0350 08/29/15 0530 08/30/15 0445  WBC 4.5 4.6 5.4  --   HGB 13.8 13.7 12.9*  --   PLT 206 200 208  --   CREATININE 1.25* 1.15 1.07 1.07   Estimated Creatinine Clearance: 68.1 mL/min (by C-G formula based on Cr of 1.07).  Recent Labs  08/29/15 0530 08/29/15 1707  VANCOTROUGH 25* 19     Assessment: 81 yo M continues on vancomycin for pneumonia (HAP: admitted 9/10-9/13).   WBC wnl, Afebrile, PCT negative, LA wnl, SCr stable at 1.07  Vanc 10/6>> Zosyn 10/6>>  10/8 VT = 25 on  IV Q12H however, lab was drawn just after the infusion was started  10/8 repeat VT = 19  10/6 Blood x 2 >> NG x3D 10/6 Strep UA -negative 10/6 Legionella >> negative 10/6 MRSA pcr negative  Goal of Therapy:  Vancomycin trough level 15-20 mcg/ml  Plan:  - Continue vancomycin 750 mg IV q12h - Continue Zosyn 3.375 g IV q8h - F/U LOT, cultures, renal fxn, clinical improvement  Ar[place]a Jansky, PharmD Clinical Pharmacy Resident Pager: [phone]  08/30/2015 11:24 AM  ADDENDUM ------------------------------------------------------------------------------------------------------------------ -Uvaldo Bristle D/C'd -Levofloxacin 500 mg po q24h started  [place]a Jansky, PharmD Clinical Pharmacy Resident Pager: [phone] 12:50 PM 08/30/2015

## 2015-08-30 NOTE — Consult Note (Signed)
Urology Consult   Physician requesting consult: Costin Otelia Sergeant, MD   Reason for consult: Hematuria  History of Present Illness: Logan West is a 81 y.o. who was admitted for COPD exacerbation. He was started on anticoagulation 1 month ago for a-fib and over the last 2 days has developed gross hematuria. The patient was passing clots but was always able to void. The patient reports that he quit smoking 20 years ago but has a >60 pack year h/o smoking. The patient has significant LUTS with frequency and urgency without dysuria. The patient has nocturia but can not name how often. The patient does not have trouble with UTIs and has never had a urologic procedure in the past.   Past Medical History  Diagnosis Date  . Hypertension     x30 years  . Hyperlipidemia     x 7-8  . COPD (chronic obstructive pulmonary disease) (HCC)   . Obesity   . Chronic pain     Past Surgical History  Procedure Laterality Date  . Percutaneous pinning      right forearm fracture  . Fracture surgery      right forearm    Current Hospital Medications:  Home Meds:    Medication List    ASK your doctor about these medications        albuterol 108 (90 BASE) MCG/ACT inhaler  Commonly known as:  PROVENTIL HFA;VENTOLIN HFA  Inhale 2 puffs into the lungs every 6 (six) hours as needed for wheezing or shortness of breath.     albuterol (2.5 MG/3ML) 0.083% nebulizer solution  Commonly known as:  PROVENTIL  Take 3 mLs (2.5 mg total) by nebulization every 6 (six) hours as needed for wheezing or shortness of breath.     amLODipine 10 MG tablet  Commonly known as:  NORVASC  TAKE 1 TABLET (10 MG TOTAL) BY MOUTH DAILY.     apixaban 5 MG Tabs tablet  Commonly known as:  ELIQUIS  Take 1 tablet (5 mg total) by mouth 2 (two) times daily.     budesonide-formoterol 160-4.5 MCG/ACT inhaler  Commonly known as:  SYMBICORT  Inhale 2 puffs into the lungs 2 (two) times daily.     diltiazem 120 MG 12 hr capsule   Commonly known as:  CARDIZEM SR  Take 1 capsule (120 mg total) by mouth every 12 (twelve) hours.     furosemide 20 MG tablet  Commonly known as:  LASIX  TAKE 1 TABLET (20 MG TOTAL) BY MOUTH DAILY.     guaiFENesin 600 MG 12 hr tablet  Commonly known as:  MUCINEX  Take 2 tablets (1,200 mg total) by mouth 2 (two) times daily.     LORazepam 1 MG tablet  Commonly known as:  ATIVAN  Take 1 tablet (1 mg total) by mouth every 8 (eight) hours as needed for anxiety.     metoprolol 50 MG tablet  Commonly known as:  LOPRESSOR  TAKE 1 TABLET (50 MG TOTAL) BY MOUTH DAILY.     pravastatin 40 MG tablet  Commonly known as:  PRAVACHOL  TAKE 1 TABLET (40 MG TOTAL) BY MOUTH DAILY.     predniSONE 10 MG tablet  Commonly known as:  DELTASONE  take  po daily for 2 days then  daily for 2 days then  daily for 2 days then  daily for 2 days then stop     traMADol 50 MG tablet  Commonly known as:  ULTRAM  TAKE 1 TABLET BY  MOUTH EVERY eight HOURS AS NEEDED FOR MODERATE PAIN        Scheduled Meds: . antiseptic oral rinse  7 mL Mouth Rinse BID  . budesonide (PULMICORT) nebulizer solution  0.25 mg Nebulization BID  . diltiazem  120 mg Oral Q12H  . folic acid  1 mg Oral Daily  . furosemide  20 mg Intravenous Daily  . guaiFENesin  1,200 mg Oral BID  . levofloxacin  500 mg Oral Daily  . metoprolol  50 mg Oral Daily  . multivitamin with minerals  1 tablet Oral Daily  . pravastatin  40 mg Oral Daily  . predniSONE  50 mg Oral Q breakfast  . sodium chloride  3 mL Intravenous Q12H  . sodium chloride  3 mL Intravenous Q12H  . thiamine  100 mg Oral Daily   Continuous Infusions:  PRN Meds:.sodium chloride, acetaminophen **OR** acetaminophen, albuterol, LORazepam, ondansetron **OR** ondansetron (ZOFRAN) IV, sodium chloride, traMADol  Allergies: No Known Allergies  Family History  Problem Relation Age of Onset  . Cancer Mother   . Asthma Father   . Heart attack Brother     same  mother.  MI in his 51s  . Coronary artery disease Other     Social History:  reports that he quit smoking about 24 years ago. His smoking use included Cigarettes. He has a 62.5 pack-year smoking history. He does not have any smokeless tobacco history on file. His alcohol and drug histories are not on file.  ROS: A complete review of systems was performed.  All systems are negative except for pertinent findings as noted.  Physical Exam:  Vital signs in last 24 hours: Temp:  [97.7 F (36.5 C)-98.3 F (36.8 C)] 97.7 F (36.5 C) (10/09 0500) Pulse Rate:  [77-90] 77 (10/09 1050) Resp:  [24-28] 25 (10/09 0500) BP: (116-152)/(58-80) 152/63 mmHg (10/09 1050) SpO2:  [95 %-97 %] 97 % (10/09 0846) Weight:  [112.537 kg (248 lb 1.6 oz)] 112.537 kg (248 lb 1.6 oz) (10/09 0500) Constitutional:  Alert and oriented, No acute distress Cardiovascular: Regular rate and rhythm, No JVD Respiratory: Normal respiratory effort GI: Abdomen is soft, nontender, nondistended, no abdominal masses GU: Urine is clear without clot in urinal at bedside Lymphatic: No lymphadenopathy Neurologic: Grossly intact, no focal deficits Psychiatric: Normal mood and affect  Laboratory Data:   Recent Labs  08/27/15 1400 08/27/15 2301 08/28/15 0350 08/29/15 0530  WBC 4.8 4.5 4.6 5.4  HGB 14.3 13.8 13.7 12.9*  HCT 45.8 43.3 43.0 40.6  PLT 206 206 200 208     Recent Labs  08/27/15 1400 08/27/15 2301 08/28/15 0350 08/29/15 0530 08/30/15 0445  NA 143  --  142 143 138  K 3.9  --  4.1 4.2 3.9  CL 101  --  99* 99* 96*  GLUCOSE 125*  --  144* 143* 115*  BUN 18  --  16 23* 24*  CALCIUM 9.9  --  9.4 9.7 9.2  CREATININE 1.13 1.25* 1.15 1.07 1.07     Results for orders placed or performed during the hospital encounter of 08/27/15 (from the past 24 hour(s))  Urinalysis, Routine w reflex microscopic (not at [facility])     Status: Abnormal   Collection Time: 08/29/15  3:27 PM  Result Value Ref Range   Color, Urine RED  (A) YELLOW   APPearance TURBID (A) CLEAR   Specific Gravity, Urine 1.021 1.005 - 1.030   pH 5.0 5.0 - 8.0   Glucose, UA NEGATIVE NEGATIVE  mg/dL   Hgb urine dipstick LARGE (A) NEGATIVE   Bilirubin Urine MODERATE (A) NEGATIVE   Ketones, ur 15 (A) NEGATIVE mg/dL   Protein, ur 952 (A) NEGATIVE mg/dL   Urobilinogen, UA 1.0 0.0 - 1.0 mg/dL   Nitrite POSITIVE (A) NEGATIVE   Leukocytes, UA MODERATE (A) NEGATIVE  Urine microscopic-add on     Status: Abnormal   Collection Time: 08/29/15  3:27 PM  Result Value Ref Range   RBC / HPF TOO NUMEROUS TO COUNT <3 RBC/hpf   Bacteria, UA FEW (A) RARE  Vancomycin, trough     Status: None   Collection Time: 08/29/15  5:07 PM  Result Value Ref Range   Vancomycin Tr 19 10.0 - 20.0 ug/mL  Basic metabolic panel     Status: Abnormal   Collection Time: 08/30/15  4:45 AM  Result Value Ref Range   Sodium 138 135 - 145 mmol/L   Potassium 3.9 3.5 - 5.1 mmol/L   Chloride 96 (L) 101 - 111 mmol/L   CO2 33 (H) 22 - 32 mmol/L   Glucose, Bld 115 (H) 65 - 99 mg/dL   BUN 24 (H) 6 - 20 mg/dL   Creatinine, Ser 8.41 0.61 - 1.24 mg/dL   Calcium 9.2 8.9 - 32.4 mg/dL   GFR calc non Af Amer >60 >60 mL/min   GFR calc Af Amer >60 >60 mL/min   Anion gap 9 5 - 15  Glucose, capillary     Status: Abnormal   Collection Time: 08/30/15  7:35 AM  Result Value Ref Range   Glucose-Capillary 115 (H) 65 - 99 mg/dL   Comment 1 Document in Chart    Recent Results (from the past 240 hour(s))  Blood culture (routine x 2)     Status: None (Preliminary result)   Collection Time: 08/27/15  7:10 PM  Result Value Ref Range Status   Specimen Description BLOOD LEFT HAND  Final   Special Requests BOTTLES DRAWN AEROBIC AND ANAEROBIC  Final   Culture NO GROWTH 3 DAYS  Final   Report Status PENDING  Incomplete  Blood culture (routine x 2)     Status: None (Preliminary result)   Collection Time: 08/27/15  7:31 PM  Result Value Ref Range Status   Specimen Description BLOOD RIGHT ARM   Final   Special Requests BOTTLES DRAWN AEROBIC AND ANAEROBIC 5CC  Final   Culture NO GROWTH 3 DAYS  Final   Report Status PENDING  Incomplete  MRSA PCR Screening     Status: None   Collection Time: 08/27/15 10:03 PM  Result Value Ref Range Status   MRSA by PCR NEGATIVE NEGATIVE Final    Comment:        The GeneXpert MRSA Assay (FDA approved for NASAL specimens only), is one component of a comprehensive MRSA colonization surveillance program. It is not intended to diagnose MRSA infection nor to guide or monitor treatment for MRSA infections.   Culture, sputum-assessment     Status: None   Collection Time: 08/28/15  3:07 PM  Result Value Ref Range Status   Specimen Description SPUTUM  Final   Special Requests NONE  Final   Sputum evaluation   Final    MICROSCOPIC FINDINGS SUGGEST THAT THIS SPECIMEN IS NOT REPRESENTATIVE OF LOWER RESPIRATORY SECRETIONS. PLEASE RECOLLECT. Gram Stain Report Called to,Read Back By and Verified With: J ODDONO,RN AT 1614 08/28/15 BY L BENFIELD    Report Status 08/28/2015 FINAL  Final    Renal Function:  Recent Labs  08/27/15 1400 08/27/15 2301 08/28/15 0350 08/29/15 0530 08/30/15 0445  CREATININE 1.13 1.25* 1.15 1.07 1.07   Estimated Creatinine Clearance: 68.1 mL/min (by C-G formula based on Cr of 1.07).  Radiologic Imaging: No results found.  I independently reviewed the above imaging studies.  Impression/Recommendation 81 y.o. male with gross hematuria after starting anticoagulation for a-fib. The patient has baseline voiding trouble with frequency and urgency, likely BPH related but could be irritative voiding symptoms from malignancy or other cause. The patient needs a hematuria evaluation to rule out malignancy. This would include a CT urogram with delayed phase to evaluate upper tracts. He will also need outpatient evaluation with cystoscopy. The patient would likely benefit from Flomax and should have a trial of this for LUTS. We will  follow the CT urogram results. We should be contacted if the patient's hematuria worsens and the patient is unable to empty his bladder.   Juliane Poot 08/30/2015, 1:32 PM

## 2015-08-30 NOTE — Progress Notes (Signed)
PROGRESS NOTE  Logan West ZOX:[id number] DOB: Aug 27, 1934 DOA: 08/27/2015 PCP: Mechele Claude, MD  HPI: Logan West is a 81 y.o. male with prior history of COPD HTN HLD presents with increased shortness of breath. Patient has noted increased shortness of breath for a couple weeks. He states that he was recently in the hospital for same complaints at [facility]. Patient states that he has not been able to walk even on level grounds. He states he also has a history of CHF and atrial fibrillation. Patient has had edema also. He states he has no cough noted aside from his baseline. He states has no chest pain noted.  Subjective / 24 H Interval events - feels more short of breath today - denies chest pain / palpitations  Assessment/Plan: Active Problems:   Essential hypertension   HLD (hyperlipidemia)   Acute on chronic respiratory failure with hypoxemia (HCC)   COPD exacerbation (HCC)   HCAP (healthcare-associated pneumonia)   Acute on chronic combined systolic and diastolic heart failure (HCC)   Atrial fibrillation (HCC)   Acute on chronic Respiratory failure with hypoxia - improving - multifactorial due to COPD exacerbation / HCAP, oxygen as needed - continue antibiotics, supportive treatment, transition to Levaquin - IV Lasix   Acute on chronic combined systolic and diastolic CHF - received fluids on admission, still has LE edema - continue IV lasix, renal function stable  Hematuria - patient with worsening hematuria yesterday, passing blood clots - consulted Urology, appreciate input - discontinue Eliquis for now  HCAP - afebrile, transition to Levaquin - obtain cultures sputum and blood, NGTD - strep pneumo negative, legionella negative  COPD with exacerbation - added Pulmicort, continue Duonebs - continue prednisone, short taper, no longer wheezing today   HTN - blood pressure controlled - will continue with Diltiazem, Lopressor  HLD - will continue  with statins  Atrial Fibrillation - will continue with cardizem and lopressor - discontinue Eliquis as above   Diet: Diet heart healthy/carb modified Room service appropriate?: Yes; Fluid consistency:: Thin Fluids: none  DVT Prophylaxis: Lovenox tomorrow as today got eliquis  Code Status: Full Code Family Communication: no family bedside  Disposition Plan: home when ready   Consultants:  None   Procedures:  None    Antibiotics Vancomycin 10/6 >> 10/9 Zosyn 10/6 >> 10/9 Levofloxacin 10/9 >>   Studies  No results found.  Objective  Filed Vitals:   08/30/15 0500 08/30/15 0842 08/30/15 0846 08/30/15 1050  BP: 127/58   152/63  Pulse: 90   77  Temp: 97.7 F (36.5 C)     TempSrc:      Resp: 25     Height:      Weight: 112.537 kg (248 lb 1.6 oz)     SpO2: 96% 97% 97%     Intake/Output Summary (Last 24 hours) at 08/30/15 1103 Last data filed at 08/30/15 1049  Gross per 24 hour  Intake    323 ml  Output   1075 ml  Net   -752 ml   Filed Weights   08/27/15 2147 08/29/15 0500 08/30/15 0500  Weight: 110.542 kg (243 lb 11.2 oz) 110.1 kg (242 lb 11.6 oz) 112.537 kg (248 lb 1.6 oz)   Exam:  GENERAL: NAD  HEENT: head NCAT, no scleral icterus. Pupils round and reactive.   NECK: Supple. No LAD  LUNGS: distant breath sounds, no wheezing, faint crackles  HEART: Regular rate and rhythm without murmur. 2+ pulses, no JVD, 1+ peripheral edema  ABDOMEN:  Soft, non-distended, non-tender. Positive bowel sounds.  EXTREMITIES: Without any cyanosis or clubbing. Good muscle tone  NEUROLOGIC: Alert and oriented x3. Cranial nerves II through XII are grossly intact. Strength 5/5 in all 4.   Data Reviewed: Basic Metabolic Panel:  Recent Labs Lab 08/27/15 1400 08/27/15 2301 08/28/15 0350 08/29/15 0530 08/30/15 0445  NA 143  --  142 143 138  K 3.9  --  4.1 4.2 3.9  CL 101  --  99* 99* 96*  CO2 32  --  33* 34* 33*  GLUCOSE 125*  --  144* 143* 115*  BUN 18  --   16 23* 24*  CREATININE 1.13 1.25* 1.15 1.07 1.07  CALCIUM 9.9  --  9.4 9.7 9.2   Liver Function Tests:  Recent Labs Lab 08/28/15 0350  AST 18  ALT 18  ALKPHOS 90  BILITOT 1.2  PROT 6.3*  ALBUMIN 2.9*   CBC:  Recent Labs Lab 08/27/15 1400 08/27/15 2301 08/28/15 0350 08/29/15 0530  WBC 4.8 4.5 4.6 5.4  HGB 14.3 13.8 13.7 12.9*  HCT 45.8 43.3 43.0 40.6  MCV 95.0 94.3 94.7 93.8  PLT 206 206 200 208   Cardiac Enzymes:  Recent Labs Lab 08/27/15 2301 08/28/15 0350 08/28/15 1000  TROPONINI <0.03 <0.03 <0.03   BNP (last 3 results)  Recent Labs  08/01/15 1451 08/17/15 1210 08/27/15 2301  BNP 75.0 103.3* 160.6*   CBG:  Recent Labs Lab 08/28/15 0730 08/29/15 0724 08/30/15 0735  GLUCAP 162* 139* 115*    Recent Results (from the past 240 hour(s))  Blood culture (routine x 2)     Status: None (Preliminary result)   Collection Time: 08/27/15  7:10 PM  Result Value Ref Range Status   Specimen Description BLOOD LEFT HAND  Final   Special Requests BOTTLES DRAWN AEROBIC AND ANAEROBIC  Final   Culture NO GROWTH 3 DAYS  Final   Report Status PENDING  Incomplete  Blood culture (routine x 2)     Status: None (Preliminary result)   Collection Time: 08/27/15  7:31 PM  Result Value Ref Range Status   Specimen Description BLOOD RIGHT ARM  Final   Special Requests BOTTLES DRAWN AEROBIC AND ANAEROBIC 5CC  Final   Culture NO GROWTH 3 DAYS  Final   Report Status PENDING  Incomplete  MRSA PCR Screening     Status: None   Collection Time: 08/27/15 10:03 PM  Result Value Ref Range Status   MRSA by PCR NEGATIVE NEGATIVE Final    Comment:        The GeneXpert MRSA Assay (FDA approved for NASAL specimens only), is one component of a comprehensive MRSA colonization surveillance program. It is not intended to diagnose MRSA infection nor to guide or monitor treatment for MRSA infections.   Culture, sputum-assessment     Status: None   Collection Time: 08/28/15   3:07 PM  Result Value Ref Range Status   Specimen Description SPUTUM  Final   Special Requests NONE  Final   Sputum evaluation   Final    MICROSCOPIC FINDINGS SUGGEST THAT THIS SPECIMEN IS NOT REPRESENTATIVE OF LOWER RESPIRATORY SECRETIONS. PLEASE RECOLLECT. Gram Stain Report Called to,Read Back By and Verified With: J ODDONO,RN AT 1614 08/28/15 BY L BENFIELD    Report Status 08/28/2015 FINAL  Final     Scheduled Meds: . antiseptic oral rinse  7 mL Mouth Rinse BID  . apixaban  5 mg Oral BID  . budesonide (PULMICORT) nebulizer solution  0.25 mg Nebulization BID  . diltiazem  120 mg Oral Q12H  . folic acid  1 mg Oral Daily  . furosemide  20 mg Intravenous Daily  . guaiFENesin  1,200 mg Oral BID  . metoprolol  50 mg Oral Daily  . multivitamin with minerals  1 tablet Oral Daily  . piperacillin-tazobactam (ZOSYN)  IV  3.375 g Intravenous Q8H  . pravastatin  40 mg Oral Daily  . predniSONE  50 mg Oral Q breakfast  . sodium chloride  3 mL Intravenous Q12H  . sodium chloride  3 mL Intravenous Q12H  . thiamine  100 mg Oral Daily  . vancomycin  750 mg Intravenous Q12H   Continuous Infusions:    Pamella Pert, MD [facility] Pager [phone]. If 7 PM - 7 AM, please contact night-coverage at [email or web address], password [facility] 08/30/2015, 11:03 AM  LOS: 3 days

## 2015-08-31 LAB — CBC
HEMATOCRIT: 41.6 % (ref 39.0–52.0)
HEMOGLOBIN: 13.1 g/dL (ref 13.0–17.0)
MCH: 29.5 pg (ref 26.0–34.0)
MCHC: 31.5 g/dL (ref 30.0–36.0)
MCV: 93.7 fL (ref 78.0–100.0)
Platelets: 213 10*3/uL (ref 150–400)
RBC: 4.44 MIL/uL (ref 4.22–5.81)
RDW: 13.6 % (ref 11.5–15.5)
WBC: 4.7 10*3/uL (ref 4.0–10.5)

## 2015-08-31 LAB — BASIC METABOLIC PANEL
ANION GAP: 6 (ref 5–15)
BUN: 24 mg/dL — ABNORMAL HIGH (ref 6–20)
CHLORIDE: 95 mmol/L — AB (ref 101–111)
CO2: 36 mmol/L — AB (ref 22–32)
Calcium: 9 mg/dL (ref 8.9–10.3)
Creatinine, Ser: 0.97 mg/dL (ref 0.61–1.24)
GFR calc non Af Amer: >60 mL/min (ref >60)
Glucose, Bld: 107 mg/dL — ABNORMAL HIGH (ref 65–99)
POTASSIUM: 4.1 mmol/L (ref 3.5–5.1)
Sodium: 137 mmol/L (ref 135–145)

## 2015-08-31 LAB — GLUCOSE, CAPILLARY: GLUCOSE-CAPILLARY: 107 mg/dL — AB (ref 65–99)

## 2015-08-31 MED ORDER — FUROSEMIDE 20 MG PO TABS
20.0000 mg | ORAL_TABLET | Freq: Every day | ORAL | Status: DC
  Administered 2015-09-01: 20 mg via ORAL
  Filled 2015-08-31: qty 1

## 2015-08-31 NOTE — Progress Notes (Signed)
Physical Therapy Treatment Patient Details Name: Logan West MRN: [id number] DOB: 1933/12/25 Today's Date: 08/31/2015    History of Present Illness Logan West is a 81 y.o. male with prior history of COPD HTN HLD CHF aFib presents with increased shortness of breath.  Admitted for HCAP, aflutter and CHF.    PT Comments    Progressing steadily.  Needs 4 L [place] to maintain upper 80's during ambulation.  Follow Up Recommendations  No PT follow up     Equipment Recommendations  None recommended by PT    Recommendations for Other Services       Precautions / Restrictions Precautions Precaution Comments: watch O2 sats    Mobility  Bed Mobility                  Transfers Overall transfer level: Modified independent               General transfer comment: up from recliner  Ambulation/Gait Ambulation/Gait assistance: Supervision Ambulation Distance (Feet): 90 Feet (x2 pulling O2 tank)   Gait Pattern/deviations: Step-through pattern   Gait velocity interpretation: Below normal speed for age/gender General Gait Details: generally steady see comments for sats on 4L [place]   Stairs            Wheelchair Mobility    Modified Rankin (Stroke Patients Only)       Balance             Standing balance-Leahy Scale: Good                      Cognition Arousal/Alertness: Awake/alert Behavior During Therapy: WFL for tasks assessed/performed Overall Cognitive Status: Within Functional Limits for tasks assessed                      Exercises      General Comments General comments (skin integrity, edema, etc.): SpO2 on 4L after 90' at 85% with 2 min standing recovery at up to 102 bpm.  On return sats at 93% and then took a dive to 88% with EHR 93%      Pertinent Vitals/Pain Pain Assessment: No/denies pain    Home Living                      Prior Function            PT Goals (current goals can now be found in the care  plan section) Acute Rehab PT Goals Patient Stated Goal: To breathe better PT Goal Formulation: With patient Time For Goal Achievement: 09/04/15 Potential to Achieve Goals: Good Progress towards PT goals: Progressing toward goals    Frequency  Min 3X/week    PT Plan Current plan remains appropriate    Co-evaluation             End of Session Equipment Utilized During Treatment: Oxygen Activity Tolerance: Patient tolerated treatment well;Patient limited by fatigue Patient left: in chair;with call bell/phone within reach;with chair alarm set     Time: 1715-1740 PT Time Calculation (min) (ACUTE ONLY): 25 min  Charges:  $Gait Training: 8-22 mins $Therapeutic Activity: 8-22 mins                    G Codes:      Logan West, Logan West 08/31/2015, 5:50 PM 08/31/2015   Bing, PT [phone]  (pager)

## 2015-08-31 NOTE — Care Management Important Message (Signed)
Important Message  Patient Details  Name: Logan West MRN: [id number] Date of Birth: 10/29/1934   Medicare Important Message Given:  Yes-second notification given    Kyla Balzarine 08/31/2015, 11:41 AM

## 2015-08-31 NOTE — Progress Notes (Signed)
PROGRESS NOTE  Aydeen Blume ZOX:[id number] DOB: 05-21-1934 DOA: 08/27/2015 PCP: Mechele Claude, MD  HPI: Logan West is a 81 y.o. male with prior history of COPD HTN HLD presents with increased shortness of breath. Patient has noted increased shortness of breath for a couple weeks. He states that he was recently in the hospital for same complaints at [facility]. Patient states that he has not been able to walk even on level grounds. He states he also has a history of CHF and atrial fibrillation. Patient has had edema also. He states he has no cough noted aside from his baseline. He states has no chest pain noted.  Subjective / 24 H Interval events - feels like his breathing is improving  Assessment/Plan: Active Problems:   Essential hypertension   HLD (hyperlipidemia)   Acute on chronic respiratory failure with hypoxemia (HCC)   COPD exacerbation (HCC)   HCAP (healthcare-associated pneumonia)   Acute on chronic combined systolic and diastolic heart failure (HCC)   Atrial fibrillation (HCC)   Acute on chronic Respiratory failure with hypoxia - improving - multifactorial due to COPD exacerbation / HCAP, oxygen as needed - continue antibiotics, supportive treatment, transitioned to Levaquin - IV Lasix x 2 days, clinically LE swelling improving however I&Os do not reflect that ? Accuracy. Transition to po Lasix.  Acute on chronic combined systolic and diastolic CHF - received fluids on admission, LE edema improving - continue IV lasix, renal function stable  Hematuria - patient with worsening hematuria 10/8, passing blood clots - consulted Urology, appreciate input, no clear etiology per CT scan, outpatient evaluation - discontinue Eliquis for now, I have discussed with cardiology   HCAP - afebrile, transition to Levaquin - obtain cultures sputum and blood, NGTD - strep pneumo negative, legionella negative  COPD with exacerbation - added Pulmicort, continue Duonebs -  continue prednisone, short taper, no longer wheezing   HTN - blood pressure controlled - will continue with Diltiazem, Lopressor  HLD - will continue with statins  Atrial Fibrillation - will continue with cardizem and lopressor - discontinue Eliquis as above   Diet: Diet heart healthy/carb modified Room service appropriate?: Yes; Fluid consistency:: Thin Fluids: none  DVT Prophylaxis: SCD  Code Status: Full Code Family Communication: no family bedside  Disposition Plan: home 1 day, respiratory status not at baseline  Consultants:  None   Procedures:  None    Antibiotics Vancomycin 10/6 >> 10/9 Zosyn 10/6 >> 10/9 Levofloxacin 10/9 >>   Studies  Ct Abdomen Pelvis W Wo Contrast  08/30/2015   CLINICAL DATA:  81 year old male with chronic abdominal pain. Obesity. Hyperlipidemia. Hematuria.  EXAM: CT ABDOMEN AND PELVIS WITHOUT AND WITH CONTRAST  TECHNIQUE: Multidetector CT imaging of the abdomen and pelvis was performed following the standard protocol before and following the bolus administration of intravenous contrast.  CONTRAST:  OMNIPAQUE IOHEXOL 300 MG/ML  SOLN  COMPARISON:  CT of the abdomen and pelvis 08/03/2015.  FINDINGS: Lower chest: Areas of dependent subsegmental atelectasis and/or scarring in the lower lobes of the lungs bilaterally (left). Small left pleural effusion and trace right-sided pleural effusion both layering dependently, new compared to prior study.  Hepatobiliary: A few scattered calcified granulomas are noted in the liver. No suspicious cystic or solid hepatic lesions. Mobile high attenuation material lies dependently in the gallbladder, compatible with biliary sludge and/or tiny gallstones. No findings to suggest an acute cholecystitis at this time.  Pancreas: No pancreatic mass. No pancreatic ductal dilatation. No pancreatic or peripancreatic fluid  or inflammatory changes.  Spleen: Normal appearance of the spleen.  Adrenals/Urinary Tract: No  calcifications are identified within the collecting system of either kidney, along the course of either ureter, or within the lumen of the urinary bladder. No suspicious cystic or solid renal lesions are identified on post-contrast images. No hydroureteronephrosis. Postcontrast delayed images demonstrate no definite filling defects within the collecting system of either kidney, along the well opacified portions of either ureter (much of both ureters are incompletely opacified), or within the lumen of the urinary bladder to strongly suggest presence of a urothelial neoplasm at this time. Small bladder wall diverticulum noted on the right side. Urinary bladder is otherwise normal in appearance. Bilateral adrenal glands are normal in appearance.  Stomach/Bowel: Normal appearance of the stomach. No pathologic dilatation of small bowel or colon. Normal appendix.  Vascular/Lymphatic: Extensive atherosclerosis throughout the abdominal and pelvic vasculature, without evidence of aneurysm or dissection. Retroaortic left renal vein. No lymphadenopathy noted in the abdomen or pelvis.  Reproductive: Prostate gland and seminal vesicles are unremarkable in appearance.  Other: Tiny umbilical hernia containing only omental fat. No findings to suggest associated bowel incarceration or obstruction at this time. No significant volume of ascites. No pneumoperitoneum.  Musculoskeletal: There are no aggressive appearing lytic or blastic lesions noted in the visualized portions of the skeleton.  IMPRESSION: 1. No explanation for the patient's history of hematuria. 2. No acute findings in the abdomen or pelvis to account for the patient's history of abdominal pain. 3. Biliary sludge and/or tiny gallstones lying dependently in the gallbladder. No current findings to suggest an acute cholecystitis at this time. 4. Interval development of small left and trace right-sided pleural effusions with worsening areas of atelectasis and/or scarring in  the lung bases bilaterally (left greater than right). 5. Normal appendix. 6. Extensive atherosclerosis. 7. Tiny umbilical hernia containing only omental fat.   Electronically Signed   By: Trudie Reed M.D.   On: 08/30/2015 16:19    Objective  Filed Vitals:   08/31/15 0500 08/31/15 0925 08/31/15 0928 08/31/15 0948  BP: 127/69 124/59 124/59   Pulse: 72  84   Temp: 97.7 F (36.5 C)     TempSrc:      Resp: 21     Height:      Weight:      SpO2: 100%   98%    Intake/Output Summary (Last 24 hours) at 08/31/15 1258 Last data filed at 08/31/15 0900  Gross per 24 hour  Intake    360 ml  Output    910 ml  Net   -550 ml   Filed Weights   08/27/15 2147 08/29/15 0500 08/30/15 0500  Weight: 110.542 kg (243 lb 11.2 oz) 110.1 kg (242 lb 11.6 oz) 112.537 kg (248 lb 1.6 oz)   Exam:  GENERAL: NAD  HEENT: head NCAT, no scleral icterus. Pupils round and reactive.   NECK: Supple. No LAD  LUNGS: distant breath sounds, no wheezing, faint crackles  HEART: Regular rate and rhythm without murmur. 2+ pulses, no JVD, 1+ peripheral edema  ABDOMEN: Soft, non-distended, non-tender. Positive bowel sounds.  EXTREMITIES: Without any cyanosis or clubbing. Good muscle tone  NEUROLOGIC: Alert and oriented x3. Cranial nerves II through XII are grossly intact. Strength 5/5 in all 4.   Data Reviewed: Basic Metabolic Panel:  Recent Labs Lab 08/27/15 1400 08/27/15 2301 08/28/15 0350 08/29/15 0530 08/30/15 0445 08/31/15 0325  NA 143  --  142 143 138 137  K 3.9  --  4.1 4.2 3.9 4.1  CL 101  --  99* 99* 96* 95*  CO2 32  --  33* 34* 33* 36*  GLUCOSE 125*  --  144* 143* 115* 107*  BUN 18  --  16 23* 24* 24*  CREATININE 1.13 1.25* 1.15 1.07 1.07 0.97  CALCIUM 9.9  --  9.4 9.7 9.2 9.0   Liver Function Tests:  Recent Labs Lab 08/28/15 0350  AST 18  ALT 18  ALKPHOS 90  BILITOT 1.2  PROT 6.3*  ALBUMIN 2.9*   CBC:  Recent Labs Lab 08/27/15 1400 08/27/15 2301 08/28/15 0350  08/29/15 0530 08/31/15 0325  WBC 4.8 4.5 4.6 5.4 4.7  HGB 14.3 13.8 13.7 12.9* 13.1  HCT 45.8 43.3 43.0 40.6 41.6  MCV 95.0 94.3 94.7 93.8 93.7  PLT 206 206 200 208 213   Cardiac Enzymes:  Recent Labs Lab 08/27/15 2301 08/28/15 0350 08/28/15 1000  TROPONINI <0.03 <0.03 <0.03   BNP (last 3 results)  Recent Labs  08/01/15 1451 08/17/15 1210 08/27/15 2301  BNP 75.0 103.3* 160.6*   CBG:  Recent Labs Lab 08/28/15 0730 08/29/15 0724 08/30/15 0735 08/31/15 0732  GLUCAP 162* 139* 115* 107*    Recent Results (from the past 240 hour(s))  Blood culture (routine x 2)     Status: None (Preliminary result)   Collection Time: 08/27/15  7:10 PM  Result Value Ref Range Status   Specimen Description BLOOD LEFT HAND  Final   Special Requests BOTTLES DRAWN AEROBIC AND ANAEROBIC  Final   Culture NO GROWTH 3 DAYS  Final   Report Status PENDING  Incomplete  Blood culture (routine x 2)     Status: None (Preliminary result)   Collection Time: 08/27/15  7:31 PM  Result Value Ref Range Status   Specimen Description BLOOD RIGHT ARM  Final   Special Requests BOTTLES DRAWN AEROBIC AND ANAEROBIC 5CC  Final   Culture NO GROWTH 3 DAYS  Final   Report Status PENDING  Incomplete  MRSA PCR Screening     Status: None   Collection Time: 08/27/15 10:03 PM  Result Value Ref Range Status   MRSA by PCR NEGATIVE NEGATIVE Final    Comment:        The GeneXpert MRSA Assay (FDA approved for NASAL specimens only), is one component of a comprehensive MRSA colonization surveillance program. It is not intended to diagnose MRSA infection nor to guide or monitor treatment for MRSA infections.   Culture, sputum-assessment     Status: None   Collection Time: 08/28/15  3:07 PM  Result Value Ref Range Status   Specimen Description SPUTUM  Final   Special Requests NONE  Final   Sputum evaluation   Final    MICROSCOPIC FINDINGS SUGGEST THAT THIS SPECIMEN IS NOT REPRESENTATIVE OF LOWER RESPIRATORY  SECRETIONS. PLEASE RECOLLECT. Gram Stain Report Called to,Read Back By and Verified With: J ODDONO,RN AT 1614 08/28/15 BY L BENFIELD    Report Status 08/28/2015 FINAL  Final     Scheduled Meds: . antiseptic oral rinse  7 mL Mouth Rinse BID  . budesonide (PULMICORT) nebulizer solution  0.25 mg Nebulization BID  . diltiazem  120 mg Oral Q12H  . folic acid  1 mg Oral Daily  . furosemide  20 mg Intravenous Daily  . guaiFENesin  1,200 mg Oral BID  . levofloxacin  500 mg Oral Daily  . metoprolol  50 mg Oral Daily  . multivitamin with minerals  1 tablet Oral  Daily  . pravastatin  40 mg Oral Daily  . predniSONE  50 mg Oral Q breakfast  . sodium chloride  3 mL Intravenous Q12H  . sodium chloride  3 mL Intravenous Q12H  . thiamine  100 mg Oral Daily   Continuous Infusions:    Pamella Pert, MD [facility] Pager [phone]. If 7 PM - 7 AM, please contact night-coverage at [email or web address], password [facility] 08/31/2015, 12:58 PM  LOS: 4 days

## 2015-08-31 NOTE — Progress Notes (Signed)
Urology Inpatient Progress Report SOB (shortness of breath) [R06.02] HCAP (healthcare-associated pneumonia) [J18.9] 08/27/2015 Hematuria Intv/Subj: No acute events overnight. Patient is without complaint. CT scan demonstrated no clear etiology of his hematuria.  Past Medical History  Diagnosis Date  . Hypertension     x30 years  . Hyperlipidemia     x 7-8  . COPD (chronic obstructive pulmonary disease) (HCC)   . Obesity   . Chronic pain    Current Facility-Administered Medications  Medication Dose Route Frequency Provider Last Rate Last Dose  . 0.9 %  sodium chloride infusion  250 mL Intravenous PRN Yevonne Pax, MD      . acetaminophen (TYLENOL) tablet 650 mg  650 mg Oral Q6H PRN Yevonne Pax, MD       Or  . acetaminophen (TYLENOL) suppository 650 mg  650 mg Rectal Q6H PRN Yevonne Pax, MD      . albuterol (PROVENTIL) (2.5 MG/3ML) 0.083% nebulizer solution 2.5 mg  2.5 mg Nebulization Q6H PRN Yevonne Pax, MD   2.5 mg at 08/29/15 1020  . antiseptic oral rinse (CPC / CETYLPYRIDINIUM CHLORIDE 0.05%) solution 7 mL  7 mL Mouth Rinse BID Yevonne Pax, MD   7 mL at 08/30/15 2200  . budesonide (PULMICORT) nebulizer solution 0.25 mg  0.25 mg Nebulization BID Leatha Gilding, MD   0.25 mg at 08/30/15 1947  . diltiazem (CARDIZEM SR) 12 hr capsule 120 mg  120 mg Oral Q12H Yevonne Pax, MD   120 mg at 08/30/15 2118  . folic acid (FOLVITE) tablet 1 mg  1 mg Oral Daily Yevonne Pax, MD   1 mg at 08/30/15 1048  . furosemide (LASIX) injection 20 mg  20 mg Intravenous Daily Costin Otelia Sergeant, MD   20 mg at 08/30/15 1048  . guaiFENesin (MUCINEX) 12 hr tablet 1,200 mg  1,200 mg Oral BID Yevonne Pax, MD   1,200 mg at 08/30/15 2118  . levofloxacin (LEVAQUIN) tablet 500 mg  500 mg Oral Daily Leatha Gilding, MD   500 mg at 08/30/15 1722  . LORazepam (ATIVAN) tablet 1 mg  1 mg Oral Q8H PRN Yevonne Pax, MD      . metoprolol (LOPRESSOR) tablet 50 mg  50 mg Oral Daily Yevonne Pax, MD   50 mg at  08/30/15 1050  . multivitamin with minerals tablet 1 tablet  1 tablet Oral Daily Yevonne Pax, MD   1 tablet at 08/30/15 1048  . ondansetron (ZOFRAN) tablet 4 mg  4 mg Oral Q6H PRN Yevonne Pax, MD       Or  . ondansetron [facility]) injection 4 mg  4 mg Intravenous Q6H PRN Yevonne Pax, MD      . pravastatin (PRAVACHOL) tablet 40 mg  40 mg Oral Daily Yevonne Pax, MD   40 mg at 08/30/15 1047  . predniSONE (DELTASONE) tablet 50 mg  50 mg Oral Q breakfast Leatha Gilding, MD   50 mg at 08/30/15 0851  . sodium chloride 0.9 % injection 3 mL  3 mL Intravenous Q12H Yevonne Pax, MD   3 mL at 08/29/15 1000  . sodium chloride 0.9 % injection 3 mL  3 mL Intravenous Q12H Yevonne Pax, MD   3 mL at 08/30/15 1049  . sodium chloride 0.9 % injection 3 mL  3 mL Intravenous PRN Yevonne Pax, MD      . thiamine (VITAMIN B-1) tablet 100  mg  100 mg Oral Daily Yevonne Pax, MD   100 mg at 08/30/15 1048  . traMADol (ULTRAM) tablet 50 mg  50 mg Oral Q6H PRN Yevonne Pax, MD   50 mg at 08/30/15 2126     Objective: Vital: Filed Vitals:   08/30/15 1429 08/30/15 1948 08/30/15 2100 08/31/15 0500  BP: 119/62  125/63 127/69  Pulse: 70  87 72  Temp: 98 F (36.7 C)  98 F (36.7 C) 97.7 F (36.5 C)  TempSrc: Oral     Resp: Height:      Weight:      SpO2: 95% 97% 94% 100%   I/Os: I/O last 3 completed shifts: In: 683 [P.O.:480; I.V.:3; IV Piggyback:200] Out: 1735 [Urine:1735]  Physical Exam:  General: Patient is in no apparent distress Lungs: Normal respiratory effort, chest expands symmetrically. GI: The abdomen is soft and nontender without mass. Ext: lower extremities symmetric  Lab Results:  Recent Labs  08/29/15 0530 08/31/15 0325  WBC 5.4 4.7  HGB 12.9* 13.1  HCT 40.6 41.6    Recent Labs  08/29/15 0530 08/30/15 0445 08/31/15 0325  NA 143 138 137  K 4.2 3.9 4.1  CL 99* 96* 95*  CO2 34* 33* 36*  GLUCOSE 143* 115* 107*  BUN 23* 24* 24*  CREATININE 1.07 1.07 0.97   CALCIUM 9.7 9.2 9.0   No results for input(s): LABPT, INR in the last 72 hours. No results for input(s): LABURIN in the last 72 hours. Results for orders placed or performed during the hospital encounter of 08/27/15  Blood culture (routine x 2)     Status: None (Preliminary result)   Collection Time: 08/27/15  7:10 PM  Result Value Ref Range Status   Specimen Description BLOOD LEFT HAND  Final   Special Requests BOTTLES DRAWN AEROBIC AND ANAEROBIC  Final   Culture NO GROWTH 3 DAYS  Final   Report Status PENDING  Incomplete  Blood culture (routine x 2)     Status: None (Preliminary result)   Collection Time: 08/27/15  7:31 PM  Result Value Ref Range Status   Specimen Description BLOOD RIGHT ARM  Final   Special Requests BOTTLES DRAWN AEROBIC AND ANAEROBIC 5CC  Final   Culture NO GROWTH 3 DAYS  Final   Report Status PENDING  Incomplete  MRSA PCR Screening     Status: None   Collection Time: 08/27/15 10:03 PM  Result Value Ref Range Status   MRSA by PCR NEGATIVE NEGATIVE Final    Comment:        The GeneXpert MRSA Assay (FDA approved for NASAL specimens only), is one component of a comprehensive MRSA colonization surveillance program. It is not intended to diagnose MRSA infection nor to guide or monitor treatment for MRSA infections.   Culture, sputum-assessment     Status: None   Collection Time: 08/28/15  3:07 PM  Result Value Ref Range Status   Specimen Description SPUTUM  Final   Special Requests NONE  Final   Sputum evaluation   Final    MICROSCOPIC FINDINGS SUGGEST THAT THIS SPECIMEN IS NOT REPRESENTATIVE OF LOWER RESPIRATORY SECRETIONS. PLEASE RECOLLECT. Gram Stain Report Called to,Read Back By and Verified With: J ODDONO,RN AT 1614 08/28/15 BY L BENFIELD    Report Status 08/28/2015 FINAL  Final    Studies/Results: i have independently the patient's CT scan.  There is no clear etiology of his hematuria.  Assessment:/Plan: 81 y.o. male with  gross hematuria of  unclear etiology. His CT scan is reassuring.  He will need office follow-up for cystoscopy within the next few weeks.  He would like to follow-up at [facility], which I will try and arrange.  For his severe lower urinary tract symptoms continuing flomax is a good first step.  We can re-evaluate his LUTS as an outpatient.  I will sign-off.  Please page me with any additional questions/concerns.  Berniece Salines W 08/31/2015, 8:05 AM

## 2015-09-01 DIAGNOSIS — R319 Hematuria, unspecified: Secondary | ICD-10-CM | POA: Clinically undetermined

## 2015-09-01 LAB — CULTURE, BLOOD (ROUTINE X 2)
Culture: NO GROWTH
Culture: NO GROWTH

## 2015-09-01 LAB — GLUCOSE, CAPILLARY: GLUCOSE-CAPILLARY: 103 mg/dL — AB (ref 65–99)

## 2015-09-01 MED ORDER — PREDNISONE 20 MG PO TABS: 40.0000 mg | ORAL_TABLET | Freq: Every day | ORAL | Status: DC

## 2015-09-01 MED ORDER — LEVOFLOXACIN 500 MG PO TABS: 500.0000 mg | ORAL_TABLET | Freq: Every day | ORAL | Status: DC

## 2015-09-01 MED ORDER — ASPIRIN EC 81 MG PO TBEC: 81.0000 mg | DELAYED_RELEASE_TABLET | Freq: Every day | ORAL | Status: DC

## 2015-09-01 NOTE — Discharge Summary (Addendum)
Physician Discharge Summary  Logan West ZOX:[id number] DOB: [birth year] DOA: 08/27/2015  PCP: Mechele Claude, MD  Admit date: 08/27/2015 Discharge date: 09/01/2015  Time spent: > 35 minutes  Recommendations for Outpatient Follow-up:  1. Follow up with Dr. Mechele Claude as scheduled in 7 days 2. Follow up with Dr. Antoine Poche in 8 days as scheduled 3. Urology will arrange outpatient follow up in [place]  Discharge Diagnoses:  Active Problems:   Essential hypertension   HLD (hyperlipidemia)   Acute on chronic respiratory failure with hypoxemia (HCC)   COPD exacerbation (HCC)   HCAP (healthcare-associated pneumonia)   Acute on chronic combined systolic and diastolic heart failure (HCC)   Atrial fibrillation (HCC)   Hematuria  Discharge Condition: stable  Diet recommendation: heart healthy  Filed Weights   08/29/15 0500 08/30/15 0500 09/01/15 0503  Weight: 110.1 kg (242 lb 11.6 oz) 112.537 kg (248 lb 1.6 oz) 112.22 kg (247 lb 6.4 oz)   History of present illness:  Logan West is a 81 y.o. male with prior history of COPD HTN HLD presents with increased shortness of breath. Patient has noted increased shortness of breath for a couple weeks. He states that he was recently in the hospital for same complaints at [facility]. Patient states that he has not been able to walk even on level grounds. He states he also has a history of CHF and atrial fibrillation. Patient has had edema also. He states he has no cough noted aside from his baseline. He states has no chest pain noted.  Hospital Course:  Acute on chronic Respiratory failure with hypoxia - improving, this is multifactorial due to COPD exacerbation / HCAP, oxygen as needed, he is on chronic O2 at home. He was initially treated with broad spectrum antibiotics with Vancomycin and Zosyn and with clinical improvement after 3 days of IV antibiotics he was narrowed to levofloxacin and will complete a total of 7 day course as an  outpatient.  Acute on chronic combined systolic and diastolic CHF - received fluids on admission, LE edema improving with IV Lasix, his LE swelling improved, he was transitioned back to his oral regimen which he is to continue on discharge.  Hematuria - patient with worsening hematuria 10/8, passing blood clots and dark bloody urine. Patient is on Eliquid for A fib which was just started during his previous hospitalization earlier this month at [facility]. Urology was consulted and have followed patient while hospitalized, patient underwent a CT scan without clear etiology for his hematuria. His Eliquis was stopped, this was discussed with patient's cardiologist as well. Risks/benefits were discussed with patient, given significant urinary bleeding Eliquis will be temporarily discontinued on discharge. Urology will arrange outpatient follow up for probable cystoscopy, and will likely need to resume anticoagulation at one point.  Atrial Fibrillation - rate controlled with cardizem and lopressor, discontinue Eliquis as above. Discussed with cardiology. HCAP - afebrile, transition to Levaquin, obtain cultures sputum and blood, NGTD, strep pneumo negative, legionella negative COPD with exacerbation - improved with antibiotics, steroids, he will be discharged on a steroid taper as well.  HTN - blood pressure controlled, continue home medications HLD - will continue with statins  Procedures:  None    Consultations:  Urology   Discharge Exam: Filed Vitals:   09/01/15 0503 09/01/15 0853 09/01/15 1051 09/01/15 1052  BP: 122/59  122/59 122/59  Pulse: 82 82 86   Temp: 97.4 F (36.3 C)     TempSrc: Oral     Resp: 20  18    Height:      Weight: 112.22 kg (247 lb 6.4 oz)     SpO2: 99% 95%     General: NAD Cardiovascular: RRR Respiratory: CTA biL  Discharge Instructions    Medication List    STOP taking these medications        apixaban 5 MG Tabs tablet  Commonly known as:  ELIQUIS        TAKE these medications        albuterol 108 (90 BASE) MCG/ACT inhaler  Commonly known as:  PROVENTIL HFA;VENTOLIN HFA  Inhale 2 puffs into the lungs every 6 (six) hours as needed for wheezing or shortness of breath.     albuterol (2.5 MG/3ML) 0.083% nebulizer solution  Commonly known as:  PROVENTIL  Take 3 mLs (2.5 mg total) by nebulization every 6 (six) hours as needed for wheezing or shortness of breath.     amLODipine 10 MG tablet  Commonly known as:  NORVASC  TAKE 1 TABLET (10 MG TOTAL) BY MOUTH DAILY.     budesonide-formoterol 160-4.5 MCG/ACT inhaler  Commonly known as:  SYMBICORT  Inhale 2 puffs into the lungs 2 (two) times daily.     diltiazem 120 MG 12 hr capsule  Commonly known as:  CARDIZEM SR  Take 1 capsule (120 mg total) by mouth every 12 (twelve) hours.     furosemide 20 MG tablet  Commonly known as:  LASIX  TAKE 1 TABLET (20 MG TOTAL) BY MOUTH DAILY.     guaiFENesin 600 MG 12 hr tablet  Commonly known as:  MUCINEX  Take 2 tablets (1,200 mg total) by mouth 2 (two) times daily.     levofloxacin 500 MG tablet  Commonly known as:  LEVAQUIN  Take 1 tablet (500 mg total) by mouth daily.     LORazepam 1 MG tablet  Commonly known as:  ATIVAN  Take 1 tablet (1 mg total) by mouth every 8 (eight) hours as needed for anxiety.     metoprolol 50 MG tablet  Commonly known as:  LOPRESSOR  TAKE 1 TABLET (50 MG TOTAL) BY MOUTH DAILY.     pravastatin 40 MG tablet  Commonly known as:  PRAVACHOL  TAKE 1 TABLET (40 MG TOTAL) BY MOUTH DAILY.     predniSONE 20 MG tablet  Commonly known as:  DELTASONE  Take 2 tablets (40 mg total) by mouth daily with breakfast. 2 tablets daily for 3 days then 1 tablet for 3 days then half a tablet for 4 days     traMADol 50 MG tablet  Commonly known as:  ULTRAM  TAKE 1 TABLET BY MOUTH EVERY eight HOURS AS NEEDED FOR MODERATE PAIN       Follow-up Information    Follow up with STACKS,WARREN, MD. Go in 1 week.   Specialty:  Family  Medicine   Why:  @ 10:55 AM with Dr.Stacks   Contact information:   [address] [place] [address] [phone]       Follow up with Rollene Rotunda, MD In 7 days.   Specialty:  Cardiology   Why:  as scheduled   Contact information:   [address] [place] [address] [phone]       Follow up with [facility] [place]. Go on 09/09/2015.   Why:  @ 10 AM Raj Janus office   Contact information:   [address] [place] [address] [phone]  The results of significant diagnostics from this hospitalization (including imaging, microbiology, ancillary and laboratory) are listed below for reference.    Significant Diagnostic Studies: Ct Abdomen Pelvis W Wo Contrast  08/30/2015   CLINICAL DATA:  81 year old male with chronic abdominal pain. Obesity. Hyperlipidemia. Hematuria.  EXAM: CT ABDOMEN AND PELVIS WITHOUT AND WITH CONTRAST  TECHNIQUE: Multidetector CT imaging of the abdomen and pelvis was performed following the standard protocol before and following the bolus administration of intravenous contrast.  CONTRAST:  OMNIPAQUE IOHEXOL 300 MG/ML  SOLN  COMPARISON:  CT of the abdomen and pelvis 08/03/2015.  FINDINGS: Lower chest: Areas of dependent subsegmental atelectasis and/or scarring in the lower lobes of the lungs bilaterally (left). Small left pleural effusion and trace right-sided pleural effusion both layering dependently, new compared to prior study.  Hepatobiliary: A few scattered calcified granulomas are noted in the liver. No suspicious cystic or solid hepatic lesions. Mobile high attenuation material lies dependently in the gallbladder, compatible with biliary sludge and/or tiny gallstones. No findings to suggest an acute cholecystitis at this time.  Pancreas: No pancreatic mass. No pancreatic ductal dilatation. No pancreatic or peripancreatic fluid or inflammatory changes.  Spleen: Normal appearance of  the spleen.  Adrenals/Urinary Tract: No calcifications are identified within the collecting system of either kidney, along the course of either ureter, or within the lumen of the urinary bladder. No suspicious cystic or solid renal lesions are identified on post-contrast images. No hydroureteronephrosis. Postcontrast delayed images demonstrate no definite filling defects within the collecting system of either kidney, along the well opacified portions of either ureter (much of both ureters are incompletely opacified), or within the lumen of the urinary bladder to strongly suggest presence of a urothelial neoplasm at this time. Small bladder wall diverticulum noted on the right side. Urinary bladder is otherwise normal in appearance. Bilateral adrenal glands are normal in appearance.  Stomach/Bowel: Normal appearance of the stomach. No pathologic dilatation of small bowel or colon. Normal appendix.  Vascular/Lymphatic: Extensive atherosclerosis throughout the abdominal and pelvic vasculature, without evidence of aneurysm or dissection. Retroaortic left renal vein. No lymphadenopathy noted in the abdomen or pelvis.  Reproductive: Prostate gland and seminal vesicles are unremarkable in appearance.  Other: Tiny umbilical hernia containing only omental fat. No findings to suggest associated bowel incarceration or obstruction at this time. No significant volume of ascites. No pneumoperitoneum.  Musculoskeletal: There are no aggressive appearing lytic or blastic lesions noted in the visualized portions of the skeleton.  IMPRESSION: 1. No explanation for the patient's history of hematuria. 2. No acute findings in the abdomen or pelvis to account for the patient's history of abdominal pain. 3. Biliary sludge and/or tiny gallstones lying dependently in the gallbladder. No current findings to suggest an acute cholecystitis at this time. 4. Interval development of small left and trace right-sided pleural effusions with worsening  areas of atelectasis and/or scarring in the lung bases bilaterally (left greater than right). 5. Normal appendix. 6. Extensive atherosclerosis. 7. Tiny umbilical hernia containing only omental fat.   Electronically Signed   By: Trudie Reed M.D.   On: 08/30/2015 16:19   Ct Chest Wo Contrast  08/27/2015   CLINICAL DATA:  Shortness of breath 2 weeks.  Coughing.  Chest pain.  EXAM: CT CHEST WITHOUT CONTRAST  TECHNIQUE: Multidetector CT imaging of the chest was performed following the standard protocol without IV contrast.  COMPARISON:  None.  FINDINGS: The central airways are patent. There is a small left pleural effusion. There  is left lower lobe airspace disease. There is lingular atelectasis and mild scarring. There a trace right pleural effusion. There are patchy areas of ground-glass opacity in the right upper lobe. There is mild reticular nodular interstitial disease at the right lung base. There is no pneumothorax.  There are no pathologically enlarged axillary, hilar or mediastinal lymph nodes.  The heart size is normal. There is no pericardial effusion. The thoracic aorta is normal in caliber. There is thoracic aortic atherosclerosis.  Review of bone windows demonstrates no focal lytic or sclerotic lesions.  Limited non-contrast images of the upper abdomen were obtained. There is high attenuation material dependently within the gallbladder which may represent cholelithiasis versus gallbladder sludge. The remainder of the visualized abdominal organs are unremarkable.  IMPRESSION: 1. Left lower lobe pneumonia with a small parapneumonic effusion. 2. Mild reticular nodular interstitial disease at the right lung base likely secondary to an infectious etiology. Trace right pleural effusion.   Electronically Signed   By: Elige Ko   On: 08/27/2015 18:05   Ct Abdomen Pelvis W Contrast  08/03/2015   CLINICAL DATA:  Abdominal pain since 08/01/2015. Abnormal plain film.  EXAM: CT ABDOMEN AND PELVIS WITH  CONTRAST  TECHNIQUE: Multidetector CT imaging of the abdomen and pelvis was performed using the standard protocol following bolus administration of intravenous contrast.  CONTRAST:  OMNIPAQUE IOHEXOL 300 MG/ML  SOLN  COMPARISON:  Plain films 08/02/2015  FINDINGS: Linear areas of atelectasis in the lung bases. No effusions. Heart is normal size.  Mild fatty infiltration of the liver. High-density material layers within the gallbladder, likely a small stones. Spleen, pancreas, adrenals and kidneys are normal. Aorta and iliac vessels are calcified, non aneurysmal. Urinary bladder is unremarkable.  Appendix is visualized and is normal. Stomach, large and small bowel unremarkable. No free fluid, free air or adenopathy.  No mass in the left abdomen as suggested by soft tissue density on prior plain films.  No acute bony abnormality or focal bone lesion.  IMPRESSION: No acute findings in the abdomen or pelvis.  Mild fatty infiltration of the liver.  Probable layering small stones in the gallbladder.  Areas of atelectasis in the lung bases bilaterally.   Electronically Signed   By: Charlett Nose M.D.   On: 08/03/2015 15:09   Dg Chest Port 1 View  08/27/2015   CLINICAL DATA:  Increased shortness of Breath  EXAM: PORTABLE CHEST - 1 VIEW  COMPARISON:  08/01/2015  FINDINGS: Cardiac shadow is at the upper limits of normal in size but stable from the prior study. Increasing infiltrative changes are noted in the bases bilaterally particularly in the left lung base when compare with the prior study. No sizable effusion is seen. No bony abnormality is noted.  IMPRESSION: Increasing bibasilar infiltrates left greater than right. Followup PA and lateral chest X-ray is recommended in 3-4 weeks following trial of antibiotic therapy to ensure resolution and exclude underlying malignancy.   Electronically Signed   By: Alcide Clever M.D.   On: 08/27/2015 14:36   Microbiology: Recent Results (from the past 240 hour(s))  Blood  culture (routine x 2)     Status: None   Collection Time: 08/27/15  7:10 PM  Result Value Ref Range Status   Specimen Description BLOOD LEFT HAND  Final   Special Requests BOTTLES DRAWN AEROBIC AND ANAEROBIC  Final   Culture NO GROWTH 5 DAYS  Final   Report Status 09/01/2015 FINAL  Final  Blood culture (routine x  2)     Status: None   Collection Time: 08/27/15  7:31 PM  Result Value Ref Range Status   Specimen Description BLOOD RIGHT ARM  Final   Special Requests BOTTLES DRAWN AEROBIC AND ANAEROBIC 5CC  Final   Culture NO GROWTH 5 DAYS  Final   Report Status 09/01/2015 FINAL  Final  MRSA PCR Screening     Status: None   Collection Time: 08/27/15 10:03 PM  Result Value Ref Range Status   MRSA by PCR NEGATIVE NEGATIVE Final    Comment:        The GeneXpert MRSA Assay (FDA approved for NASAL specimens only), is one component of a comprehensive MRSA colonization surveillance program. It is not intended to diagnose MRSA infection nor to guide or monitor treatment for MRSA infections.   Culture, sputum-assessment     Status: None   Collection Time: 08/28/15  3:07 PM  Result Value Ref Range Status   Specimen Description SPUTUM  Final   Special Requests NONE  Final   Sputum evaluation   Final    MICROSCOPIC FINDINGS SUGGEST THAT THIS SPECIMEN IS NOT REPRESENTATIVE OF LOWER RESPIRATORY SECRETIONS. PLEASE RECOLLECT. Gram Stain Report Called to,Read Back By and Verified With: J ODDONO,RN AT 1614 08/28/15 BY L BENFIELD    Report Status 08/28/2015 FINAL  Final   Labs: Basic Metabolic Panel:  Recent Labs Lab 08/27/15 1400 08/27/15 2301 08/28/15 0350 08/29/15 0530 08/30/15 0445 08/31/15 0325  NA 143  --  142 143 138 137  K 3.9  --  4.1 4.2 3.9 4.1  CL 101  --  99* 99* 96* 95*  CO2 32  --  33* 34* 33* 36*  GLUCOSE 125*  --  144* 143* 115* 107*  BUN 18  --  16 23* 24* 24*  CREATININE 1.13 1.25* 1.15 1.07 1.07 0.97  CALCIUM 9.9  --  9.4 9.7 9.2 9.0   Liver Function  Tests:  Recent Labs Lab 08/28/15 0350  AST 18  ALT 18  ALKPHOS 90  BILITOT 1.2  PROT 6.3*  ALBUMIN 2.9*   CBC:  Recent Labs Lab 08/27/15 1400 08/27/15 2301 08/28/15 0350 08/29/15 0530 08/31/15 0325  WBC 4.8 4.5 4.6 5.4 4.7  HGB 14.3 13.8 13.7 12.9* 13.1  HCT 45.8 43.3 43.0 40.6 41.6  MCV 95.0 94.3 94.7 93.8 93.7  PLT 206 206 200 208 213   Cardiac Enzymes:  Recent Labs Lab 08/27/15 2301 08/28/15 0350 08/28/15 1000  TROPONINI <0.03 <0.03 <0.03   BNP: BNP (last 3 results)  Recent Labs  08/01/15 1451 08/17/15 1210 08/27/15 2301  BNP 75.0 103.3* 160.6*   CBG:  Recent Labs Lab 08/28/15 0730 08/29/15 0724 08/30/15 0735 08/31/15 0732 09/01/15 0737  GLUCAP 162* 139* 115* 107* 103*    Signed:  Ethelwyn Gilbertson  [facility] 09/01/2015, 4:13 PM

## 2015-09-02 DIAGNOSIS — J449 Chronic obstructive pulmonary disease, unspecified: Secondary | ICD-10-CM | POA: Diagnosis not present

## 2015-09-08 ENCOUNTER — Ambulatory Visit (INDEPENDENT_AMBULATORY_CARE_PROVIDER_SITE_OTHER): Payer: Medicare Other | Admitting: Family Medicine

## 2015-09-08 ENCOUNTER — Ambulatory Visit (INDEPENDENT_AMBULATORY_CARE_PROVIDER_SITE_OTHER): Payer: Medicare Other

## 2015-09-08 VITALS — Temp 97.4°F | Ht 68.0 in | Wt 243.0 lb

## 2015-09-08 DIAGNOSIS — E785 Hyperlipidemia, unspecified: Secondary | ICD-10-CM

## 2015-09-08 DIAGNOSIS — I10 Essential (primary) hypertension: Secondary | ICD-10-CM

## 2015-09-08 DIAGNOSIS — R2689 Other abnormalities of gait and mobility: Secondary | ICD-10-CM

## 2015-09-08 DIAGNOSIS — J431 Panlobular emphysema: Secondary | ICD-10-CM

## 2015-09-08 DIAGNOSIS — Z9981 Dependence on supplemental oxygen: Secondary | ICD-10-CM

## 2015-09-08 DIAGNOSIS — R29818 Other symptoms and signs involving the nervous system: Secondary | ICD-10-CM

## 2015-09-08 NOTE — Progress Notes (Signed)
Subjective:  Patient ID: Logan West, male    DOB: [birth year]  Age: 81 y.o. MRN: [id number]  CC: Hospitalization Follow-up   HPI Logan West presents for recheck after a pneumonia. Admitted first to [facility] then transferred to [facility]. DCed on 10/11. Oxygen dependent. Wearing it at eval today. Has residual cough and dyspnea with exertion. Still weak. No chest pain. No edema. Cough is residual and nonproductive.  Finished antibioitcs. Has a couple of days of 1/2 prednisone tab daily. Pt. Using wheelchair for now. Feeling unsteady on his feet. Not dizzy, but slightly off balance. Denies that it is severe enough to need rehab. Feels it is improving, just wants to speed up progress.   History Logan West has a past medical history of Hypertension; Hyperlipidemia; COPD (chronic obstructive pulmonary disease) ([place]); Obesity; and Chronic pain.   Logan West has past surgical history that includes Percutaneous pinning and Fracture surgery.   His family history includes Asthma in his father; Cancer in his mother; Coronary artery disease in his other; Heart attack in his brother.Logan West reports that Logan West quit smoking about 24 years ago. His smoking use included Cigarettes. Logan West has a 62.5 pack-year smoking history. Logan West does not have any smokeless tobacco history on file. His alcohol and drug histories are not on file.  Outpatient Prescriptions Prior to Visit  Medication Sig Dispense Refill  . albuterol (PROVENTIL HFA;VENTOLIN HFA) 108 (90 BASE) MCG/ACT inhaler Inhale 2 puffs into the lungs every 6 (six) hours as needed for wheezing or shortness of breath.    Marland Kitchen albuterol (PROVENTIL) (2.5 MG/3ML) 0.083% nebulizer solution Take 3 mLs (2.5 mg total) by nebulization every 6 (six) hours as needed for wheezing or shortness of breath. 75 mL 12  . amLODipine (NORVASC) 10 MG tablet TAKE 1 TABLET (10 MG TOTAL) BY MOUTH DAILY.  5  . budesonide-formoterol (SYMBICORT) 160-4.5 MCG/ACT inhaler Inhale 2 puffs into the lungs 2 (two) times  daily. 1 Inhaler 5  . diltiazem (CARDIZEM SR) 120 MG 12 hr capsule Take 1 capsule (120 mg total) by mouth every 12 (twelve) hours. 60 capsule 1  . furosemide (LASIX) 20 MG tablet TAKE 1 TABLET (20 MG TOTAL) BY MOUTH DAILY. 30 tablet 5  . guaiFENesin (MUCINEX) 600 MG 12 hr tablet Take 2 tablets (1,200 mg total) by mouth 2 (two) times daily. 20 tablet 1  . levofloxacin (LEVAQUIN) 500 MG tablet Take 1 tablet (500 mg total) by mouth daily. 3 tablet 0  . LORazepam (ATIVAN) 1 MG tablet Take 1 tablet (1 mg total) by mouth every 8 (eight) hours as needed for anxiety. 60 tablet 5  . metoprolol (LOPRESSOR) 50 MG tablet TAKE 1 TABLET (50 MG TOTAL) BY MOUTH DAILY. 30 tablet 5  . pravastatin (PRAVACHOL) 40 MG tablet TAKE 1 TABLET (40 MG TOTAL) BY MOUTH DAILY. 30 tablet 2  . predniSONE (DELTASONE) 20 MG tablet Take 2 tablets (40 mg total) by mouth daily with breakfast. 2 tablets daily for 3 days then 1 tablet for 3 days then half a tablet for 4 days 11 tablet 0  . traMADol (ULTRAM) 50 MG tablet TAKE 1 TABLET BY MOUTH EVERY eight HOURS AS NEEDED FOR MODERATE PAIN 90 tablet 5   No facility-administered medications prior to visit.    ROS Review of Systems  Constitutional: Negative for fever, chills and diaphoresis.  HENT: Positive for congestion and postnasal drip. Negative for rhinorrhea and sore throat.   Respiratory: Positive for cough, shortness of breath and wheezing. Negative for apnea and  chest tightness.   Cardiovascular: Negative for chest pain.  Gastrointestinal: Negative for nausea, vomiting, abdominal pain, diarrhea, constipation and abdominal distention.  Genitourinary: Negative for dysuria and frequency.  Musculoskeletal: Negative for joint swelling and arthralgias.  Skin: Negative for rash.  Neurological: Positive for weakness (nonfocal) and light-headedness. Negative for headaches.  Psychiatric/Behavioral: Negative for sleep disturbance. The patient is not nervous/anxious.     Objective:    Temp(Src) 97.4 F (36.3 C) (Oral)  Ht 5' 8"  (1.727 m)  Wt 243 lb (110.224 kg)  BMI 36.96 kg/m2  BP Readings from Last 3 Encounters:  09/01/15 122/59  08/17/15 117/65  08/04/15 117/65    Wt Readings from Last 3 Encounters:  09/08/15 243 lb (110.224 kg)  09/01/15 247 lb 6.4 oz (112.22 kg)  08/17/15 245 lb 3.2 oz (111.222 kg)     Physical Exam  Constitutional: Logan West is oriented to person, place, and time. Logan West appears well-developed and well-nourished. No distress.  HENT:  Head: Normocephalic and atraumatic.  Right Ear: External ear normal.  Left Ear: External ear normal.  Nose: Nose normal.  Mouth/Throat: Oropharynx is clear and moist.  Eyes: Conjunctivae and EOM are normal. Pupils are equal, round, and reactive to light.  Neck: Normal range of motion. Neck supple. No thyromegaly present.  Cardiovascular: Normal rate, regular rhythm and normal heart sounds.   No murmur heard. Pulmonary/Chest: Effort normal. No respiratory distress. Logan West has wheezes. Logan West has no rales.  Abdominal: Soft. Bowel sounds are normal. Logan West exhibits no distension. There is no tenderness.  Musculoskeletal:  Symmetrically weakened   Lymphadenopathy:    Logan West has no cervical adenopathy.  Neurological: Logan West is alert and oriented to person, place, and time. Logan West has normal reflexes. Logan West displays normal reflexes. Logan West exhibits abnormal muscle tone (decreased). Coordination abnormal.  Skin: Skin is warm and dry.  Psychiatric: Logan West has a normal mood and affect. His behavior is normal. Judgment and thought content normal.    Lab Results  Component Value Date   HGBA1C 6.0* 08/27/2015    Lab Results  Component Value Date   WBC 4.7 08/31/2015   HGB 13.1 08/31/2015   HCT 41.6 08/31/2015   PLT 213 08/31/2015   GLUCOSE 107* 08/31/2015   CHOL 135 03/20/2015   TRIG 88 03/20/2015   HDL 47 03/20/2015   LDLCALC 70 03/20/2015   ALT 18 08/28/2015   AST 18 08/28/2015   NA 137 08/31/2015   K 4.1 08/31/2015   CL 95* 08/31/2015    CREATININE 0.97 08/31/2015   BUN 24* 08/31/2015   CO2 36* 08/31/2015   TSH 1.027 08/27/2015   PSA 1.9 03/31/2014   INR 1.38 08/27/2015   HGBA1C 6.0* 08/27/2015    Ct Chest Wo Contrast  08/27/2015  CLINICAL DATA:  Shortness of breath 2 weeks.  Coughing.  Chest pain. EXAM: CT CHEST WITHOUT CONTRAST TECHNIQUE: Multidetector CT imaging of the chest was performed following the standard protocol without IV contrast. COMPARISON:  None. FINDINGS: The central airways are patent. There is a small left pleural effusion. There is left lower lobe airspace disease. There is lingular atelectasis and mild scarring. There a trace right pleural effusion. There are patchy areas of ground-glass opacity in the right upper lobe. There is mild reticular nodular interstitial disease at the right lung base. There is no pneumothorax. There are no pathologically enlarged axillary, hilar or mediastinal lymph nodes. The heart size is normal. There is no pericardial effusion. The thoracic aorta is normal in caliber. There is thoracic  aortic atherosclerosis. Review of bone windows demonstrates no focal lytic or sclerotic lesions. Limited non-contrast images of the upper abdomen were obtained. There is high attenuation material dependently within the gallbladder which may represent cholelithiasis versus gallbladder sludge. The remainder of the visualized abdominal organs are unremarkable. IMPRESSION: 1. Left lower lobe pneumonia with a small parapneumonic effusion. 2. Mild reticular nodular interstitial disease at the right lung base likely secondary to an infectious etiology. Trace right pleural effusion. Electronically Signed   By: Kathreen Devoid   On: 08/27/2015 18:05   Dg Chest Port 1 View  08/27/2015  CLINICAL DATA:  Increased shortness of Breath EXAM: PORTABLE CHEST - 1 VIEW COMPARISON:  08/01/2015 FINDINGS: Cardiac shadow is at the upper limits of normal in size but stable from the prior study. Increasing infiltrative changes  are noted in the bases bilaterally particularly in the left lung base when compare with the prior study. No sizable effusion is seen. No bony abnormality is noted. IMPRESSION: Increasing bibasilar infiltrates left greater than right. Followup PA and lateral chest X-ray is recommended in 3-4 weeks following trial of antibiotic therapy to ensure resolution and exclude underlying malignancy. Electronically Signed   By: Inez Catalina M.D.   On: 08/27/2015 14:36    Assessment & Plan:   Logan West was seen today for hospitalization follow-up.  Diagnoses and all orders for this visit:  Essential hypertension, benign -     CMP14+EGFR  Hyperlipemia -     CMP14+EGFR -     Lipid panel  Oxygen dependent -     CMP14+EGFR  Panlobular emphysema (HCC) -     DG Chest 2 View; Future -     CBC with Differential/Platelet -     CMP14+EGFR -     Lipid panel -     Cane adjustable wide base quad  Balance disorder -     CMP14+EGFR -     Cane adjustable wide base quad   I am having Logan West maintain his budesonide-formoterol, furosemide, metoprolol, albuterol, albuterol, diltiazem, guaiFENesin, amLODipine, traMADol, LORazepam, pravastatin, levofloxacin, predniSONE, and aspirin.  Meds ordered this encounter  Medications  . aspirin 81 MG EC tablet    Sig: Take 81 mg by mouth daily.    Refill:  0     Follow-up: Return in about 2 weeks (around 09/22/2015).  Claretta Fraise, M.D.

## 2015-09-09 ENCOUNTER — Ambulatory Visit (INDEPENDENT_AMBULATORY_CARE_PROVIDER_SITE_OTHER): Payer: Medicare Other | Admitting: Cardiology

## 2015-09-09 ENCOUNTER — Encounter: Payer: Self-pay | Admitting: Cardiology

## 2015-09-09 ENCOUNTER — Ambulatory Visit (INDEPENDENT_AMBULATORY_CARE_PROVIDER_SITE_OTHER): Payer: Medicare Other | Admitting: Urology

## 2015-09-09 VITALS — BP 114/73 | HR 116 | Ht 68.0 in | Wt 239.0 lb

## 2015-09-09 DIAGNOSIS — R351 Nocturia: Secondary | ICD-10-CM | POA: Diagnosis not present

## 2015-09-09 DIAGNOSIS — N3941 Urge incontinence: Secondary | ICD-10-CM | POA: Diagnosis not present

## 2015-09-09 DIAGNOSIS — N401 Enlarged prostate with lower urinary tract symptoms: Secondary | ICD-10-CM | POA: Diagnosis not present

## 2015-09-09 DIAGNOSIS — I4891 Unspecified atrial fibrillation: Secondary | ICD-10-CM | POA: Diagnosis not present

## 2015-09-09 LAB — CMP14+EGFR
ALT: 29 IU/L (ref 0–44)
AST: 21 IU/L (ref 0–40)
Albumin/Globulin Ratio: 1.6 (ref 1.1–2.5)
Albumin: 3.8 g/dL (ref 3.5–4.8)
Alkaline Phosphatase: 80 IU/L (ref 39–117)
BUN / CREAT RATIO: 14 (ref 10–22)
BUN: 14 mg/dL (ref 8–27)
Bilirubin Total: 1 mg/dL (ref 0.0–1.2)
CHLORIDE: 94 mmol/L — AB (ref 97–106)
CO2: 33 mmol/L — AB (ref 18–29)
CREATININE: 1 mg/dL (ref 0.76–1.27)
Calcium: 9.7 mg/dL (ref 8.6–10.2)
GFR calc non Af Amer: 71 mL/min/{1.73_m2} (ref >59)
GFR, EST AFRICAN AMERICAN: 82 mL/min/{1.73_m2} (ref >59)
GLUCOSE: 95 mg/dL (ref 65–99)
Globulin, Total: 2.4 g/dL (ref 1.5–4.5)
Potassium: 4.5 mmol/L (ref 3.5–5.2)
Sodium: 141 mmol/L (ref 136–144)
TOTAL PROTEIN: 6.2 g/dL (ref 6.0–8.5)

## 2015-09-09 LAB — CBC WITH DIFFERENTIAL/PLATELET
BASOS ABS: 0 10*3/uL (ref 0.0–0.2)
Basos: 0 %
EOS (ABSOLUTE): 0 10*3/uL (ref 0.0–0.4)
Eos: 0 %
HEMOGLOBIN: 15.4 g/dL (ref 12.6–17.7)
Hematocrit: 46.7 % (ref 37.5–51.0)
IMMATURE GRANS (ABS): 0.1 10*3/uL (ref 0.0–0.1)
IMMATURE GRANULOCYTES: 1 %
LYMPHS: 7 %
Lymphocytes Absolute: 0.7 10*3/uL (ref 0.7–3.1)
MCH: 30.1 pg (ref 26.6–33.0)
MCHC: 33 g/dL (ref 31.5–35.7)
MCV: 91 fL (ref 79–97)
MONOCYTES: 8 %
Monocytes Absolute: 0.8 10*3/uL (ref 0.1–0.9)
NEUTROS ABS: 8.5 10*3/uL — AB (ref 1.4–7.0)
NEUTROS PCT: 84 %
PLATELETS: 168 10*3/uL (ref 150–379)
RBC: 5.11 x10E6/uL (ref 4.14–5.80)
RDW: 15 % (ref 12.3–15.4)
WBC: 10.2 10*3/uL (ref 3.4–10.8)

## 2015-09-09 LAB — LIPID PANEL
Chol/HDL Ratio: 2.2 ratio units (ref 0.0–5.0)
Cholesterol, Total: 178 mg/dL (ref 100–199)
HDL: 81 mg/dL (ref >39)
LDL CALC: 78 mg/dL (ref 0–99)
Triglycerides: 93 mg/dL (ref 0–149)
VLDL CHOLESTEROL CAL: 19 mg/dL (ref 5–40)

## 2015-09-09 MED ORDER — DILTIAZEM HCL ER COATED BEADS 360 MG PO CP24: 360.0000 mg | ORAL_CAPSULE | Freq: Every day | ORAL | Status: DC

## 2015-09-09 NOTE — Patient Instructions (Signed)
Medication Instructions:  Please STOP AMLODIPINE. Increase Diltiazem to 360 mg a day. Continue all other medications as listed.  Follow-Up: [place] 2 weeks [place]  Thank you for choosing [facility]!!

## 2015-09-09 NOTE — Progress Notes (Signed)
Cardiology Office Note   Date:  09/09/2015   ID:  Logan West, DOB Apr 26, 1934, MRN [id number]  PCP:  Mechele ClaudeSTACKS,WARREN, MD  Cardiologist:   Rollene RotundaJames Margaux Engen, MD   Chief Complaint  Patient presents with  . Atrial Fibrillation      History of Present Illness: Logan West is a 81 y.o. male who presents for evaluation of atrial fibrillation. I saw him about 4 years ago for some shortness of breath. He did not have significant cardiac history. However, this year in September he had atrial fibrillation with a rapid rate.  He was treated with anticoagulation. He was noted to have a lower ejection fraction of 45-50% and left and right heart catheterization was suggested when he had recovered. However, in October he had hematuria. He was admitted to the hospital and his Eliquis was stopped. I have reviewed multiple hospital records extensively. He was to have urology follow-up and then consideration of resuming the blood thinners. He presents here now for follow-up. He's on chronic O2 for chronic lung disease. He's not ever felt fibrillation. He doesn't have any palpitations, presyncope or syncope. He's had no further hematuria. He's not describing PND or orthopnea. He's had no weight gain or edema. He is chronically ill. However, he gets along at home.  Past Medical History  Diagnosis Date  . Hypertension     x30 years  . Hyperlipidemia     x 7-8  . COPD (chronic obstructive pulmonary disease) (HCC)   . Obesity   . Chronic pain     Past Surgical History  Procedure Laterality Date  . Percutaneous pinning      right forearm fracture  . Fracture surgery      right forearm     Current Outpatient Prescriptions  Medication Sig Dispense Refill  . albuterol (PROVENTIL HFA;VENTOLIN HFA) 108 (90 BASE) MCG/ACT inhaler Inhale 2 puffs into the lungs every 6 (six) hours as needed for wheezing or shortness of breath.    Marland Kitchen. albuterol (PROVENTIL) (2.5 MG/3ML) 0.083% nebulizer solution Take 3 mLs (2.5  mg total) by nebulization every 6 (six) hours as needed for wheezing or shortness of breath. 75 mL 12  . aspirin 81 MG EC tablet Take 81 mg by mouth daily.  0  . budesonide-formoterol (SYMBICORT) 160-4.5 MCG/ACT inhaler Inhale 2 puffs into the lungs 2 (two) times daily. 1 Inhaler 5  . furosemide (LASIX) 20 MG tablet TAKE 1 TABLET (20 MG TOTAL) BY MOUTH DAILY. 30 tablet 5  . guaiFENesin (MUCINEX) 600 MG 12 hr tablet Take 2 tablets (1,200 mg total) by mouth 2 (two) times daily. 20 tablet 1  . LORazepam (ATIVAN) 1 MG tablet Take 1 tablet (1 mg total) by mouth every 8 (eight) hours as needed for anxiety. 60 tablet 5  . metoprolol (LOPRESSOR) 50 MG tablet TAKE 1 TABLET (50 MG TOTAL) BY MOUTH DAILY. 30 tablet 5  . pravastatin (PRAVACHOL) 40 MG tablet TAKE 1 TABLET (40 MG TOTAL) BY MOUTH DAILY. 30 tablet 2  . predniSONE (DELTASONE) 20 MG tablet Take 2 tablets (40 mg total) by mouth daily with breakfast. 2 tablets daily for 3 days then 1 tablet for 3 days then half a tablet for 4 days 11 tablet 0  . traMADol (ULTRAM) 50 MG tablet TAKE 1 TABLET BY MOUTH EVERY eight HOURS AS NEEDED FOR MODERATE PAIN 90 tablet 5  . diltiazem (CARDIZEM CD) 360 MG 24 hr capsule Take 1 capsule (360 mg total) by mouth daily. 30 capsule 6  No current facility-administered medications for this visit.    Allergies:   Review of patient's allergies indicates no known allergies.    ROS:  Please see the history of present illness.   Otherwise, review of systems are positive for none.   All other systems are reviewed and negative.    PHYSICAL EXAM: VS:  BP 114/73 mmHg  Pulse 116  Ht  (1.727 m)  Wt 239 lb (108.41 kg)  BMI 36.35 kg/m2  SpO2 93% , BMI Body mass index is 36.35 kg/(m^2). GENERAL:  Well appearing HEENT:  Pupils equal round and reactive, fundi not visualized, oral mucosa unremarkable NECK:  No jugular venous distention, waveform within normal limits, carotid upstroke brisk and symmetric, no bruits, no  thyromegaly LYMPHATICS:  No cervical, inguinal adenopathy LUNGS:  Clear to auscultation bilaterally BACK:  No CVA tenderness CHEST:  Unremarkable HEART:  PMI not displaced or sustained,S1 and S2 within normal limits, no S3,  no clicks, no rubs, no murmurs, irregular ABD:  Flat, positive bowel sounds normal in frequency in pitch, no bruits, no rebound, no guarding, no midline pulsatile mass, no hepatomegaly, no splenomegaly EXT:  2 plus pulses throughout, moderate edema, no cyanosis no clubbing SKIN:  No rashes no nodules NEURO:  Cranial nerves II through XII grossly intact, motor grossly intact throughout PSYCH:  Cognitively intact, oriented to person place and time    EKG:  EKG is ordered today. The ekg ordered today demonstrates atrial fibrillation with rapid rate, rate 130, axis within normal limits, intervals within normal limits, no acute ST-T wave changes.   Recent Labs: 08/27/2015: B Natriuretic Peptide 160.6*; TSH 1.027 08/31/2015: Hemoglobin 13.1; Platelets 213 09/08/2015: ALT 29; BUN 14; Creatinine, Ser 1.00; Potassium 4.5; Sodium 141    Lipid Panel    Component Value Date/Time   CHOL 178 09/08/2015 1201   CHOL 142 05/28/2013 1050   TRIG 93 09/08/2015 1201   TRIG 93 05/28/2013 1050   HDL 81 09/08/2015 1201   HDL 38* 05/28/2013 1050   CHOLHDL 2.2 09/08/2015 1201   LDLCALC 78 09/08/2015 1201   LDLCALC 85 05/28/2013 1050      Wt Readings from Last 3 Encounters:  09/09/15 239 lb (108.41 kg)  09/08/15 243 lb (110.224 kg)  09/01/15 247 lb 6.4 oz (112.22 kg)      Other studies Reviewed: Additional studies/ records that were reviewed today include: Hospital records. Review of the above records demonstrates:  Please see elsewhere in the note.     ASSESSMENT AND PLAN:  ATRIAL FIB:    The patient has rapid rate. He does not want to consider blood thinners at this point although he understands his risk of stroke. I will stop his Norvasc and increase his Cardizem to  360 mg daily. He'll come back in one month for further evaluation.  CM:  The patient had slightly reduced ejection fraction. He's not interested in right and left heart catheterization at this point. I'll be changing the medicines as above. I think he is euvolemic.  HEMATURIA:  I will need to check with urology to see if there is further workup of his hematuria. This will be applied towards the discussion on resuming anticoagulation.   Current medicines are reviewed at length with the patient today.  The patient does not have concerns regarding medicines.  The following changes have been made:  See above  Labs/ tests ordered today include:   Orders Placed This Encounter  Procedures  . EKG 12-Lead  Disposition:   FU with me in one month .   (Greater than 40 minutes reviewing all data with greater than 50% face to face with the patient).   Signed, Rollene Rotunda, MD  09/09/2015 5:22 PM    [facility]

## 2015-09-18 DIAGNOSIS — J449 Chronic obstructive pulmonary disease, unspecified: Secondary | ICD-10-CM | POA: Diagnosis not present

## 2015-09-21 ENCOUNTER — Encounter: Payer: Self-pay | Admitting: Family Medicine

## 2015-09-21 ENCOUNTER — Ambulatory Visit (INDEPENDENT_AMBULATORY_CARE_PROVIDER_SITE_OTHER): Payer: Medicare Other | Admitting: Family Medicine

## 2015-09-21 VITALS — Temp 98.1°F | Ht 68.0 in | Wt 241.2 lb

## 2015-09-21 DIAGNOSIS — J449 Chronic obstructive pulmonary disease, unspecified: Secondary | ICD-10-CM | POA: Diagnosis not present

## 2015-09-21 DIAGNOSIS — I10 Essential (primary) hypertension: Secondary | ICD-10-CM

## 2015-09-21 DIAGNOSIS — I482 Chronic atrial fibrillation, unspecified: Secondary | ICD-10-CM

## 2015-09-21 DIAGNOSIS — F419 Anxiety disorder, unspecified: Secondary | ICD-10-CM

## 2015-09-21 DIAGNOSIS — R319 Hematuria, unspecified: Secondary | ICD-10-CM | POA: Diagnosis not present

## 2015-09-21 NOTE — Progress Notes (Signed)
Subjective:  Patient ID: Logan West, male    DOB: 09-Sep-1934  Age: 81 y.o. MRN: [id number]  CC: Depression; GAD; and Hypertension   HPI Logan West presents for doing well  with nerves. Uses 1-2 lorazepam most days. On a bad day may use three especially if his wife's illness and or the associated bills are weighing on him. Hx of atrial fib. Off blood thinner due to hx of hematuria. Sees cardiology in 2 days. He was told last time with cards they would consider restarting thinner.   In for recheck of the pneumonia. He has not been coughing. He is no longer short of breath. He continues to wear oxygen due to COPD.   follow-up of hypertension. Patient has no history of headache chest pain or shortness of breath or recent cough. Patient also denies symptoms of TIA such as numbness weakness lateralizing. Patient does not check  blood pressure at home. Patient denies side effects from his medication. States taking it regularly.   History Logan West has a past medical history of Hypertension; Hyperlipidemia; COPD (chronic obstructive pulmonary disease) (HCC); Obesity; and Chronic pain.   He has past surgical history that includes Percutaneous pinning and Fracture surgery.   His family history includes Asthma in his father; Cancer in his mother; Coronary artery disease in his other; Heart attack in his brother.He reports that he quit smoking about 24 years ago. His smoking use included Cigarettes. He has a 62.5 pack-year smoking history. He has never used smokeless tobacco. His alcohol and drug histories are not on file.  Outpatient Prescriptions Prior to Visit  Medication Sig Dispense Refill  . albuterol (PROVENTIL HFA;VENTOLIN HFA) 108 (90 BASE) MCG/ACT inhaler Inhale 2 puffs into the lungs every 6 (six) hours as needed for wheezing or shortness of breath.    Marland Kitchen. albuterol (PROVENTIL) (2.5 MG/3ML) 0.083% nebulizer solution Take 3 mLs (2.5 mg total) by nebulization every 6 (six) hours as needed for  wheezing or shortness of breath. 75 mL 12  . aspirin 81 MG EC tablet Take 81 mg by mouth daily.  0  . budesonide-formoterol (SYMBICORT) 160-4.5 MCG/ACT inhaler Inhale 2 puffs into the lungs 2 (two) times daily. 1 Inhaler 5  . diltiazem (CARDIZEM CD) 360 MG 24 hr capsule Take 1 capsule (360 mg total) by mouth daily. 30 capsule 6  . furosemide (LASIX) 20 MG tablet TAKE 1 TABLET (20 MG TOTAL) BY MOUTH DAILY. 30 tablet 5  . guaiFENesin (MUCINEX) 600 MG 12 hr tablet Take 2 tablets (1,200 mg total) by mouth 2 (two) times daily. 20 tablet 1  . LORazepam (ATIVAN) 1 MG tablet Take 1 tablet (1 mg total) by mouth every 8 (eight) hours as needed for anxiety. 60 tablet 5  . metoprolol (LOPRESSOR) 50 MG tablet TAKE 1 TABLET (50 MG TOTAL) BY MOUTH DAILY. 30 tablet 5  . pravastatin (PRAVACHOL) 40 MG tablet TAKE 1 TABLET (40 MG TOTAL) BY MOUTH DAILY. 30 tablet 2  . traMADol (ULTRAM) 50 MG tablet TAKE 1 TABLET BY MOUTH EVERY eight HOURS AS NEEDED FOR MODERATE PAIN 90 tablet 5  . predniSONE (DELTASONE) 20 MG tablet Take 2 tablets (40 mg total) by mouth daily with breakfast. 2 tablets daily for 3 days then 1 tablet for 3 days then half a tablet for 4 days (Patient not taking: Reported on 09/21/2015) 11 tablet 0   No facility-administered medications prior to visit.    ROS Review of Systems  Objective:  Temp(Src) 98.1 F (36.7 C) (Oral)  Ht  (1.727 m)  Wt 241 lb 3.2 oz (109.408 kg)  BMI 36.68 kg/m2  BP Readings from Last 3 Encounters:  09/09/15 114/73  09/01/15 122/59  08/17/15 117/65    Wt Readings from Last 3 Encounters:  09/21/15 241 lb 3.2 oz (109.408 kg)  09/09/15 239 lb (108.41 kg)  09/08/15 243 lb (110.224 kg)     Physical Exam  Lab Results  Component Value Date   HGBA1C 6.0* 08/27/2015    Lab Results  Component Value Date   WBC 10.2 09/08/2015   HGB 13.1 08/31/2015   HCT 46.7 09/08/2015   PLT 213 08/31/2015   GLUCOSE 95 09/08/2015   CHOL 178 09/08/2015   TRIG 93  09/08/2015   HDL 81 09/08/2015   LDLCALC 78 09/08/2015   ALT 29 09/08/2015   AST 21 09/08/2015   NA 141 09/08/2015   K 4.5 09/08/2015   CL 94* 09/08/2015   CREATININE 1.00 09/08/2015   BUN 14 09/08/2015   CO2 33* 09/08/2015   TSH 1.027 08/27/2015   PSA 1.9 03/31/2014   INR 1.38 08/27/2015   HGBA1C 6.0* 08/27/2015    Ct Chest Wo Contrast  08/27/2015  CLINICAL DATA:  Shortness of breath 2 weeks.  Coughing.  Chest pain. EXAM: CT CHEST WITHOUT CONTRAST TECHNIQUE: Multidetector CT imaging of the chest was performed following the standard protocol without IV contrast. COMPARISON:  None. FINDINGS: The central airways are patent. There is a small left pleural effusion. There is left lower lobe airspace disease. There is lingular atelectasis and mild scarring. There a trace right pleural effusion. There are patchy areas of ground-glass opacity in the right upper lobe. There is mild reticular nodular interstitial disease at the right lung base. There is no pneumothorax. There are no pathologically enlarged axillary, hilar or mediastinal lymph nodes. The heart size is normal. There is no pericardial effusion. The thoracic aorta is normal in caliber. There is thoracic aortic atherosclerosis. Review of bone windows demonstrates no focal lytic or sclerotic lesions. Limited non-contrast images of the upper abdomen were obtained. There is high attenuation material dependently within the gallbladder which may represent cholelithiasis versus gallbladder sludge. The remainder of the visualized abdominal organs are unremarkable. IMPRESSION: 1. Left lower lobe pneumonia with a small parapneumonic effusion. 2. Mild reticular nodular interstitial disease at the right lung base likely secondary to an infectious etiology. Trace right pleural effusion. Electronically Signed   By: Elige Ko   On: 08/27/2015 18:05   Dg Chest Port 1 View  08/27/2015  CLINICAL DATA:  Increased shortness of Breath EXAM: PORTABLE CHEST - 1  VIEW COMPARISON:  08/01/2015 FINDINGS: Cardiac shadow is at the upper limits of normal in size but stable from the prior study. Increasing infiltrative changes are noted in the bases bilaterally particularly in the left lung base when compare with the prior study. No sizable effusion is seen. No bony abnormality is noted. IMPRESSION: Increasing bibasilar infiltrates left greater than right. Followup PA and lateral chest X-ray is recommended in 3-4 weeks following trial of antibiotic therapy to ensure resolution and exclude underlying malignancy. Electronically Signed   By: Alcide Clever M.D.   On: 08/27/2015 14:36    Assessment & Plan:   Logan West was seen today for depression, gad and hypertension.  Diagnoses and all orders for this visit:  Chronic atrial fibrillation (HCC)  Essential hypertension  Hematuria  Anxiety  COPD mixed type (HCC)   I have discontinued Mr. Samaras's predniSONE. I am also  having him maintain his budesonide-formoterol, furosemide, metoprolol, albuterol, albuterol, guaiFENesin, traMADol, LORazepam, pravastatin, aspirin, diltiazem, amLODipine, and tamsulosin.  Meds ordered this encounter  Medications  . amLODipine (NORVASC) 10 MG tablet    Sig: Take 1 tablet by mouth daily.  . tamsulosin (FLOMAX) 0.4 MG CAPS capsule    Sig: Take 1 capsule by mouth daily.     Follow-up: Return in about 3 months (around 12/22/2015) for COPD, hypertension, a.fib, anxiety.  Mechele Claude, M.D.

## 2015-09-23 ENCOUNTER — Encounter: Payer: Self-pay | Admitting: Cardiology

## 2015-09-23 ENCOUNTER — Ambulatory Visit (INDEPENDENT_AMBULATORY_CARE_PROVIDER_SITE_OTHER): Payer: Medicare Other | Admitting: Cardiology

## 2015-09-23 VITALS — BP 120/60 | HR 92 | Ht 68.0 in | Wt 242.0 lb

## 2015-09-23 DIAGNOSIS — I4891 Unspecified atrial fibrillation: Secondary | ICD-10-CM

## 2015-09-23 NOTE — Patient Instructions (Signed)
Medication Instructions:  The current medical regimen is effective;  continue present plan and medications.  Follow-Up: [place] up [place]rein [place] at the end of December.  Thank you for choosing [facility]!!     Please remember to ask your Urologist about restarting a blood thinner.

## 2015-09-23 NOTE — Progress Notes (Signed)
Cardiology Office Note   Date:  09/23/2015   ID:  Logan West, DOB Apr 26, 1934, MRN [id number]  PCP:  Mechele ClaudeSTACKS,WARREN, MD  Cardiologist:   Rollene RotundaJames Kenwood Rosiak, MD   Chief Complaint  Patient presents with  . Atrial Fibrillation      History of Present Illness: Logan StaggersHorace West is a 81 y.o. male who presents for evaluation of atrial fibrillation. I saw him recently for this. It had been for years since his last appointment. He had atrial fibrillation with a rapid rate. He had a slightly reduced ejection fraction. There was suggested that he might need right and left heart catheterization although he did not want to have this done. He had been on anticoagulation but he had some hematuria and Eliquis I'll this was stopped. When I saw him I increased his Cardizem and he returns for follow-up. He is chronically on O2. He gets around a little bit in the house. He does not notice heart racing. He's had chronic dyspnea but no new shortness of breath, PND or orthopnea. He doesn't have any presyncope or syncope. His daughter prompted me to ask him about chest pain but he says he's only had one episode of is related to his heartburn and is otherwise not getting this. He is to see the urologist follow-up of hematuria these no longer having hematuria.   Past Medical History  Diagnosis Date  . Hypertension     x30 years  . Hyperlipidemia     x 7-8  . COPD (chronic obstructive pulmonary disease) (HCC)   . Obesity   . Chronic pain     Past Surgical History  Procedure Laterality Date  . Percutaneous pinning      right forearm fracture  . Fracture surgery      right forearm     Current Outpatient Prescriptions  Medication Sig Dispense Refill  . albuterol (PROVENTIL HFA;VENTOLIN HFA) 108 (90 BASE) MCG/ACT inhaler Inhale 2 puffs into the lungs every 6 (six) hours as needed for wheezing or shortness of breath.    Marland Kitchen. albuterol (PROVENTIL) (2.5 MG/3ML) 0.083% nebulizer solution Take 3 mLs (2.5 mg total) by  nebulization every 6 (six) hours as needed for wheezing or shortness of breath. 75 mL 12  . amLODipine (NORVASC) 10 MG tablet Take 1 tablet by mouth daily.    Marland Kitchen. aspirin 81 MG EC tablet Take 81 mg by mouth daily.  0  . budesonide-formoterol (SYMBICORT) 160-4.5 MCG/ACT inhaler Inhale 2 puffs into the lungs 2 (two) times daily. 1 Inhaler 5  . diltiazem (CARDIZEM CD) 360 MG 24 hr capsule Take 1 capsule (360 mg total) by mouth daily. 30 capsule 6  . furosemide (LASIX) 20 MG tablet TAKE 1 TABLET (20 MG TOTAL) BY MOUTH DAILY. 30 tablet 5  . LORazepam (ATIVAN) 1 MG tablet Take 1 tablet (1 mg total) by mouth every 8 (eight) hours as needed for anxiety. 60 tablet 5  . metoprolol (LOPRESSOR) 50 MG tablet TAKE 1 TABLET (50 MG TOTAL) BY MOUTH DAILY. 30 tablet 5  . pravastatin (PRAVACHOL) 40 MG tablet TAKE 1 TABLET (40 MG TOTAL) BY MOUTH DAILY. 30 tablet 2  . tamsulosin (FLOMAX) 0.4 MG CAPS capsule Take 1 capsule by mouth daily.    . traMADol (ULTRAM) 50 MG tablet TAKE 1 TABLET BY MOUTH EVERY eight HOURS AS NEEDED FOR MODERATE PAIN 90 tablet 5  . guaiFENesin (MUCINEX) 600 MG 12 hr tablet Take 2 tablets (1,200 mg total) by mouth 2 (two) times daily. (Patient  not taking: Reported on 09/23/2015) 20 tablet 1   No current facility-administered medications for this visit.    Allergies:   Review of patient's allergies indicates no known allergies.    ROS:  Please see the history of present illness.   Otherwise, review of systems are positive for none.   All other systems are reviewed and negative.    PHYSICAL EXAM: VS:  BP 120/60 mmHg  Pulse 92  Ht  (1.727 m)  Wt 242 lb (109.77 kg)  BMI 36.80 kg/m2 , BMI Body mass index is 36.8 kg/(m^2). GENERAL:  Well appearing HEENT:  Pupils equal round and reactive, fundi not visualized, oral mucosa unremarkable NECK:  No jugular venous distention, waveform within normal limits, carotid upstroke brisk and symmetric, no bruits, no thyromegaly LUNGS:  Clear to  auscultation bilaterally BACK:  No CVA tenderness CHEST:  Unremarkable HEART:  PMI not displaced or sustained,S1 and S2 within normal limits, no S3,  no clicks, no rubs, no murmurs, irregular ABD:  Flat, positive bowel sounds normal in frequency in pitch, no bruits, no rebound, no guarding, no midline pulsatile mass, no hepatomegaly, no splenomegaly EXT:  2 plus pulses throughout, moderate edema, no cyanosis no clubbing    EKG:  EKG is not ordered today.    Recent Labs: 08/27/2015: B Natriuretic Peptide 160.6*; TSH 1.027 08/31/2015: Hemoglobin 13.1; Platelets 213 09/08/2015: ALT 29; BUN 14; Creatinine, Ser 1.00; Potassium 4.5; Sodium 141    Lipid Panel    Component Value Date/Time   CHOL 178 09/08/2015 1201   CHOL 142 05/28/2013 1050   TRIG 93 09/08/2015 1201   TRIG 93 05/28/2013 1050   HDL 81 09/08/2015 1201   HDL 38* 05/28/2013 1050   CHOLHDL 2.2 09/08/2015 1201   LDLCALC 78 09/08/2015 1201   LDLCALC 85 05/28/2013 1050      Wt Readings from Last 3 Encounters:  09/23/15 242 lb (109.77 kg)  09/21/15 241 lb 3.2 oz (109.408 kg)  09/09/15 239 lb (108.41 kg)      Other studies Reviewed: Additional studies/ records that were reviewed today include: Hospital records. Review of the above records demonstrates:  Please see elsewhere in the note.     ASSESSMENT AND PLAN:  ATRIAL FIB:    His heart rate seems to be controlled now.  CM:  The patient had slightly reduced ejection fraction. He's not interested in right and left heart catheterization at this point.  I think he is euvolemic. For now he will remain on the meds as listed.  Mr. [name] has a CHA2DS2 - VASc score of 3 with a risk of stroke of 3.2%.  HEMATURIA:  I did speak with the patient and his daughter. He's going to have follow-up and I asked him to specifically asked whether he was in increased risk anticoagulation if he is cleared from a urologic standpoint.   Current medicines are reviewed at length with  the patient today.  The patient does not have concerns regarding medicines.  The following changes have been made:  See above  Labs/ tests ordered today include:   No orders of the defined types were placed in this encounter.     Disposition:   FU with me in Dec   Signed, Rollene Rotunda, MD  09/23/2015 1:18 PM    [facility]

## 2015-09-27 ENCOUNTER — Other Ambulatory Visit: Payer: Self-pay | Admitting: Physician Assistant

## 2015-10-03 DIAGNOSIS — J449 Chronic obstructive pulmonary disease, unspecified: Secondary | ICD-10-CM | POA: Diagnosis not present

## 2015-10-07 ENCOUNTER — Ambulatory Visit (INDEPENDENT_AMBULATORY_CARE_PROVIDER_SITE_OTHER): Payer: Medicare Other | Admitting: Urology

## 2015-10-07 DIAGNOSIS — R351 Nocturia: Secondary | ICD-10-CM | POA: Diagnosis not present

## 2015-10-07 DIAGNOSIS — N401 Enlarged prostate with lower urinary tract symptoms: Secondary | ICD-10-CM | POA: Diagnosis not present

## 2015-10-07 DIAGNOSIS — N3941 Urge incontinence: Secondary | ICD-10-CM

## 2015-10-07 DIAGNOSIS — N400 Enlarged prostate without lower urinary tract symptoms: Secondary | ICD-10-CM | POA: Diagnosis not present

## 2015-10-19 DIAGNOSIS — J449 Chronic obstructive pulmonary disease, unspecified: Secondary | ICD-10-CM | POA: Diagnosis not present

## 2015-11-02 DIAGNOSIS — J449 Chronic obstructive pulmonary disease, unspecified: Secondary | ICD-10-CM | POA: Diagnosis not present

## 2015-11-04 ENCOUNTER — Ambulatory Visit (INDEPENDENT_AMBULATORY_CARE_PROVIDER_SITE_OTHER): Payer: Medicare Other | Admitting: Cardiology

## 2015-11-04 ENCOUNTER — Encounter: Payer: Self-pay | Admitting: Cardiology

## 2015-11-04 ENCOUNTER — Other Ambulatory Visit: Payer: Medicare Other

## 2015-11-04 VITALS — BP 99/58 | HR 85 | Ht 68.0 in | Wt 239.0 lb

## 2015-11-04 DIAGNOSIS — R0602 Shortness of breath: Secondary | ICD-10-CM | POA: Diagnosis not present

## 2015-11-04 DIAGNOSIS — I10 Essential (primary) hypertension: Secondary | ICD-10-CM | POA: Diagnosis not present

## 2015-11-04 MED ORDER — FUROSEMIDE 20 MG PO TABS: ORAL_TABLET | ORAL | Status: DC

## 2015-11-04 NOTE — Progress Notes (Signed)
Cardiology Office Note   Date:  11/04/2015   ID:  Logan StaggersHorace Lender, DOB Jul 09, 1934, MRN [id number]  PCP:  Mechele ClaudeSTACKS,WARREN, MD  Cardiologist:   Rollene RotundaJames Estel Scholze, MD   No chief complaint on file.     History of Present Illness: Logan West is a 81 y.o. male who presents for evaluation of atrial fibrillation with a rapid ventricular rate.  When he has been seen recently it was suggested that he might need right and left heart catheterization although he did not want to have this done. He had been on anticoagulation but he had some hematuria and Eliquis.  This was stopped.  He is chronically on O2. He gets around a little bit in the house. He does not notice heart racing. He's had chronic dyspnea.  Recently he's been out of his Lasix. He's had some increased cough productive of some white sputum.  He has had no new PND or orthopnea. He doesn't have any presyncope or syncope.   Past Medical History  Diagnosis Date  . Hypertension     x30 years  . Hyperlipidemia     x 7-8  . COPD (chronic obstructive pulmonary disease) (HCC)   . Obesity   . Chronic pain     Past Surgical History  Procedure Laterality Date  . Percutaneous pinning      right forearm fracture  . Fracture surgery      right forearm     Current Outpatient Prescriptions  Medication Sig Dispense Refill  . albuterol (PROVENTIL HFA;VENTOLIN HFA) 108 (90 BASE) MCG/ACT inhaler Inhale 2 puffs into the lungs every 6 (six) hours as needed for wheezing or shortness of breath.    Marland Kitchen. albuterol (PROVENTIL) (2.5 MG/3ML) 0.083% nebulizer solution Take 3 mLs (2.5 mg total) by nebulization every 6 (six) hours as needed for wheezing or shortness of breath. 75 mL 12  . amLODipine (NORVASC) 10 MG tablet Take 10 mg by mouth daily.    Marland Kitchen. aspirin 81 MG EC tablet Take 81 mg by mouth daily.  0  . budesonide-formoterol (SYMBICORT) 160-4.5 MCG/ACT inhaler Inhale 2 puffs into the lungs 2 (two) times daily. 1 Inhaler 5  . diltiazem (CARDIZEM CD) 360  MG 24 hr capsule Take 1 capsule (360 mg total) by mouth daily. 30 capsule 6  . furosemide (LASIX) 20 MG tablet TAKE 1 TABLET (20 MG TOTAL) BY MOUTH DAILY. 30 tablet 5  . guaiFENesin (MUCINEX) 600 MG 12 hr tablet Take 2 tablets (1,200 mg total) by mouth 2 (two) times daily. 20 tablet 1  . LORazepam (ATIVAN) 1 MG tablet Take 1 tablet (1 mg total) by mouth every 8 (eight) hours as needed for anxiety. 60 tablet 5  . metoprolol (LOPRESSOR) 50 MG tablet TAKE 1 TABLET (50 MG TOTAL) BY MOUTH DAILY. 30 tablet 5  . pravastatin (PRAVACHOL) 40 MG tablet TAKE 1 TABLET (40 MG TOTAL) BY MOUTH DAILY. 30 tablet 2  . tamsulosin (FLOMAX) 0.4 MG CAPS capsule Take 1 capsule by mouth daily.    . traMADol (ULTRAM) 50 MG tablet TAKE 1 TABLET BY MOUTH EVERY eight HOURS AS NEEDED FOR MODERATE PAIN 90 tablet 5   No current facility-administered medications for this visit.    Allergies:   Review of patient's allergies indicates no known allergies.    ROS:  Please see the history of present illness.   Otherwise, review of systems are positive for none.   All other systems are reviewed and negative.    PHYSICAL EXAM: VS:  BP 99/58 mmHg  Pulse 85  Ht  (1.727 m)  Wt 239 lb (108.41 kg)  BMI 36.35 kg/m2 , BMI Body mass index is 36.35 kg/(m^2). GENERAL:  Chronically ill-appearing and slightly unkempt HEENT:  Pupils equal round and reactive, fundi not visualized, oral mucosa unremarkable NECK:  No jugular venous distention, waveform within normal limits, carotid upstroke brisk and symmetric, no bruits, no thyromegaly LUNGS:  Decreased breath sounds bilaterally with few scattered crackles BACK:  No CVA tenderness CHEST:  Unremarkable HEART:  PMI not displaced or sustained,S1 and S2 within normal limits, no S3,  no clicks, no rubs, no murmurs, irregular ABD:  Flat, positive bowel sounds normal in frequency in pitch, no bruits, no rebound, no guarding, no midline pulsatile mass, no hepatomegaly, no splenomegaly EXT:   2 plus pulses throughout, moderate edema, no cyanosis no clubbing   EKG:  EKG is not ordered today.   Recent Labs: 08/27/2015: B Natriuretic Peptide 160.6*; TSH 1.027 08/31/2015: Hemoglobin 13.1; Platelets 213 09/08/2015: ALT 29; BUN 14; Creatinine, Ser 1.00; Potassium 4.5; Sodium 141    Lipid Panel    Component Value Date/Time   CHOL 178 09/08/2015 1201   CHOL 142 05/28/2013 1050   TRIG 93 09/08/2015 1201   TRIG 93 05/28/2013 1050   HDL 81 09/08/2015 1201   HDL 38* 05/28/2013 1050   CHOLHDL 2.2 09/08/2015 1201   LDLCALC 78 09/08/2015 1201   LDLCALC 85 05/28/2013 1050      Wt Readings from Last 3 Encounters:  11/04/15 239 lb (108.41 kg)  09/23/15 242 lb (109.77 kg)  09/21/15 241 lb 3.2 oz (109.408 kg)      Other studies Reviewed: Additional studies/ records that were reviewed today include: Hospital records. Review of the above records demonstrates:  Please see elsewhere in the note.     ASSESSMENT AND PLAN:  ATRIAL FIB:    His heart rate seems to be controlled.  Mr. Reyn Faivre has a CHA2DS2 - VASc score of 3 with a risk of stroke of 3.2%.  However, he still doesn't want to take anticoagulation mostly secondary to cost. He refuses warfarin. Of note he did check with his urologist and apparently he is cleared to restart anticoagulation if he would comply.  At this point he's not willing to take anticoagulation.  CM:  The patient had slightly reduced ejection fraction. He's not interested in right and left heart catheterization at this point.   I do think he might have some slightly excess volume. I'm going to check a BNP level. I'm going to restart the Lasix which he has stopped.  HEMATURIA:  I will get the latest urology office visit.   Current medicines are reviewed at length with the patient today.  The patient does not have concerns regarding medicines.  The following changes have been made:  See above  Labs/ tests ordered today include:   No orders of the  defined types were placed in this encounter.     Disposition:   FU with me in two months.    Signed, Rollene Rotunda, MD  11/04/2015 1:22 PM    [facility]

## 2015-11-04 NOTE — Progress Notes (Signed)
Lab only 

## 2015-11-04 NOTE — Patient Instructions (Signed)
Medication Instructions:  The current medical regimen is effective;  continue present plan and medications.  Labwork: Please have blood work today (BNP)  Follow-Up: Follow up in 2 months with Dr Antoine PocheHochrein.  If you need a refill on your cardiac medications before your next appointment, please call your pharmacy.  Thank you for choosing [facility]!!

## 2015-11-05 ENCOUNTER — Telehealth: Payer: Self-pay | Admitting: Cardiology

## 2015-11-05 LAB — BRAIN NATRIURETIC PEPTIDE: BNP: 126.5 pg/mL — AB (ref 0.0–100.0)

## 2015-11-05 NOTE — Telephone Encounter (Signed)
ROI faxed to [facility] records rec [facility] for Dr.Hochrein

## 2015-11-12 ENCOUNTER — Other Ambulatory Visit: Payer: Self-pay | Admitting: Family Medicine

## 2015-11-18 DIAGNOSIS — J449 Chronic obstructive pulmonary disease, unspecified: Secondary | ICD-10-CM | POA: Diagnosis not present

## 2015-11-19 ENCOUNTER — Telehealth: Payer: Self-pay | Admitting: Cardiology

## 2015-11-19 NOTE — Telephone Encounter (Signed)
Received records from [facility] for appointment on 12/30/15 with Dr Antoine PocheHochrein.  [facility] Dr Hochrein's schedule on 12/30/15. lp

## 2015-11-28 ENCOUNTER — Inpatient Hospital Stay (HOSPITAL_COMMUNITY)
Admission: EM | Admit: 2015-11-28 | Discharge: 2015-12-08 | DRG: 870 | Disposition: A | Discharge status: Skilled Nursing Facility | Payer: Medicare Other | Attending: Internal Medicine | Admitting: Internal Medicine

## 2015-11-28 ENCOUNTER — Encounter (HOSPITAL_COMMUNITY): Payer: Self-pay | Admitting: *Deleted

## 2015-11-28 ENCOUNTER — Emergency Department (HOSPITAL_COMMUNITY): Payer: Medicare Other

## 2015-11-28 DIAGNOSIS — Z7982 Long term (current) use of aspirin: Secondary | ICD-10-CM

## 2015-11-28 DIAGNOSIS — Z7951 Long term (current) use of inhaled steroids: Secondary | ICD-10-CM

## 2015-11-28 DIAGNOSIS — E785 Hyperlipidemia, unspecified: Secondary | ICD-10-CM | POA: Diagnosis not present

## 2015-11-28 DIAGNOSIS — T68XXXA Hypothermia, initial encounter: Secondary | ICD-10-CM | POA: Diagnosis present

## 2015-11-28 DIAGNOSIS — Z66 Do not resuscitate: Secondary | ICD-10-CM | POA: Diagnosis not present

## 2015-11-28 DIAGNOSIS — J9622 Acute and chronic respiratory failure with hypercapnia: Secondary | ICD-10-CM | POA: Diagnosis present

## 2015-11-28 DIAGNOSIS — I482 Chronic atrial fibrillation, unspecified: Secondary | ICD-10-CM | POA: Diagnosis present

## 2015-11-28 DIAGNOSIS — I5043 Acute on chronic combined systolic (congestive) and diastolic (congestive) heart failure: Secondary | ICD-10-CM | POA: Diagnosis not present

## 2015-11-28 DIAGNOSIS — Z825 Family history of asthma and other chronic lower respiratory diseases: Secondary | ICD-10-CM

## 2015-11-28 DIAGNOSIS — Z23 Encounter for immunization: Secondary | ICD-10-CM | POA: Diagnosis not present

## 2015-11-28 DIAGNOSIS — R739 Hyperglycemia, unspecified: Secondary | ICD-10-CM | POA: Diagnosis present

## 2015-11-28 DIAGNOSIS — E872 Acidosis: Secondary | ICD-10-CM | POA: Diagnosis present

## 2015-11-28 DIAGNOSIS — G894 Chronic pain syndrome: Secondary | ICD-10-CM | POA: Diagnosis present

## 2015-11-28 DIAGNOSIS — Z8249 Family history of ischemic heart disease and other diseases of the circulatory system: Secondary | ICD-10-CM | POA: Diagnosis not present

## 2015-11-28 DIAGNOSIS — A419 Sepsis, unspecified organism: Principal | ICD-10-CM | POA: Diagnosis present

## 2015-11-28 DIAGNOSIS — Z4682 Encounter for fitting and adjustment of non-vascular catheter: Secondary | ICD-10-CM | POA: Diagnosis not present

## 2015-11-28 DIAGNOSIS — H109 Unspecified conjunctivitis: Secondary | ICD-10-CM | POA: Diagnosis not present

## 2015-11-28 DIAGNOSIS — Z4659 Encounter for fitting and adjustment of other gastrointestinal appliance and device: Secondary | ICD-10-CM

## 2015-11-28 DIAGNOSIS — J9601 Acute respiratory failure with hypoxia: Secondary | ICD-10-CM | POA: Diagnosis not present

## 2015-11-28 DIAGNOSIS — J440 Chronic obstructive pulmonary disease with acute lower respiratory infection: Secondary | ICD-10-CM | POA: Diagnosis not present

## 2015-11-28 DIAGNOSIS — E669 Obesity, unspecified: Secondary | ICD-10-CM | POA: Diagnosis not present

## 2015-11-28 DIAGNOSIS — T380X5A Adverse effect of glucocorticoids and synthetic analogues, initial encounter: Secondary | ICD-10-CM | POA: Diagnosis present

## 2015-11-28 DIAGNOSIS — J449 Chronic obstructive pulmonary disease, unspecified: Secondary | ICD-10-CM | POA: Diagnosis not present

## 2015-11-28 DIAGNOSIS — Z9911 Dependence on respirator [ventilator] status: Secondary | ICD-10-CM | POA: Diagnosis not present

## 2015-11-28 DIAGNOSIS — I110 Hypertensive heart disease with heart failure: Secondary | ICD-10-CM | POA: Diagnosis present

## 2015-11-28 DIAGNOSIS — J90 Pleural effusion, not elsewhere classified: Secondary | ICD-10-CM | POA: Diagnosis not present

## 2015-11-28 DIAGNOSIS — I5042 Chronic combined systolic (congestive) and diastolic (congestive) heart failure: Secondary | ICD-10-CM | POA: Diagnosis present

## 2015-11-28 DIAGNOSIS — Z9981 Dependence on supplemental oxygen: Secondary | ICD-10-CM | POA: Diagnosis not present

## 2015-11-28 DIAGNOSIS — J9690 Respiratory failure, unspecified, unspecified whether with hypoxia or hypercapnia: Secondary | ICD-10-CM | POA: Diagnosis not present

## 2015-11-28 DIAGNOSIS — R6520 Severe sepsis without septic shock: Secondary | ICD-10-CM | POA: Diagnosis not present

## 2015-11-28 DIAGNOSIS — R0602 Shortness of breath: Secondary | ICD-10-CM | POA: Diagnosis present

## 2015-11-28 DIAGNOSIS — Z87891 Personal history of nicotine dependence: Secondary | ICD-10-CM | POA: Diagnosis not present

## 2015-11-28 DIAGNOSIS — Z7189 Other specified counseling: Secondary | ICD-10-CM | POA: Diagnosis not present

## 2015-11-28 DIAGNOSIS — J9691 Respiratory failure, unspecified with hypoxia: Secondary | ICD-10-CM | POA: Diagnosis not present

## 2015-11-28 DIAGNOSIS — E876 Hypokalemia: Secondary | ICD-10-CM | POA: Diagnosis not present

## 2015-11-28 DIAGNOSIS — Z515 Encounter for palliative care: Secondary | ICD-10-CM | POA: Diagnosis not present

## 2015-11-28 DIAGNOSIS — J9602 Acute respiratory failure with hypercapnia: Secondary | ICD-10-CM | POA: Diagnosis not present

## 2015-11-28 DIAGNOSIS — J189 Pneumonia, unspecified organism: Secondary | ICD-10-CM | POA: Diagnosis present

## 2015-11-28 DIAGNOSIS — Z6833 Body mass index (BMI) 33.0-33.9, adult: Secondary | ICD-10-CM

## 2015-11-28 DIAGNOSIS — E662 Morbid (severe) obesity with alveolar hypoventilation: Secondary | ICD-10-CM | POA: Diagnosis present

## 2015-11-28 DIAGNOSIS — T50905A Adverse effect of unspecified drugs, medicaments and biological substances, initial encounter: Secondary | ICD-10-CM

## 2015-11-28 DIAGNOSIS — Y95 Nosocomial condition: Secondary | ICD-10-CM | POA: Diagnosis present

## 2015-11-28 DIAGNOSIS — J441 Chronic obstructive pulmonary disease with (acute) exacerbation: Secondary | ICD-10-CM | POA: Diagnosis not present

## 2015-11-28 DIAGNOSIS — M6281 Muscle weakness (generalized): Secondary | ICD-10-CM | POA: Diagnosis not present

## 2015-11-28 DIAGNOSIS — R0600 Dyspnea, unspecified: Secondary | ICD-10-CM | POA: Diagnosis not present

## 2015-11-28 DIAGNOSIS — R0682 Tachypnea, not elsewhere classified: Secondary | ICD-10-CM | POA: Diagnosis not present

## 2015-11-28 DIAGNOSIS — J9621 Acute and chronic respiratory failure with hypoxia: Secondary | ICD-10-CM | POA: Diagnosis present

## 2015-11-28 DIAGNOSIS — J9811 Atelectasis: Secondary | ICD-10-CM | POA: Diagnosis not present

## 2015-11-28 HISTORY — DX: Heart failure, unspecified: I50.9

## 2015-11-28 HISTORY — DX: Unspecified atrial fibrillation: I48.91

## 2015-11-28 LAB — COMPREHENSIVE METABOLIC PANEL
ALT: 13 U/L — ABNORMAL LOW (ref 17–63)
AST: 18 U/L (ref 15–41)
Albumin: 3.4 g/dL — ABNORMAL LOW (ref 3.5–5.0)
Alkaline Phosphatase: 75 U/L (ref 38–126)
Anion gap: 8 (ref 5–15)
BILIRUBIN TOTAL: 1.6 mg/dL — AB (ref 0.3–1.2)
BUN: 19 mg/dL (ref 6–20)
CO2: 36 mmol/L — AB (ref 22–32)
Calcium: 9.1 mg/dL (ref 8.9–10.3)
Chloride: 98 mmol/L — ABNORMAL LOW (ref 101–111)
Creatinine, Ser: 1.12 mg/dL (ref 0.61–1.24)
GFR calc Af Amer: >60 mL/min (ref >60)
GFR calc non Af Amer: >60 mL/min (ref >60)
GLUCOSE: 181 mg/dL — AB (ref 65–99)
POTASSIUM: 5 mmol/L (ref 3.5–5.1)
Sodium: 142 mmol/L (ref 135–145)
TOTAL PROTEIN: 6.8 g/dL (ref 6.5–8.1)

## 2015-11-28 LAB — CBC WITH DIFFERENTIAL/PLATELET
BASOS ABS: 0 10*3/uL (ref 0.0–0.1)
Basophils Relative: 0 %
EOS PCT: 0 %
Eosinophils Absolute: 0 10*3/uL (ref 0.0–0.7)
HEMATOCRIT: 40.2 % (ref 39.0–52.0)
Hemoglobin: 12.6 g/dL — ABNORMAL LOW (ref 13.0–17.0)
LYMPHS PCT: 7 %
Lymphs Abs: 0.5 10*3/uL — ABNORMAL LOW (ref 0.7–4.0)
MCH: 30.6 pg (ref 26.0–34.0)
MCHC: 31.3 g/dL (ref 30.0–36.0)
MCV: 97.6 fL (ref 78.0–100.0)
MONO ABS: 0.3 10*3/uL (ref 0.1–1.0)
MONOS PCT: 4 %
NEUTROS ABS: 6.5 10*3/uL (ref 1.7–7.7)
Neutrophils Relative %: 89 %
PLATELETS: 159 10*3/uL (ref 150–400)
RBC: 4.12 MIL/uL — ABNORMAL LOW (ref 4.22–5.81)
RDW: 13.2 % (ref 11.5–15.5)
WBC: 7.3 10*3/uL (ref 4.0–10.5)

## 2015-11-28 LAB — TRIGLYCERIDES: TRIGLYCERIDES: 61 mg/dL (ref <150)

## 2015-11-28 LAB — URINALYSIS, ROUTINE W REFLEX MICROSCOPIC
Glucose, UA: NEGATIVE mg/dL
HGB URINE DIPSTICK: NEGATIVE
Ketones, ur: NEGATIVE mg/dL
Leukocytes, UA: NEGATIVE
Nitrite: NEGATIVE
PH: 6 (ref 5.0–8.0)
Protein, ur: NEGATIVE mg/dL

## 2015-11-28 LAB — BLOOD GAS, ARTERIAL
ACID-BASE EXCESS: 6.3 mmol/L — AB (ref 0.0–2.0)
Bicarbonate: 27.3 mEq/L — ABNORMAL HIGH (ref 20.0–24.0)
DRAWN BY: 21310
FIO2: 100
MECHVT: 550 mL
O2 SAT: 99.6 %
PCO2 ART: 90.1 mmHg — AB (ref 35.0–45.0)
PEEP: 5 cmH2O
PH ART: 7.201 — AB (ref 7.350–7.450)
PO2 ART: 262 mmHg — AB (ref 80.0–100.0)
RATE: 14 resp/min
TCO2: 18.1 mmol/L (ref 0–100)

## 2015-11-28 LAB — TROPONIN I: Troponin I: <0.03 ng/mL (ref <0.031)

## 2015-11-28 LAB — CBG MONITORING, ED: GLUCOSE-CAPILLARY: 157 mg/dL — AB (ref 65–99)

## 2015-11-28 LAB — LACTIC ACID, PLASMA: LACTIC ACID, VENOUS: 1.6 mmol/L (ref 0.5–2.0)

## 2015-11-28 LAB — BRAIN NATRIURETIC PEPTIDE: B Natriuretic Peptide: 128 pg/mL — ABNORMAL HIGH (ref 0.0–100.0)

## 2015-11-28 MED ORDER — ETOMIDATE 2 MG/ML IV SOLN
INTRAVENOUS | Status: AC | PRN
  Administered 2015-11-28: 30 mg via INTRAVENOUS

## 2015-11-28 MED ORDER — CHLORHEXIDINE GLUCONATE 0.12% ORAL RINSE (MEDLINE KIT)
15.0000 mL | Freq: Two times a day (BID) | OROMUCOSAL | Status: DC
  Administered 2015-11-29 – 2015-12-05 (×13): 15 mL via OROMUCOSAL

## 2015-11-28 MED ORDER — VANCOMYCIN HCL IN DEXTROSE 1-5 GM/200ML-% IV SOLN
1000.0000 mg | INTRAVENOUS | Status: AC
  Administered 2015-11-28 (×2): 1000 mg via INTRAVENOUS
  Filled 2015-11-28 (×2): qty 200

## 2015-11-28 MED ORDER — SODIUM CHLORIDE 0.9 % IV SOLN
1.0000 mg/h | INTRAVENOUS | Status: DC
  Administered 2015-11-28 – 2015-11-29 (×3): 1 mg/h via INTRAVENOUS
  Filled 2015-11-28: qty 10

## 2015-11-28 MED ORDER — ALBUTEROL (5 MG/ML) CONTINUOUS INHALATION SOLN
10.0000 mg/h | INHALATION_SOLUTION | RESPIRATORY_TRACT | Status: DC
  Administered 2015-11-28: 10 mg/h via RESPIRATORY_TRACT
  Filled 2015-11-28: qty 20

## 2015-11-28 MED ORDER — PIPERACILLIN-TAZOBACTAM 3.375 G IVPB 30 MIN
3.3750 g | Freq: Once | INTRAVENOUS | Status: AC
  Administered 2015-11-28: 3.375 g via INTRAVENOUS
  Filled 2015-11-28: qty 50

## 2015-11-28 MED ORDER — SUCCINYLCHOLINE CHLORIDE 20 MG/ML IJ SOLN
INTRAMUSCULAR | Status: AC | PRN
  Administered 2015-11-28: 150 mg via INTRAVENOUS

## 2015-11-28 MED ORDER — METHYLPREDNISOLONE SODIUM SUCC 125 MG IJ SOLR
125.0000 mg | Freq: Once | INTRAMUSCULAR | Status: DC
  Filled 2015-11-28: qty 2

## 2015-11-28 MED ORDER — PROPOFOL 1000 MG/100ML IV EMUL: 5.0000 ug/kg/min | INTRAVENOUS | Status: DC

## 2015-11-28 MED ORDER — PROPOFOL 1000 MG/100ML IV EMUL
INTRAVENOUS | Status: AC
  Filled 2015-11-28: qty 100

## 2015-11-28 MED ORDER — MIDAZOLAM HCL 5 MG/5ML IJ SOLN
5.0000 mg | Freq: Once | INTRAMUSCULAR | Status: AC
  Administered 2015-11-28: 5 mg via INTRAVENOUS

## 2015-11-28 MED ORDER — SODIUM CHLORIDE 0.9 % IV BOLUS (SEPSIS)
3000.0000 mL | Freq: Once | INTRAVENOUS | Status: AC
  Administered 2015-11-28: 3000 mL via INTRAVENOUS

## 2015-11-28 MED ORDER — MIDAZOLAM HCL 5 MG/5ML IJ SOLN
INTRAMUSCULAR | Status: AC
  Filled 2015-11-28: qty 5

## 2015-11-28 MED ORDER — MIDAZOLAM HCL 50 MG/10ML IJ SOLN
INTRAMUSCULAR | Status: AC
  Filled 2015-11-28: qty 1

## 2015-11-28 MED ORDER — ANTISEPTIC ORAL RINSE SOLUTION (CORINZ)
7.0000 mL | Freq: Four times a day (QID) | OROMUCOSAL | Status: DC
  Administered 2015-11-29 – 2015-12-05 (×26): 7 mL via OROMUCOSAL

## 2015-11-28 MED ORDER — PIPERACILLIN-TAZOBACTAM 3.375 G IVPB
3.3750 g | Freq: Three times a day (TID) | INTRAVENOUS | Status: DC
Start: 2015-11-29 — End: 2015-12-03
  Administered 2015-11-29 – 2015-12-03 (×13): 3.375 g via INTRAVENOUS
  Filled 2015-11-28 (×9): qty 50

## 2015-11-28 MED ORDER — PROPOFOL 1000 MG/100ML IV EMUL
10.0000 ug/kg/min | INTRAVENOUS | Status: DC
  Administered 2015-11-28: 10 ug/kg/min via INTRAVENOUS

## 2015-11-28 MED ORDER — IPRATROPIUM BROMIDE 0.02 % IN SOLN
0.5000 mg | Freq: Once | RESPIRATORY_TRACT | Status: AC
  Administered 2015-11-28: 0.5 mg via RESPIRATORY_TRACT
  Filled 2015-11-28: qty 2.5

## 2015-11-28 NOTE — ED Notes (Addendum)
Pt arrived to er on bipap due to resp distress, very little air movement noted, Dr Clayborne DanaMesner at bedside, ems reports that pt sob started during the day getting worse tonight, pt given 125 mg solumedrol prior to arrival in er, pulse ox on bipap in 70's on arrival to er,

## 2015-11-28 NOTE — H&P (Signed)
[facility] History and Physical  Adelaido Nicklaus ZOX:[id number] DOB: [birth year]    PCP:   Mechele Claude, MD   Chief Complaint: acute on chronic respiratory failure, in respiratory extremis, intubated upon arrival in the ED.   HPI: Logan West is an 82 y.o. male with hx of COPD, HLD, CAD, combined CHF (Dr Octaviano Batty), obesity, chronic afib who declined anticoagulation, hx of hematuria (cleared by urology for anticoagualtion if needed), presented to the ER via EMS in respiratory extremis, receiving nebs and Bipap en route, intubated immediately upon arrival to the ER.  In the ER, CXR showed possible  Multifocal PNA vs pul edema, lactic acid normal, hypotensive after intubation and profopol, ABG showed 7.20/90/ pOx= 262 on 100%. With serology showed normal renal Fx test, no leuckocytosis, and normal Hb.  He was given IV Solumedrol, and Van/Zosyn, and hospitalist was asked to admit him for acute on chronic hypercapneic respiratory failure with mulitifocal PNA and hypotension.   Rewiew of Systems:   Unable.   Past Medical History  Diagnosis Date  . Hypertension     x30 years  . Hyperlipidemia     x 7-8  . COPD (chronic obstructive pulmonary disease) (HCC)   . Obesity   . Chronic pain   . CHF (congestive heart failure) (HCC)   . Atrial fibrillation [facility])     Past Surgical History  Procedure Laterality Date  . Percutaneous pinning      right forearm fracture  . Fracture surgery      right forearm    Medications:  HOME MEDS: Prior to Admission medications   Medication Sig Start Date End Date Taking? Authorizing Provider  albuterol (PROVENTIL HFA;VENTOLIN HFA) 108 (90 BASE) MCG/ACT inhaler Inhale 2 puffs into the lungs every 6 (six) hours as needed for wheezing or shortness of breath.    Historical Provider, MD  albuterol (PROVENTIL) (2.5 MG/3ML) 0.083% nebulizer solution Take 3 mLs (2.5 mg total) by nebulization every 6 (six) hours as needed for wheezing or shortness of breath.  08/04/15   Erick Blinks, MD  amLODipine (NORVASC) 10 MG tablet Take 10 mg by mouth daily.    Historical Provider, MD  aspirin 81 MG EC tablet Take 81 mg by mouth daily. 09/01/15   Historical Provider, MD  budesonide-formoterol (SYMBICORT) 160-4.5 MCG/ACT inhaler Inhale 2 puffs into the lungs 2 (two) times daily. 05/28/13   Ileana Ladd, MD  diltiazem (CARDIZEM CD) 360 MG 24 hr capsule Take 1 capsule (360 mg total) by mouth daily. 09/09/15   Rollene Rotunda, MD  furosemide (LASIX) 20 MG tablet TAKE 1 TABLET (20 MG TOTAL) BY MOUTH DAILY. 11/04/15   Rollene Rotunda, MD  guaiFENesin (MUCINEX) 600 MG 12 hr tablet Take 2 tablets (1,200 mg total) by mouth 2 (two) times daily. 08/04/15   Erick Blinks, MD  LORazepam (ATIVAN) 1 MG tablet Take 1 tablet (1 mg total) by mouth every 8 (eight) hours as needed for anxiety. 08/17/15   Mechele Claude, MD  metoprolol (LOPRESSOR) 50 MG tablet TAKE 1 TABLET (50 MG TOTAL) BY MOUTH DAILY. 09/28/15   Mechele Claude, MD  pravastatin (PRAVACHOL) 40 MG tablet TAKE 1 TABLET (40 MG TOTAL) BY MOUTH DAILY. 11/12/15   Mechele Claude, MD  tamsulosin (FLOMAX) 0.4 MG CAPS capsule Take 1 capsule by mouth daily. 09/09/15   Historical Provider, MD  traMADol (ULTRAM) 50 MG tablet TAKE 1 TABLET BY MOUTH EVERY eight HOURS AS NEEDED FOR MODERATE PAIN 08/17/15   Mechele Claude, MD  Allergies:  No Known Allergies  Social History:   reports that he quit smoking about 24 years ago. His smoking use included Cigarettes. He has a 62.5 pack-year smoking history. He has never used smokeless tobacco. His alcohol and drug histories are not on file.  Family History: Family History  Problem Relation Age of Onset  . Cancer Mother   . Asthma Father   . Heart attack Brother     same mother.  MI in his 6620s  . Coronary artery disease Other      Physical Exam: Filed Vitals:   11/28/15 2303 11/28/15 2306 11/28/15 2315 11/28/15 2326  BP: 78/46 99/53 99/55    Pulse: 63 66 74   Temp:    94.6 F  (34.8 C)  TempSrc:    Rectal  Resp: 24 18 11    Height:      Weight:      SpO2: 98% 99% 100%    Blood pressure 99/55, pulse 74, temperature 94.6 F (34.8 C), temperature source Rectal, resp. rate 11, height 5\' 10"  (1.778 m), weight 108.41 kg (239 lb), SpO2 100 %.  GEN: intubated and sedated HEENT: Mucous membranes pink and anicteric;  EOM intact; no cervical lymphadenopathy nor thyromegaly or carotid bruit; no JVD; There were no stridor. Neck is very supple. Breasts:: Not examined CHEST WALL: No tenderness CHEST: mechanical intubation.  HEART: Regular rate and rhythm.  There are no murmur, rub, or gallops.   BACK: No kyphosis or scoliosis; no CVA tenderness ABDOMEN: soft and non-tender; no masses, no organomegaly, normal abdominal bowel sounds; no pannus; no intertriginous candida. There is no rebound and no distention. Rectal Exam: Not done EXTREMITIES: No bone or joint deformity; age-appropriate arthropathy of the hands and knees; no edema; no ulcerations.  There is no calf tenderness. Genitalia: not examined PULSES: 2+ and symmetric SKIN: Normal hydration no rash or ulceration CNS: intubated and sedated.   Labs on Admission:  Basic Metabolic Panel:  Recent Labs Lab 11/28/15 2125  NA 142  K 5.0  CL 98*  CO2 36*  GLUCOSE 181*  BUN 19  CREATININE 1.12  CALCIUM 9.1   Liver Function Tests:  Recent Labs Lab 11/28/15 2125  AST 18  ALT 13*  ALKPHOS 75  BILITOT 1.6*  PROT 6.8  ALBUMIN 3.4*   CBC:  Recent Labs Lab 11/28/15 2125  WBC 7.3  NEUTROABS 6.5  HGB 12.6*  HCT 40.2  MCV 97.6  PLT 159   Cardiac Enzymes:  Recent Labs Lab 11/28/15 2125  TROPONINI <0.03    CBG:  Recent Labs Lab 11/28/15 2154  GLUCAP 157*     Radiological Exams on Admission: Dg Chest Portable 1 View  11/28/2015  CLINICAL DATA:  Respiratory distress status post intubation. EXAM: PORTABLE CHEST 1 VIEW COMPARISON:  09/08/2015 FINDINGS: Endotracheal tube overlies the tracheal  air column and terminates less than 1 cm superior to the carina. The cardiac silhouette is normal. Mediastinal contours appear intact considering portable technique. There is no evidence of pleural effusion or pneumothorax. There are bilateral lower lobe predominant patchy airspace opacities Osseous structures are without acute abnormality. Soft tissues are grossly normal. IMPRESSION: Status post intubation with endotracheal tube terminating less than a cm from the carina. Bilateral patchy airspace consolidation with lower lobe predominance. These may represent developing pulmonary edema or multifocal pneumonia. Electronically Signed   By: Ted Mcalpineobrinka  Dimitrova M.D.   On: 11/28/2015 21:43    EKG: Independently reviewed.    Assessment/Plan Present on Admission:  .  Respiratory failure (HCC) . Obesity . Atrial fibrillation (HCC) . Chronic pain syndrome . Chronic combined systolic and diastolic congestive heart failure (HCC) . Respiratory failure with hypercapnia (HCC)  PLAN:  RESPIRATORY FAILURE:  Continue with mechanical ventilation.  Adjust vent setting, remove PEEP, and recheck ABG.  Continue with IV Solumedrol.   Continue IV Antibiotics.    PNA:  Will need nebs, IV antibiotics. And continue with steroids. Possible sepsis with hypothermia.  BC done.   Lactic acid OK.   AFIB:  He has declined anticoagulation.  Will keep rate controlled.  HYPOTENSION:  Will change profopol to IV Versed.  Take off Peep.  IVF bolus, and pressor if required.    Other plans as per orders. Code Status: FULL CODE>    Houston Siren, MD. FACP [facility] Pager [phone] 7pm to 7am.  11/28/2015, 11:35 PM

## 2015-11-28 NOTE — ED Notes (Signed)
CRITICAL VALUE ALERT  Critical value received:  Abg, ph 7.201, PCO2 90.1  Date of notification:  11/28/2015 Time of notification:  22:08  Critical result read back to resp therapy   Nurse who received alert:  Youlanda MightyMelinda Demaris Leavell RN   MD notified (1st page):  Dr Clayborne DanaMesner  Time of first page:  22:07 MD notified (2nd page):  Time of second page:  Responding MD:  Dr Clayborne DanaMesner  Time MD responded: 22:08

## 2015-11-28 NOTE — ED Notes (Signed)
All IV sites running well- No signs of infiltration at this time

## 2015-11-28 NOTE — ED Notes (Signed)
Dr Dierdre Searlesli in room with pt at this time - Aware of BP's

## 2015-11-28 NOTE — ED Notes (Signed)
Propofol stopped at this time per Dr Stevenson ClinchLi's instructions - Nursing supervisor in room and made aware of need for versed drip

## 2015-11-28 NOTE — Progress Notes (Addendum)
ANTIBIOTIC CONSULT NOTE - INITIAL  Pharmacy Consult for vancomycin and zosyn Indication: pneumonia  No Known Allergies  Patient Measurements: Height: 5\' 10"  (177.8 cm) Weight: 239 lb (108.41 kg) IBW/kg (Calculated) : 73   Vital Signs: Temp: 95.5 F (35.3 C) (01/07 2149) Temp Source: Rectal (01/07 2149) BP: 110/89 mmHg (01/07 2130) Pulse Rate: 73 (01/07 2130) Intake/Output from previous day:   Intake/Output from this shift:    Labs:  Recent Labs  11/28/15 2125  WBC 7.3  HGB 12.6*  PLT 159   CrCl cannot be calculated (Patient has no serum creatinine result on file.). No results for input(s): VANCOTROUGH, VANCOPEAK, VANCORANDOM, GENTTROUGH, GENTPEAK, GENTRANDOM, TOBRATROUGH, TOBRAPEAK, TOBRARND, AMIKACINPEAK, AMIKACINTROU, AMIKACIN in the last 72 hours.   Microbiology: Recent Results (from the past 720 hour(s))  Blood culture (routine x 2)     Status: None (Preliminary result)   Collection Time: 11/28/15  9:25 PM  Result Value Ref Range Status   Specimen Description LEFT ANTECUBITAL  Final   Special Requests BOTTLES DRAWN AEROBIC ONLY 6CC ONLY  Final   Culture PENDING  Incomplete   Report Status PENDING  Incomplete    Medical History: Past Medical History  Diagnosis Date  . Hypertension     x30 years  . Hyperlipidemia     x 7-8  . COPD (chronic obstructive pulmonary disease) (HCC)   . Obesity   . Chronic pain   . CHF (congestive heart failure) (HCC)   . Atrial fibrillation (HCC)     Medications:  See medication history Assessment: 82 yo man to start broad spectrum antibiotics for PNA  Goal of Therapy:  Vancomycin trough level 15-20 mcg/ml  Plan:  Vancomycin 2 gm total loading dose Zosyn 3.375 gm IV q8 hours F/u renal function for additional vanc dosing F/u cultures and clinical course  Thanks for allowing pharmacy to be a part of this patient's care.  Talbert CageLora Lilyanne Mcquown, PharmD Clinical Pharmacist  11/28/2015,10:04 PM   Addum:  Cont vanc 750 mg IV  q12 hours Check vanc trough when appropriate

## 2015-11-28 NOTE — ED Notes (Signed)
Dr Conley RollsLe aware of rectal temp,

## 2015-11-28 NOTE — Code Documentation (Signed)
Pt intubated by Dr Clayborne DanaMesner, tolerated well, resp remains at bedside, portable chest xray performed, positive color change with co2 detector, positive bilateral breath sounds noted by Marlis EdelsonEmma RN

## 2015-11-28 NOTE — ED Provider Notes (Signed)
CSN: [id number]     Arrival date & time 11/28/15  2107 History   First MD Initiated Contact with Patient 11/28/15 2131     Chief Complaint  Patient presents with  . Respiratory Distress     (Consider location/radiation/quality/duration/timing/severity/associated sxs/prior Treatment) Patient is a 82 y.o. male presenting with shortness of breath.  Shortness of Breath Severity:  Severe Onset quality:  Unable to specify Timing:  Unable to specify Progression:  Unable to specify Chronicity:  New   Past Medical History  Diagnosis Date  . Hypertension     x30 years  . Hyperlipidemia     x 7-8  . COPD (chronic obstructive pulmonary disease) (HCC)   . Obesity   . Chronic pain   . CHF (congestive heart failure) (HCC)   . Atrial fibrillation [facility])    Past Surgical History  Procedure Laterality Date  . Percutaneous pinning      right forearm fracture  . Fracture surgery      right forearm   Family History  Problem Relation Age of Onset  . Cancer Mother   . Asthma Father   . Heart attack Brother     same mother.  MI in his 31s  . Coronary artery disease Other    Social History  Substance Use Topics  . Smoking status: Former Smoker -- 2.50 packs/day for 25 years    Types: Cigarettes    Quit date: 02/23/1991  . Smokeless tobacco: Never Used  . Alcohol Use: None    Review of Systems  Unable to perform ROS: Patient unresponsive  Respiratory: Positive for shortness of breath.       Allergies  Review of patient's allergies indicates no known allergies.  Home Medications   Prior to Admission medications   Medication Sig Start Date End Date Taking? Authorizing Provider  albuterol (PROVENTIL HFA;VENTOLIN HFA) 108 (90 BASE) MCG/ACT inhaler Inhale 2 puffs into the lungs every 6 (six) hours as needed for wheezing or shortness of breath.    Historical Provider, MD  albuterol (PROVENTIL) (2.5 MG/3ML) 0.083% nebulizer solution Take 3 mLs (2.5 mg total) by nebulization every 6  (six) hours as needed for wheezing or shortness of breath. 08/04/15   Erick Blinks, MD  amLODipine (NORVASC) 10 MG tablet Take 10 mg by mouth daily.    Historical Provider, MD  aspirin 81 MG EC tablet Take 81 mg by mouth daily. 09/01/15   Historical Provider, MD  budesonide-formoterol (SYMBICORT) 160-4.5 MCG/ACT inhaler Inhale 2 puffs into the lungs 2 (two) times daily. 05/28/13   Ileana Ladd, MD  diltiazem (CARDIZEM CD) 360 MG 24 hr capsule Take 1 capsule (360 mg total) by mouth daily. 09/09/15   Rollene Rotunda, MD  furosemide (LASIX) 20 MG tablet TAKE 1 TABLET (20 MG TOTAL) BY MOUTH DAILY. 11/04/15   Rollene Rotunda, MD  guaiFENesin (MUCINEX) 600 MG 12 hr tablet Take 2 tablets (1,200 mg total) by mouth 2 (two) times daily. 08/04/15   Erick Blinks, MD  LORazepam (ATIVAN) 1 MG tablet Take 1 tablet (1 mg total) by mouth every 8 (eight) hours as needed for anxiety. 08/17/15   Mechele Claude, MD  metoprolol (LOPRESSOR) 50 MG tablet TAKE 1 TABLET (50 MG TOTAL) BY MOUTH DAILY. 09/28/15   Mechele Claude, MD  pravastatin (PRAVACHOL) 40 MG tablet TAKE 1 TABLET (40 MG TOTAL) BY MOUTH DAILY. 11/12/15   Mechele Claude, MD  tamsulosin (FLOMAX) 0.4 MG CAPS capsule Take 1 capsule by mouth daily. 09/09/15   Historical  Provider, MD  traMADol (ULTRAM) 50 MG tablet TAKE 1 TABLET BY MOUTH EVERY eight HOURS AS NEEDED FOR MODERATE PAIN 08/17/15   Mechele Claude, MD   BP 98/44 mmHg  Pulse 72  Temp(Src) 95.5 F (35.3 C) (Rectal)  Resp 14  Ht 5\' 10"  (1.778 m)  Wt 239 lb (108.41 kg)  BMI 34.29 kg/m2  SpO2 100% Physical Exam  Constitutional: He appears well-developed and well-nourished. He appears distressed.  HENT:  Head: Normocephalic and atraumatic.  Eyes: Conjunctivae are normal. Pupils are equal, round, and reactive to light.  Neck: Normal range of motion.  Cardiovascular: Regular rhythm.  Tachycardia present.   Pulmonary/Chest: He is in respiratory distress. He has decreased breath sounds (barely audible on  BiPaP).  Abdominal: He exhibits no distension. There is no rebound.  Musculoskeletal: Normal range of motion.  Neurological: GCS eye subscore is 2. GCS verbal subscore is 1. GCS motor subscore is 1.  Skin: Skin is dry.  Nursing note and vitals reviewed.   ED Course  .Intubation Date/Time: 11/28/2015 10:47 PM Performed by: Marily Memos Authorized by: Marily Memos Consent: The procedure was performed in an emergent situation. Required items: required blood products, implants, devices, and special equipment available Indications: respiratory failure,  hypoxemia and  airway protection Intubation method: direct Patient status: paralyzed (RSI) Preoxygenation: BVM Sedatives: etomidate Paralytic: succinylcholine Laryngoscope size: Mac 3 Tube size: 7.5 mm Tube type: cuffed Number of attempts: 1 Cords visualized: yes Cuff inflated: yes ETT to lip: 23 cm Tube secured with: ETT holder Chest x-ray interpreted by me. Chest x-ray findings: endotracheal tube in appropriate position Patient tolerance: Patient tolerated the procedure well with no immediate complications   CRITICAL CARE Performed by: Marily Memos   Total critical care time: 30 minutes Critical care time was exclusive of separately billable procedures and treating other patients. Critical care was necessary to treat or prevent imminent or life-threatening deterioration. Critical care was time spent personally by me on the following activities: development of treatment plan with patient and/or surrogate as well as nursing, discussions with consultants, evaluation of patient's response to treatment, examination of patient, obtaining history from patient or surrogate, ordering and performing treatments and interventions, ordering and review of laboratory studies, ordering and review of radiographic studies, pulse oximetry and re-evaluation of patient's condition.  Labs Review Labs Reviewed  COMPREHENSIVE METABOLIC PANEL -  Abnormal; Notable for the following:    Chloride 98 (*)    CO2 36 (*)    Glucose, Bld 181 (*)    Albumin 3.4 (*)    ALT 13 (*)    Total Bilirubin 1.6 (*)    All other components within normal limits  CBC WITH DIFFERENTIAL/PLATELET - Abnormal; Notable for the following:    RBC 4.12 (*)    Hemoglobin 12.6 (*)    Lymphs Abs 0.5 (*)    All other components within normal limits  BRAIN NATRIURETIC PEPTIDE - Abnormal; Notable for the following:    B Natriuretic Peptide 128.0 (*)    All other components within normal limits  URINALYSIS, ROUTINE W REFLEX MICROSCOPIC (NOT AT [facility]) - Abnormal; Notable for the following:    Specific Gravity, Urine >1.030 (*)    Bilirubin Urine SMALL (*)    All other components within normal limits  BLOOD GAS, ARTERIAL - Abnormal; Notable for the following:    pH, Arterial 7.201 (*)    pCO2 arterial 90.1 (*)    pO2, Arterial 262.0 (*)    Bicarbonate 27.3 (*)  Acid-Base Excess 6.3 (*)    All other components within normal limits  CULTURE, BLOOD (ROUTINE X 2)  CULTURE, BLOOD (ROUTINE X 2)  TROPONIN I  LACTIC ACID, PLASMA  TRIGLYCERIDES  LACTIC ACID, PLASMA    Imaging Review Dg Chest Portable 1 View  11/28/2015  CLINICAL DATA:  Respiratory distress status post intubation. EXAM: PORTABLE CHEST 1 VIEW COMPARISON:  09/08/2015 FINDINGS: Endotracheal tube overlies the tracheal air column and terminates less than 1 cm superior to the carina. The cardiac silhouette is normal. Mediastinal contours appear intact considering portable technique. There is no evidence of pleural effusion or pneumothorax. There are bilateral lower lobe predominant patchy airspace opacities Osseous structures are without acute abnormality. Soft tissues are grossly normal. IMPRESSION: Status post intubation with endotracheal tube terminating less than a cm from the carina. Bilateral patchy airspace consolidation with lower lobe predominance. These may represent developing pulmonary edema or  multifocal pneumonia. Electronically Signed   By: Ted Mcalpineobrinka  Dimitrova M.D.   On: 11/28/2015 21:43   I have personally reviewed and evaluated these images and lab results as part of my medical decision-making.   EKG Interpretation   Date/Time:  Saturday November 28 2015 21:14:20 EST Ventricular Rate:  84 PR Interval:    QRS Duration: 91 QT Interval:  367 QTC Calculation: 434 R Axis:   92 Text Interpretation:  Atrial fibrillation Right axis deviation Low  voltage, extremity leads Probable anteroseptal infarct, old Confirmed by  [facility] MD, Barbara CowerJASON [phone] on 11/28/2015 9:36:46 PM      MDM   Final diagnoses:  Acute respiratory failure with hypoxia (HCC)  Community acquired pneumonia  Sepsis, due to unspecified organism [facility](HCC)    Per EMS report patient started feeling ill earlier today with source of breath and cough progressively worsen however last 2 hours had significant respiratory distress and altered mental status so EMS was called. EMSs arrival patient was hypoxic and wheezing but still alert and oriented. In route here patient had albuterol but subsequently deteriorated and became hypoxic started on BiPAP and altered mental status started to decrease as well. On arrival here patient did not have any breath sounds while on BiPAP sats were only 74%. Bilateral lower show any pitting edema. Slightly tachycardic however no hypotension.  Initial concern was for pneumonia versus COPD exacerbation versus CHF exacerbation versus ACS versus PE. Patient was intubated for hypoxic respiratory failure and airway protection as he only had a GCS of 4. His chest x-ray afterwards appeared to be pneumonia in the right lower lobe so broad-spectrum antibiotics, fluids, lactic acid and blood cultures were drawn before antibiotic. His initial blood gas showed hypoxia and respiratory acidosis so patient ventilatory rate was increased from 16-22. I discussed case with hospitalist who will admit the patient for  further management.    Marily MemosJason Sheikh Leverich, MD 12/01/15 [phone]

## 2015-11-28 NOTE — ED Notes (Signed)
Family/daughter called- Informed her of pt's need for admission to ICU - Stated that she would call back later to find out where pt was going and would inform her mother .

## 2015-11-28 NOTE — ED Notes (Signed)
BairHugger applied at this time.

## 2015-11-29 ENCOUNTER — Encounter (HOSPITAL_COMMUNITY): Payer: Self-pay

## 2015-11-29 ENCOUNTER — Inpatient Hospital Stay (HOSPITAL_COMMUNITY): Payer: Medicare Other

## 2015-11-29 DIAGNOSIS — T50905A Adverse effect of unspecified drugs, medicaments and biological substances, initial encounter: Secondary | ICD-10-CM | POA: Diagnosis present

## 2015-11-29 DIAGNOSIS — T68XXXA Hypothermia, initial encounter: Secondary | ICD-10-CM | POA: Diagnosis present

## 2015-11-29 DIAGNOSIS — J449 Chronic obstructive pulmonary disease, unspecified: Secondary | ICD-10-CM

## 2015-11-29 DIAGNOSIS — J189 Pneumonia, unspecified organism: Secondary | ICD-10-CM | POA: Diagnosis present

## 2015-11-29 DIAGNOSIS — A419 Sepsis, unspecified organism: Secondary | ICD-10-CM | POA: Diagnosis present

## 2015-11-29 DIAGNOSIS — R739 Hyperglycemia, unspecified: Secondary | ICD-10-CM

## 2015-11-29 LAB — GLUCOSE, CAPILLARY
GLUCOSE-CAPILLARY: 175 mg/dL — AB (ref 65–99)
GLUCOSE-CAPILLARY: 129 mg/dL — AB (ref 65–99)
GLUCOSE-CAPILLARY: 117 mg/dL — AB (ref 65–99)
Glucose-Capillary: 177 mg/dL — ABNORMAL HIGH (ref 65–99)
Glucose-Capillary: 133 mg/dL — ABNORMAL HIGH (ref 65–99)
Glucose-Capillary: 139 mg/dL — ABNORMAL HIGH (ref 65–99)

## 2015-11-29 LAB — BLOOD GAS, ARTERIAL
ACID-BASE EXCESS: 6.6 mmol/L — AB (ref 0.0–2.0)
Acid-Base Excess: 2.6 mmol/L — ABNORMAL HIGH (ref 0.0–2.0)
Acid-Base Excess: 8.8 mmol/L — ABNORMAL HIGH (ref 0.0–2.0)
BICARBONATE: 29.1 meq/L — AB (ref 20.0–24.0)
BICARBONATE: 33 meq/L — AB (ref 20.0–24.0)
Bicarbonate: 25.4 mEq/L — ABNORMAL HIGH (ref 20.0–24.0)
DRAWN BY: 21310
DRAWN BY: 221791
DRAWN BY: 221791
FIO2: 50
FIO2: 60
FIO2: 60
LHR: 20 {breaths}/min
MECHVT: 550 mL
MECHVT: 600 mL
MECHVT: 600 mL
O2 SAT: 75.7 %
O2 SAT: 98.2 %
O2 Saturation: 95.6 %
PATIENT TEMPERATURE: 37
PATIENT TEMPERATURE: 37
PCO2 ART: 33.3 mmHg — AB (ref 35.0–45.0)
PEEP/CPAP: 0 cmH2O
PEEP: 5 cmH2O
PH ART: 7.298 — AB (ref 7.350–7.450)
PH ART: 7.393 (ref 7.350–7.450)
PH ART: 7.583 — AB (ref 7.350–7.450)
PO2 ART: 40.4 mmHg — AB (ref 80.0–100.0)
RATE: 20 resp/min
RATE: 20 resp/min
TCO2: 12.7 mmol/L (ref 0–100)
TCO2: 16.3 mmol/L (ref 0–100)
TCO2: 17.2 mmol/L (ref 0–100)
pCO2 arterial: 52.7 mmHg — ABNORMAL HIGH (ref 35.0–45.0)
pCO2 arterial: 60 mmHg (ref 35.0–45.0)
pO2, Arterial: 80.2 mmHg (ref 80.0–100.0)
pO2, Arterial: 87.8 mmHg (ref 80.0–100.0)

## 2015-11-29 LAB — TROPONIN I

## 2015-11-29 LAB — MRSA PCR SCREENING: MRSA BY PCR: NEGATIVE

## 2015-11-29 LAB — LACTIC ACID, PLASMA: Lactic Acid, Venous: 3.7 mmol/L (ref 0.5–2.0)

## 2015-11-29 LAB — TSH: TSH: 0.75 u[IU]/mL (ref 0.350–4.500)

## 2015-11-29 MED ORDER — MIDAZOLAM HCL 5 MG/5ML IJ SOLN
INTRAMUSCULAR | Status: AC
  Filled 2015-11-29: qty 5

## 2015-11-29 MED ORDER — PROPOFOL 1000 MG/100ML IV EMUL
5.0000 ug/kg/min | INTRAVENOUS | Status: DC
  Administered 2015-11-29 (×2): 30 ug/kg/min via INTRAVENOUS
  Administered 2015-11-29: 5 ug/kg/min via INTRAVENOUS
  Administered 2015-11-30 – 2015-12-02 (×17): 40 ug/kg/min via INTRAVENOUS
  Administered 2015-12-03: 10 ug/kg/min via INTRAVENOUS
  Administered 2015-12-03: 40 ug/kg/min via INTRAVENOUS
  Administered 2015-12-03: 30 ug/kg/min via INTRAVENOUS
  Administered 2015-12-04: 15 ug/kg/min via INTRAVENOUS
  Filled 2015-11-29 (×7): qty 100
  Filled 2015-11-29: qty 200
  Filled 2015-11-29 (×18): qty 100

## 2015-11-29 MED ORDER — ALBUTEROL SULFATE (2.5 MG/3ML) 0.083% IN NEBU
2.5000 mg | INHALATION_SOLUTION | Freq: Four times a day (QID) | RESPIRATORY_TRACT | Status: DC
  Administered 2015-11-29 – 2015-12-07 (×35): 2.5 mg via RESPIRATORY_TRACT
  Filled 2015-11-29 (×36): qty 3

## 2015-11-29 MED ORDER — METHYLPREDNISOLONE SODIUM SUCC 40 MG IJ SOLR
40.0000 mg | Freq: Four times a day (QID) | INTRAMUSCULAR | Status: DC
  Administered 2015-11-29 – 2015-12-02 (×14): 40 mg via INTRAVENOUS
  Filled 2015-11-29 (×14): qty 1

## 2015-11-29 MED ORDER — ALBUTEROL SULFATE (2.5 MG/3ML) 0.083% IN NEBU
2.5000 mg | INHALATION_SOLUTION | RESPIRATORY_TRACT | Status: DC | PRN
  Filled 2015-11-29: qty 3

## 2015-11-29 MED ORDER — INSULIN GLARGINE 100 UNIT/ML ~~LOC~~ SOLN
7.0000 [IU] | Freq: Two times a day (BID) | SUBCUTANEOUS | Status: DC
  Administered 2015-11-29 – 2015-12-06 (×16): 7 [IU] via SUBCUTANEOUS
  Filled 2015-11-29 (×19): qty 0.07

## 2015-11-29 MED ORDER — MIDAZOLAM HCL 50 MG/10ML IJ SOLN
INTRAMUSCULAR | Status: AC
  Filled 2015-11-29: qty 1

## 2015-11-29 MED ORDER — VANCOMYCIN HCL IN DEXTROSE 750-5 MG/150ML-% IV SOLN
750.0000 mg | Freq: Two times a day (BID) | INTRAVENOUS | Status: DC
  Administered 2015-11-29 – 2015-12-02 (×7): 750 mg via INTRAVENOUS
  Filled 2015-11-29 (×9): qty 150

## 2015-11-29 MED ORDER — METOPROLOL TARTRATE 1 MG/ML IV SOLN
5.0000 mg | INTRAVENOUS | Status: DC | PRN
  Filled 2015-11-29: qty 5

## 2015-11-29 MED ORDER — FENTANYL CITRATE (PF) 100 MCG/2ML IJ SOLN: 12.5000 ug | INTRAMUSCULAR | Status: DC | PRN

## 2015-11-29 MED ORDER — INSULIN ASPART 100 UNIT/ML ~~LOC~~ SOLN
0.0000 [IU] | SUBCUTANEOUS | Status: DC
  Administered 2015-11-29 (×2): 2 [IU] via SUBCUTANEOUS
  Administered 2015-11-29: 3 [IU] via SUBCUTANEOUS
  Administered 2015-11-29: 2 [IU] via SUBCUTANEOUS
  Administered 2015-11-29: 3 [IU] via SUBCUTANEOUS
  Administered 2015-11-30: 2 [IU] via SUBCUTANEOUS
  Administered 2015-11-30: 3 [IU] via SUBCUTANEOUS
  Administered 2015-11-30: 2 [IU] via SUBCUTANEOUS
  Administered 2015-11-30: 3 [IU] via SUBCUTANEOUS
  Administered 2015-11-30 – 2015-12-08 (×8): 2 [IU] via SUBCUTANEOUS

## 2015-11-29 MED ORDER — ASPIRIN EC 81 MG PO TBEC
81.0000 mg | DELAYED_RELEASE_TABLET | Freq: Every day | ORAL | Status: DC
  Administered 2015-12-04 – 2015-12-08 (×5): 81 mg via ORAL
  Filled 2015-11-29 (×5): qty 1

## 2015-11-29 MED ORDER — DEXTROSE-NACL 5-0.9 % IV SOLN
INTRAVENOUS | Status: DC
  Administered 2015-11-29 (×2): via INTRAVENOUS

## 2015-11-29 MED ORDER — HEPARIN SODIUM (PORCINE) 5000 UNIT/ML IJ SOLN
5000.0000 [IU] | Freq: Three times a day (TID) | INTRAMUSCULAR | Status: DC
  Administered 2015-11-29 – 2015-12-01 (×8): 5000 [IU] via SUBCUTANEOUS
  Filled 2015-11-29 (×7): qty 1

## 2015-11-29 MED ORDER — SODIUM CHLORIDE 0.9 % IJ SOLN
3.0000 mL | Freq: Two times a day (BID) | INTRAMUSCULAR | Status: DC
  Administered 2015-11-29 – 2015-12-07 (×14): 3 mL via INTRAVENOUS

## 2015-11-29 MED ORDER — PIPERACILLIN-TAZOBACTAM 3.375 G IVPB 30 MIN: 3.3750 g | Freq: Three times a day (TID) | INTRAVENOUS | Status: DC

## 2015-11-29 NOTE — Progress Notes (Signed)
Will begin decreasing rate to 16 for high PH 7.583 , pCO2 33.3 , pO2 87.8

## 2015-11-29 NOTE — Progress Notes (Signed)
[facility] PROGRESS NOTE  Meredith StaggersHorace Billey UJW:[id number]:[id number] DOB: 1934/10/19 DOA: 11/28/2015 PCP: Mechele ClaudeSTACKS,WARREN, MD   Code Status: Full code Family Communication: family not available  Disposition Plan: discharge when clinically appropriate   Consultants:  Pulmonology, pending  Procedures:  Intubation and mechanical ventilation started in the ED  Antibiotics:  Zosyn 11/29/15 >>  Vancomycin 11/29/15 >>  HPI/Subjective: Nursing reports some agitation overnight as the patient was trying to pull out his ET tube. When I entered the room, he was alert and had a body tremor.  Objective: Filed Vitals:   11/29/15 0730 11/29/15 0745  BP: 119/61 128/58  Pulse: 76 72  Temp:  99.8 F (37.7 C)  Resp: 20 20   oxygen saturation on the ventilator 97%   Intake/Output Summary (Last 24 hours) at 11/29/15 0818 Last data filed at 11/29/15 0500  Gross per 24 hour  Intake 316.27 ml  Output    200 ml  Net 116.27 ml   Filed Weights   11/28/15 2140 11/29/15 0045 11/29/15 0500  Weight: 108.41 kg (239 lb) 111.7 kg (246 lb 4.1 oz) 111.2 kg (245 lb 2.4 oz)    Exam:   General:  morbidly obese 82 year old Caucasian man on the ventilator. He has a mild whole-body tremor, but is alert to his name and tries to talk.  Cardiovascular: distant irregular-irregular difficult to auscultate for murmurs rubs or gallops.  Respiratory: bilateral/bibasilar crackles; mild rhonchi. Mechanically ventilated.  Abdomen: morbidly obese, hypoactive bowel sounds, soft, no obvious tenderness.  Musculoskeletal/extremities: No pedal edema. No acute hot red joints.  Neurologic: The patient appears somewhat ages and agitated. He tries multiple times to talk. He moves all of his extremities spontaneously. Cranial nerves II through XII appear grossly intact.   Data Reviewed: Basic Metabolic Panel:  Recent Labs Lab 11/28/15 2125  NA 142  K 5.0  CL 98*  CO2 36*  GLUCOSE 181*  BUN 19  CREATININE 1.12  CALCIUM  9.1   Liver Function Tests:  Recent Labs Lab 11/28/15 2125  AST 18  ALT 13*  ALKPHOS 75  BILITOT 1.6*  PROT 6.8  ALBUMIN 3.4*   No results for input(s): LIPASE, AMYLASE in the last 168 hours. No results for input(s): AMMONIA in the last 168 hours. CBC:  Recent Labs Lab 11/28/15 2125  WBC 7.3  NEUTROABS 6.5  HGB 12.6*  HCT 40.2  MCV 97.6  PLT 159   Cardiac Enzymes:  Recent Labs Lab 11/28/15 2125 11/29/15 0121 11/29/15 0643  TROPONINI <0.03 <0.03 <0.03   BNP (last 3 results)  Recent Labs  08/27/15 2301 11/04/15 1410 11/28/15 2125  BNP 160.6* 126.5* 128.0*    ProBNP (last 3 results) No results for input(s): PROBNP in the last 8760 hours.  CBG:  Recent Labs Lab 11/28/15 2154 11/29/15 0102 11/29/15 0422 11/29/15 0741  GLUCAP 157* 177* 175* 129*    Recent Results (from the past 240 hour(s))  Blood culture (routine x 2)     Status: None (Preliminary result)   Collection Time: 11/28/15  9:25 PM  Result Value Ref Range Status   Specimen Description LEFT ANTECUBITAL  Final   Special Requests BOTTLES DRAWN AEROBIC ONLY 6CC ONLY  Final   Culture PENDING  Incomplete   Report Status PENDING  Incomplete  Blood culture (routine x 2)     Status: None (Preliminary result)   Collection Time: 11/28/15  9:25 PM  Result Value Ref Range Status   Specimen Description LEFT ANTECUBITAL  Final   Special Requests  BOTTLES DRAWN AEROBIC ONLY 6CC ONLY  Final   Culture PENDING  Incomplete   Report Status PENDING  Incomplete  MRSA PCR Screening     Status: None   Collection Time: 11/29/15 12:46 AM  Result Value Ref Range Status   MRSA by PCR NEGATIVE NEGATIVE Final    Comment:        The GeneXpert MRSA Assay (FDA approved for NASAL specimens only), is one component of a comprehensive MRSA colonization surveillance program. It is not intended to diagnose MRSA infection nor to guide or monitor treatment for MRSA infections.      Studies: Dg Chest Portable 1  View  11/28/2015  CLINICAL DATA:  Respiratory distress status post intubation. EXAM: PORTABLE CHEST 1 VIEW COMPARISON:  09/08/2015 FINDINGS: Endotracheal tube overlies the tracheal air column and terminates less than 1 cm superior to the carina. The cardiac silhouette is normal. Mediastinal contours appear intact considering portable technique. There is no evidence of pleural effusion or pneumothorax. There are bilateral lower lobe predominant patchy airspace opacities Osseous structures are without acute abnormality. Soft tissues are grossly normal. IMPRESSION: Status post intubation with endotracheal tube terminating less than a cm from the carina. Bilateral patchy airspace consolidation with lower lobe predominance. These may represent developing pulmonary edema or multifocal pneumonia. Electronically Signed   By: Ted Mcalpine M.D.   On: 11/28/2015 21:43    Scheduled Meds: . albuterol  2.5 mg Nebulization Q6H  . antiseptic oral rinse  7 mL Mouth Rinse QID  . aspirin EC  81 mg Oral Daily  . chlorhexidine gluconate  15 mL Mouth Rinse BID  . heparin  5,000 Units Subcutaneous 3 times per day  . insulin aspart  0-15 Units Subcutaneous 6 times per day  . methylPREDNISolone (SOLU-MEDROL) injection  125 mg Intravenous Once  . methylPREDNISolone (SOLU-MEDROL) injection  40 mg Intravenous Q6H  . piperacillin-tazobactam (ZOSYN)  IV  3.375 g Intravenous Q8H  . sodium chloride  3 mL Intravenous Q12H  . vancomycin  750 mg Intravenous Q12H   Continuous Infusions: . dextrose 5 % and 0.9% NaCl 75 mL/hr at 11/29/15 0500  . midazolam (VERSED) infusion 1 mg/hr (11/29/15 0641)   Assessment and plan: Principal Problem:   Respiratory failure with hypercapnia (HCC) Active Problems:   Bilateral pneumonia   Sepsis (HCC)   COPD mixed type (HCC)   Hypothermia   Obesity   Chronic pain syndrome   Chronic combined systolic and diastolic congestive heart failure (HCC)   Chronic atrial fibrillation (HCC)    Hyperglycemia, drug-induced    1. Acute respiratory failure with hypercapnia and respiratory acidosis, secondary to bilateral pneumonia. -The patient's chest x-ray revealed bilateral patches airspace consolidation, likely from multifocal pneumonia. Pulmonary edema was considered, but the patient's BNP is only modestly elevated. He was hypothermic on admission with temperature of 95.5. He was intubated on arrival to the ED. initial ABG on the ventilator revealed a pH of 7.2, PCO2 of 90, and PO2 of 262. -We'll continue Zosyn, mitomycin, and Solu-Medrol as ordered. Will continue albuterol nebulizer. Will add Atrovent nebulizer. -Pulmonologist, Dr. Juanetta Gosling has been consulted. Will defer ventilator ventilator changes to him. -Due to the patient's agitation, will start a propofol infusion.  Early sepsis with lactic acidosis, secondary to bilateral pneumonia. The patient's blood pressure and lactic acid were initially within normal limits. However, he was hypothermic on admission. His lactic acid increased to 3.7. He did come hypotensive following intubation, but that may have been from the mechanics of  intubation. Blood cultures were ordered and are pending. -We'll continue management as stated above #1.  COPD.  As above, will continue antibiotics, IV Solu-Medrol, and albuterol nebulizer. Will add Atrovent nebulizer.  Chronic combined systolic and diastolic heart failure. Patient's chest x-ray on admission showed bilateral patchy airspace consolidation, more likely from pneumonia or than from acute CHF. His BNP on admission was 128 which was only marginally elevated, but could be falsely low in this obese patient. Per home medication review, he is treated with furosemide, metoprolol and aspirin. All are on hold secondary to intubation. -We'll continue current management; hold the Lasix and metoprolol for now given his soft blood pressures.  Chronic atrial fibrillation Apparently, the patient declined  anticoagulation in the past. Is treated with metoprolol for rate control. His heart rate is within normal limits. However, will add when necessary metoprolol for heart rate sustained greater than 110.  Steroid-induced hyperglycemia. Patient was started on IV Solu-Medrol with an increase in his capillary blood glucose. He was also started on dextrose in his IV fluids. -We'll continue sliding scale NovoLog ordered. Will add Lantus twice a day.   Time spent: Critical care time 45 minutes    [facility]  [facility] Pager [phone]. If 7PM-7AM, please contact night-coverage at [email or web address], password [facility] 11/29/2015, 8:18 AM  LOS: 1 day

## 2015-11-29 NOTE — ED Notes (Signed)
[facility] next dose of zosyn should be given 8 hours after first dose at 22:08

## 2015-11-29 NOTE — Progress Notes (Signed)
Bare Hugger warming is paused at this time due to patient achieving normothermic temp. Will resume temperature checks at 0400 per protocol for ICU.

## 2015-11-29 NOTE — Consult Note (Signed)
Consult requested by: [facility] hospital[facility] Consult requested for respiratory failure:  HPI: This is a 82 year old who developed shortness of breath and came to the emergency department. He was found to be in severe respiratory distress and was intubated soon after arrival. At baseline he is known to have COPD congestive heart failure chronic atrial fibrillation. He was found to have multifocal pneumonia on chest x-ray. He is being treated appropriately with broad-spectrum antibiotics and steroids. He is still requiring 60% oxygen. His blood pressure has been somewhat low and his lactic acid level is elevated. He was hypothermic on admission. He has been somewhat agitated. Initially he was on propofol but with his blood pressure being low he was switched to midazolam. However he is more agitated and is back on propofol now.  Past Medical History  Diagnosis Date  . Hypertension     x30 years  . Hyperlipidemia     x 7-8  . COPD (chronic obstructive pulmonary disease) (HCC)   . Obesity   . Chronic pain   . CHF (congestive heart failure) (HCC)   . Atrial fibrillation (HCC)      Family History  Problem Relation Age of Onset  . Cancer Mother   . Asthma Father   . Heart attack Brother     same mother.  MI in his 3920s  . Coronary artery disease Other      Social History   Social History  . Marital Status: Single    Spouse Name: N/A  . Number of Children: N/A  . Years of Education: N/A   Social History Main Topics  . Smoking status: Former Smoker -- 2.50 packs/day for 25 years    Types: Cigarettes    Quit date: 02/23/1991  . Smokeless tobacco: Never Used  . Alcohol Use: None  . Drug Use: None  . Sexual Activity: Not Asked   Other Topics Concern  . None   Social History Narrative     ROS: Not obtainable    Objective: Vital signs in last 24 hours: Temp:  [94.6 F (34.8 C)-99.8 F (37.7 C)] 99.8 F (37.7 C) (01/08 0745) Pulse Rate:  [61-105] 93 (01/08 0930) Resp:   [11-24] 23 (01/08 0930) BP: (78-143)/(44-96) 121/63 mmHg (01/08 0930) SpO2:  [96 %-100 %] 99 % (01/08 0930) FiO2 (%):  [50 %-100 %] 60 % (01/08 0910) Weight:  [108.41 kg (239 lb)-111.7 kg (246 lb 4.1 oz)] 111.2 kg (245 lb 2.4 oz) (01/08 0500) Weight change:     Intake/Output from previous day: 01/07 0701 - 01/08 0700 In: 316.3 [I.V.:316.3] Out: 200 [Urine:200]  PHYSICAL EXAM He is intubated and sedated. He is still moving around in the bed. His pupils are reactive , nose and throat are clear. His chest shows bilateral rhonchi and some rales. His heart is irregular without gallop. His abdomen is soft obese no masses extremities showed no edema in his central nervous system examination not really able to be assessed but he does move all 4 extremities  Lab Results: Basic Metabolic Panel:  Recent Labs  16/08/9600/07/17 2125  NA 142  K 5.0  CL 98*  CO2 36*  GLUCOSE 181*  BUN 19  CREATININE 1.12  CALCIUM 9.1   Liver Function Tests:  Recent Labs  11/28/15 2125  AST 18  ALT 13*  ALKPHOS 75  BILITOT 1.6*  PROT 6.8  ALBUMIN 3.4*   No results for input(s): LIPASE, AMYLASE in the last 72 hours. No results for input(s): AMMONIA in the last  72 hours. CBC:  Recent Labs  11/28/15 2125  WBC 7.3  NEUTROABS 6.5  HGB 12.6*  HCT 40.2  MCV 97.6  PLT 159   Cardiac Enzymes:  Recent Labs  11/28/15 2125 11/29/15 0121 11/29/15 0643  TROPONINI <0.03 <0.03 <0.03   BNP: No results for input(s): PROBNP in the last 72 hours. D-Dimer: No results for input(s): DDIMER in the last 72 hours. CBG:  Recent Labs  11/28/15 2154 11/29/15 0102 11/29/15 0422 11/29/15 0741  GLUCAP 157* 177* 175* 129*   Hemoglobin A1C: No results for input(s): HGBA1C in the last 72 hours. Fasting Lipid Panel:  Recent Labs  11/28/15 2125  TRIG 61   Thyroid Function Tests:  Recent Labs  11/29/15 0121  TSH 0.750   Anemia Panel: No results for input(s): VITAMINB12, FOLATE, FERRITIN, TIBC, IRON,  RETICCTPCT in the last 72 hours. Coagulation: No results for input(s): LABPROT, INR in the last 72 hours. Urine Drug Screen: Drugs of Abuse  No results found for: LABOPIA, COCAINSCRNUR, LABBENZ, AMPHETMU, THCU, LABBARB  Alcohol Level: No results for input(s): ETH in the last 72 hours. Urinalysis:  Recent Labs  11/28/15 2146  COLORURINE YELLOW  LABSPEC >1.030*  PHURINE 6.0  GLUCOSEU NEGATIVE  HGBUR NEGATIVE  BILIRUBINUR SMALL*  KETONESUR NEGATIVE  PROTEINUR NEGATIVE  NITRITE NEGATIVE  LEUKOCYTESUR NEGATIVE   Misc. Labs:   ABGS:  Recent Labs  11/29/15 0839  PHART 7.393  PO2ART 40.4*  TCO2 12.7  HCO3 29.1*     MICROBIOLOGY: Recent Results (from the past 240 hour(s))  Blood culture (routine x 2)     Status: None (Preliminary result)   Collection Time: 11/28/15  9:25 PM  Result Value Ref Range Status   Specimen Description LEFT ANTECUBITAL  Final   Special Requests BOTTLES DRAWN AEROBIC ONLY 6CC ONLY  Final   Culture NO GROWTH < 12 HOURS  Final   Report Status PENDING  Incomplete  Blood culture (routine x 2)     Status: None (Preliminary result)   Collection Time: 11/28/15  9:25 PM  Result Value Ref Range Status   Specimen Description LEFT ANTECUBITAL  Final   Special Requests BOTTLES DRAWN AEROBIC ONLY 6CC ONLY  Final   Culture NO GROWTH < 12 HOURS  Final   Report Status PENDING  Incomplete  MRSA PCR Screening     Status: None   Collection Time: 11/29/15 12:46 AM  Result Value Ref Range Status   MRSA by PCR NEGATIVE NEGATIVE Final    Comment:        The GeneXpert MRSA Assay (FDA approved for NASAL specimens only), is one component of a comprehensive MRSA colonization surveillance program. It is not intended to diagnose MRSA infection nor to guide or monitor treatment for MRSA infections.     Studies/Results: Dg Chest Portable 1 View  11/28/2015  CLINICAL DATA:  Respiratory distress status post intubation. EXAM: PORTABLE CHEST 1 VIEW COMPARISON:   09/08/2015 FINDINGS: Endotracheal tube overlies the tracheal air column and terminates less than 1 cm superior to the carina. The cardiac silhouette is normal. Mediastinal contours appear intact considering portable technique. There is no evidence of pleural effusion or pneumothorax. There are bilateral lower lobe predominant patchy airspace opacities Osseous structures are without acute abnormality. Soft tissues are grossly normal. IMPRESSION: Status post intubation with endotracheal tube terminating less than a cm from the carina. Bilateral patchy airspace consolidation with lower lobe predominance. These may represent developing pulmonary edema or multifocal pneumonia. Electronically Signed   By:  Ted Mcalpine M.D.   On: 11/28/2015 21:43    Medications:  Prior to Admission:  Prescriptions prior to admission  Medication Sig Dispense Refill Last Dose  . albuterol (PROVENTIL HFA;VENTOLIN HFA) 108 (90 BASE) MCG/ACT inhaler Inhale 2 puffs into the lungs every 6 (six) hours as needed for wheezing or shortness of breath.   Unknown at Unknown time  . albuterol (PROVENTIL) (2.5 MG/3ML) 0.083% nebulizer solution Take 3 mLs (2.5 mg total) by nebulization every 6 (six) hours as needed for wheezing or shortness of breath. 75 mL 12 Unknown at Unknown time  . amLODipine (NORVASC) 10 MG tablet Take 10 mg by mouth daily.   Unknown at Unknown time  . aspirin 81 MG EC tablet Take 81 mg by mouth daily.  0 Unknown at Unknown time  . budesonide-formoterol (SYMBICORT) 160-4.5 MCG/ACT inhaler Inhale 2 puffs into the lungs 2 (two) times daily. 1 Inhaler 5 Unknown at Unknown time  . diltiazem (CARDIZEM CD) 360 MG 24 hr capsule Take 1 capsule (360 mg total) by mouth daily. 30 capsule 6 Unknown at Unknown time  . furosemide (LASIX) 20 MG tablet TAKE 1 TABLET (20 MG TOTAL) BY MOUTH DAILY. 30 tablet 11 Unknown at Unknown time  . guaiFENesin (MUCINEX) 600 MG 12 hr tablet Take 2 tablets (1,200 mg total) by mouth 2 (two)  times daily. 20 tablet 1 Unknown at Unknown time  . LORazepam (ATIVAN) 1 MG tablet Take 1 tablet (1 mg total) by mouth every 8 (eight) hours as needed for anxiety. 60 tablet 5 Unknown at Unknown time  . metoprolol (LOPRESSOR) 50 MG tablet TAKE 1 TABLET (50 MG TOTAL) BY MOUTH DAILY. 30 tablet 5 Unknown at Unknown time  . pravastatin (PRAVACHOL) 40 MG tablet TAKE 1 TABLET (40 MG TOTAL) BY MOUTH DAILY. 30 tablet 3 Unknown at Unknown time  . tamsulosin (FLOMAX) 0.4 MG CAPS capsule Take 1 capsule by mouth daily.   Unknown at Unknown time  . traMADol (ULTRAM) 50 MG tablet TAKE 1 TABLET BY MOUTH EVERY eight HOURS AS NEEDED FOR MODERATE PAIN 90 tablet 5 Unknown at Unknown time   Scheduled: . albuterol  2.5 mg Nebulization Q6H  . antiseptic oral rinse  7 mL Mouth Rinse QID  . aspirin EC  81 mg Oral Daily  . chlorhexidine gluconate  15 mL Mouth Rinse BID  . heparin  5,000 Units Subcutaneous 3 times per day  . insulin aspart  0-15 Units Subcutaneous 6 times per day  . insulin glargine  7 Units Subcutaneous BID  . methylPREDNISolone (SOLU-MEDROL) injection  125 mg Intravenous Once  . methylPREDNISolone (SOLU-MEDROL) injection  40 mg Intravenous Q6H  . piperacillin-tazobactam (ZOSYN)  IV  3.375 g Intravenous Q8H  . sodium chloride  3 mL Intravenous Q12H  . vancomycin  750 mg Intravenous Q12H   Continuous: . dextrose 5 % and 0.9% NaCl 75 mL/hr at 11/29/15 0500  . midazolam (VERSED) infusion 1 mg/hr (11/29/15 0641)  . propofol (DIPRIVAN) infusion 15 mcg/kg/min (11/29/15 0905)   AVW:[id number], fentaNYL (SUBLIMAZE) injection, metoprolol  Assesment: He has hypoxic and hypercapnic respiratory failure acute on chronic. This is multifactorial and includes COPD exacerbation bilateral pneumonia and sepsis. This is complicated by the fact that he has chronic combined systolic and diastolic heart failure. His blood pressure is better and he is receiving moderate dose of IV fluid.  He has been agitated and  switched over to propofol. This seems better and I've added fentanyl on a when necessary basis  He has bilateral pneumonia on broad-spectrum antibiotics which is appropriate. I requested sputum culture.  He is hyperglycemic probably on the basis of medications and acute illness  He was hypothermic on admission and that is improved Principal Problem:   Respiratory failure with hypercapnia (HCC) Active Problems:   Obesity   Chronic pain syndrome   Chronic combined systolic and diastolic congestive heart failure (HCC)   Chronic atrial fibrillation (HCC)   COPD mixed type (HCC)   Bilateral pneumonia   Sepsis (HCC)   Hypothermia   Hyperglycemia, drug-induced   Morbid obesity (HCC)    Plan: Continue current treatments. He is on appropriate medications fluids etc. Repeat chest x-ray and blood gas in the morning. There is no opportunity for any weaning to take place today since he is requiring 60% oxygen    LOS: 1 day   Linda Grimmer L 11/29/2015, 9:46 AM

## 2015-11-30 ENCOUNTER — Inpatient Hospital Stay (HOSPITAL_COMMUNITY): Payer: Medicare Other

## 2015-11-30 DIAGNOSIS — E876 Hypokalemia: Secondary | ICD-10-CM

## 2015-11-30 LAB — CBC
HCT: 34.3 % — ABNORMAL LOW (ref 39.0–52.0)
HEMOGLOBIN: 11.1 g/dL — AB (ref 13.0–17.0)
MCH: 30.2 pg (ref 26.0–34.0)
MCHC: 32.4 g/dL (ref 30.0–36.0)
MCV: 93.2 fL (ref 78.0–100.0)
PLATELETS: 172 10*3/uL (ref 150–400)
RBC: 3.68 MIL/uL — ABNORMAL LOW (ref 4.22–5.81)
RDW: 13.6 % (ref 11.5–15.5)
WBC: 7.6 10*3/uL (ref 4.0–10.5)

## 2015-11-30 LAB — BLOOD GAS, ARTERIAL
ACID-BASE EXCESS: 7.1 mmol/L — AB (ref 0.0–2.0)
ACID-BASE EXCESS: 7.2 mmol/L — AB (ref 0.0–2.0)
Bicarbonate: 31.3 mEq/L — ABNORMAL HIGH (ref 20.0–24.0)
Bicarbonate: 30.7 mEq/L — ABNORMAL HIGH (ref 20.0–24.0)
DRAWN BY: 105551
Drawn by: 105551
FIO2: 0.6
FIO2: 0.6
MECHVT: 600 mL
O2 Saturation: 97.8 %
O2 Saturation: 97.5 %
PCO2 ART: 35.3 mmHg (ref 35.0–45.0)
PEEP: 5 cmH2O
PEEP: 5 cmH2O
PH ART: 7.541 — AB (ref 7.350–7.450)
RATE: 16 resp/min
RATE: 12 resp/min
VT: 600 mL
pCO2 arterial: 43.5 mmHg (ref 35.0–45.0)
pH, Arterial: 7.468 — ABNORMAL HIGH (ref 7.350–7.450)
pO2, Arterial: 92 mmHg (ref 80.0–100.0)
pO2, Arterial: 94.9 mmHg (ref 80.0–100.0)

## 2015-11-30 LAB — BASIC METABOLIC PANEL
ANION GAP: 9 (ref 5–15)
Anion gap: 7 (ref 5–15)
BUN: 24 mg/dL — AB (ref 6–20)
BUN: 24 mg/dL — ABNORMAL HIGH (ref 6–20)
CALCIUM: 9 mg/dL (ref 8.9–10.3)
CHLORIDE: 103 mmol/L (ref 101–111)
CHLORIDE: 106 mmol/L (ref 101–111)
CO2: 31 mmol/L (ref 22–32)
CO2: 29 mmol/L (ref 22–32)
CREATININE: 1.08 mg/dL (ref 0.61–1.24)
Calcium: 9.4 mg/dL (ref 8.9–10.3)
Creatinine, Ser: 0.92 mg/dL (ref 0.61–1.24)
GFR calc non Af Amer: >60 mL/min (ref >60)
Glucose, Bld: 173 mg/dL — ABNORMAL HIGH (ref 65–99)
Glucose, Bld: 131 mg/dL — ABNORMAL HIGH (ref 65–99)
POTASSIUM: 3.8 mmol/L (ref 3.5–5.1)
Potassium: 2.8 mmol/L — ABNORMAL LOW (ref 3.5–5.1)
SODIUM: 141 mmol/L (ref 135–145)
Sodium: 144 mmol/L (ref 135–145)

## 2015-11-30 LAB — GLUCOSE, CAPILLARY
GLUCOSE-CAPILLARY: 136 mg/dL — AB (ref 65–99)
GLUCOSE-CAPILLARY: 140 mg/dL — AB (ref 65–99)
GLUCOSE-CAPILLARY: 151 mg/dL — AB (ref 65–99)
GLUCOSE-CAPILLARY: 160 mg/dL — AB (ref 65–99)
Glucose-Capillary: 149 mg/dL — ABNORMAL HIGH (ref 65–99)
Glucose-Capillary: 116 mg/dL — ABNORMAL HIGH (ref 65–99)

## 2015-11-30 MED ORDER — VANCOMYCIN HCL IN DEXTROSE 750-5 MG/150ML-% IV SOLN
INTRAVENOUS | Status: AC
  Filled 2015-11-30: qty 150

## 2015-11-30 MED ORDER — PNEUMOCOCCAL VAC POLYVALENT 25 MCG/0.5ML IJ INJ
0.5000 mL | INJECTION | INTRAMUSCULAR | Status: AC
  Administered 2015-12-01: 0.5 mL via INTRAMUSCULAR

## 2015-11-30 MED ORDER — POTASSIUM CHLORIDE 20 MEQ/15ML (10%) PO SOLN
40.0000 meq | ORAL | Status: AC
  Administered 2015-11-30 (×2): 40 meq
  Filled 2015-11-30 (×2): qty 30

## 2015-11-30 MED ORDER — KCL IN DEXTROSE-NACL 20-5-0.9 MEQ/L-%-% IV SOLN
INTRAVENOUS | Status: DC
Start: 2015-11-30 — End: 2015-12-08
  Administered 2015-11-30 – 2015-12-08 (×11): via INTRAVENOUS

## 2015-11-30 MED FILL — Medication: Qty: 1 | Status: AC

## 2015-11-30 NOTE — Progress Notes (Signed)
[facility]  The [facility] based on the criteria listed below:   1. Is GFR >/= 40 ml/min? Yes.    Patient's GFR today is >60 2. Is urine output >/= 0.5 ml/kg/hr for the last 6 hours? Yes.   Patient's UOP is 0.8 ml/kg/hr 3. Is BUN < 60 mg/dL? Yes.    Patient's BUN today is 24 4. Abnormal electrolyte(s): K 2.8 5. Ordered repletion with: per protocol 6. If a panic level lab has been reported, has the CCM MD in charge been notified? No..   Physician:    Markus DaftWHELAN, Ariadna Setter A 11/30/2015 6:48 AM

## 2015-11-30 NOTE — Progress Notes (Signed)
Decreasing rate to 12 from 16 , ABG results at 16 were 7.541, 35.3, 92.0 . Patient appears ok to wean to extubate. If other parameters ok.

## 2015-11-30 NOTE — Progress Notes (Signed)
Subjective: He remains intubated and on the ventilator. Chest x-ray this morning is pending. He is now on 50% oxygen. His potassium level is low this morning  Objective: Vital signs in last 24 hours: Temp:  [98.5 F (36.9 C)-99.7 F (37.6 C)] 98.5 F (36.9 C) (01/09 0000) Pulse Rate:  [67-104] 68 (01/09 0600) Resp:  [12-27] 12 (01/09 0600) BP: (80-143)/(36-96) 112/55 mmHg (01/09 0600) SpO2:  [97 %-100 %] 98 % (01/09 0600) FiO2 (%):  [50 %-60 %] 50 % (01/09 0635) Weight change:     Intake/Output from previous day: 01/08 0701 - 01/09 0700 In: 2381.1 [I.V.:2181.1; IV Piggyback:200] Out: 8280 [Urine:1125; Emesis/NG output:100]  PHYSICAL EXAM General appearance: Intubated sedated on mechanical ventilation. Resp: rhonchi bilaterally Cardio: irregularly irregular rhythm GI: soft, non-tender; bowel sounds normal; no masses,  no organomegaly Extremities: extremities normal, atraumatic, no cyanosis or edema  Lab Results:  Results for orders placed or performed during the hospital encounter of 11/28/15 (from the past 48 hour(s))  Comprehensive metabolic panel     Status: Abnormal   Collection Time: 11/28/15  9:25 PM  Result Value Ref Range   Sodium 142 135 - 145 mmol/L   Potassium 5.0 3.5 - 5.1 mmol/L   Chloride 98 (L) 101 - 111 mmol/L   CO2 36 (H) 22 - 32 mmol/L   Glucose, Bld 181 (H) 65 - 99 mg/dL   BUN 19 6 - 20 mg/dL   Creatinine, Ser 1.12 0.61 - 1.24 mg/dL   Calcium 9.1 8.9 - 10.3 mg/dL   Total Protein 6.8 6.5 - 8.1 g/dL   Albumin 3.4 (L) 3.5 - 5.0 g/dL   AST 18 15 - 41 U/L   ALT 13 (L) 17 - 63 U/L   Alkaline Phosphatase 75 38 - 126 U/L   Total Bilirubin 1.6 (H) 0.3 - 1.2 mg/dL   GFR calc non Af Amer >60 >60 mL/min   GFR calc Af Amer >60 >60 mL/min    Comment: (NOTE) The eGFR has been calculated using the CKD EPI equation. This calculation has not been validated in all clinical situations. eGFR's persistently <60 mL/min signify possible Chronic Kidney Disease.     Anion gap 8 5 - 15  CBC WITH DIFFERENTIAL     Status: Abnormal   Collection Time: 11/28/15  9:25 PM  Result Value Ref Range   WBC 7.3 4.0 - 10.5 K/uL   RBC 4.12 (L) 4.22 - 5.81 MIL/uL   Hemoglobin 12.6 (L) 13.0 - 17.0 g/dL   HCT 40.2 39.0 - 52.0 %   MCV 97.6 78.0 - 100.0 fL   MCH 30.6 26.0 - 34.0 pg   MCHC 31.3 30.0 - 36.0 g/dL   RDW 13.2 11.5 - 15.5 %   Platelets 159 150 - 400 K/uL   Neutrophils Relative % 89 %   Neutro Abs 6.5 1.7 - 7.7 K/uL   Lymphocytes Relative 7 %   Lymphs Abs 0.5 (L) 0.7 - 4.0 K/uL   Monocytes Relative 4 %   Monocytes Absolute 0.3 0.1 - 1.0 K/uL   Eosinophils Relative 0 %   Eosinophils Absolute 0.0 0.0 - 0.7 K/uL   Basophils Relative 0 %   Basophils Absolute 0.0 0.0 - 0.1 K/uL  Troponin I     Status: None   Collection Time: 11/28/15  9:25 PM  Result Value Ref Range   Troponin I <0.03 <0.031 ng/mL    Comment:        NO INDICATION OF MYOCARDIAL INJURY.  Brain natriuretic peptide     Status: Abnormal   Collection Time: 11/28/15  9:25 PM  Result Value Ref Range   B Natriuretic Peptide 128.0 (H) 0.0 - 100.0 pg/mL  Blood culture (routine x 2)     Status: None (Preliminary result)   Collection Time: 11/28/15  9:25 PM  Result Value Ref Range   Specimen Description LEFT ANTECUBITAL    Special Requests BOTTLES DRAWN AEROBIC ONLY 6CC ONLY    Culture NO GROWTH 2 DAYS    Report Status PENDING   Blood culture (routine x 2)     Status: None (Preliminary result)   Collection Time: 11/28/15  9:25 PM  Result Value Ref Range   Specimen Description LEFT ANTECUBITAL    Special Requests BOTTLES DRAWN AEROBIC ONLY 6CC ONLY    Culture NO GROWTH 2 DAYS    Report Status PENDING   Triglycerides     Status: None   Collection Time: 11/28/15  9:25 PM  Result Value Ref Range   Triglycerides 61 <150 mg/dL  Urinalysis, Routine w reflex microscopic (not at [facility])     Status: Abnormal   Collection Time: 11/28/15  9:46 PM  Result Value Ref Range   Color, Urine YELLOW  YELLOW   APPearance CLEAR CLEAR   Specific Gravity, Urine >1.030 (H) 1.005 - 1.030   pH 6.0 5.0 - 8.0   Glucose, UA NEGATIVE NEGATIVE mg/dL   Hgb urine dipstick NEGATIVE NEGATIVE   Bilirubin Urine SMALL (A) NEGATIVE   Ketones, ur NEGATIVE NEGATIVE mg/dL   Protein, ur NEGATIVE NEGATIVE mg/dL   Nitrite NEGATIVE NEGATIVE   Leukocytes, UA NEGATIVE NEGATIVE    Comment: MICROSCOPIC NOT DONE ON URINES WITH NEGATIVE PROTEIN, BLOOD, LEUKOCYTES, NITRITE, OR GLUCOSE <1000 mg/dL.  Lactic acid, plasma     Status: None   Collection Time: 11/28/15  9:52 PM  Result Value Ref Range   Lactic Acid, Venous 1.6 0.5 - 2.0 mmol/L  CBG monitoring, ED     Status: Abnormal   Collection Time: 11/28/15  9:54 PM  Result Value Ref Range   Glucose-Capillary 157 (H) 65 - 99 mg/dL  Blood gas, arterial     Status: Abnormal   Collection Time: 11/28/15  9:55 PM  Result Value Ref Range   FIO2 100.00    Delivery systems VENTILATOR    Mode PRESSURE REGULATED VOLUME CONTROL    VT 550 mL   LHR 14.0 resp/min   Peep/cpap 5.0 cm H20   pH, Arterial 7.201 (L) 7.350 - 7.450   pCO2 arterial 90.1 (HH) 35.0 - 45.0 mmHg    Comment: CRITICAL RESULT CALLED TO, READ BACK BY AND VERIFIED WITH: Jamison Neighbor RN AT 2207 11/28/15 ANDERSON, S RRT    pO2, Arterial 262.0 (H) 80.0 - 100.0 mmHg   Bicarbonate 27.3 (H) 20.0 - 24.0 mEq/L   TCO2 18.1 0 - 100 mmol/L   Acid-Base Excess 6.3 (H) 0.0 - 2.0 mmol/L   O2 Saturation 99.6 %   Collection site LEFT RADIAL    Drawn by [id number]    Sample type ARTERIAL    Allens test (pass/fail) PASS PASS  Draw ABG 1 hour after initiation of ventilator     Status: Abnormal   Collection Time: 11/29/15 12:05 AM  Result Value Ref Range   FIO2 60.00    Delivery systems VENTILATOR    Mode PRESSURE REGULATED VOLUME CONTROL    VT 550 mL   LHR 20.0 resp/min   Peep/cpap 0.0 cm H20   pH,  Arterial 7.298 (L) 7.350 - 7.450   pCO2 arterial 60.0 (HH) 35.0 - 45.0 mmHg    Comment: CRITICAL RESULT CALLED TO, READ  BACK BY AND VERIFIED WITH: WAGONER R. RN AT 0041 11/29/15 ANDERSON S RRT    pO2, Arterial 80.2 80.0 - 100.0 mmHg   Bicarbonate 25.4 (H) 20.0 - 24.0 mEq/L   TCO2 17.2 0 - 100 mmol/L   Acid-Base Excess 2.6 (H) 0.0 - 2.0 mmol/L   O2 Saturation 95.6 %   Collection site RIGHT RADIAL    Drawn by [id number]    Sample type ARTERIAL    Allens test (pass/fail) PASS PASS  MRSA PCR Screening     Status: None   Collection Time: 11/29/15 12:46 AM  Result Value Ref Range   MRSA by PCR NEGATIVE NEGATIVE    Comment:        The GeneXpert MRSA Assay (FDA approved for NASAL specimens only), is one component of a comprehensive MRSA colonization surveillance program. It is not intended to diagnose MRSA infection nor to guide or monitor treatment for MRSA infections.   Glucose, capillary     Status: Abnormal   Collection Time: 11/29/15  1:02 AM  Result Value Ref Range   Glucose-Capillary 177 (H) 65 - 99 mg/dL   Comment 1 Notify RN   Lactic acid, plasma     Status: Abnormal   Collection Time: 11/29/15  1:21 AM  Result Value Ref Range   Lactic Acid, Venous 3.7 (HH) 0.5 - 2.0 mmol/L    Comment: CRITICAL RESULT CALLED TO, READ BACK BY AND VERIFIED WITH:  HEARN,J @ 0205 ON 11/29/15 BY WOODIE,J   TSH     Status: None   Collection Time: 11/29/15  1:21 AM  Result Value Ref Range   TSH 0.750 0.350 - 4.500 uIU/mL  Troponin I     Status: None   Collection Time: 11/29/15  1:21 AM  Result Value Ref Range   Troponin I <0.03 <0.031 ng/mL    Comment:        NO INDICATION OF MYOCARDIAL INJURY.   Glucose, capillary     Status: Abnormal   Collection Time: 11/29/15  4:22 AM  Result Value Ref Range   Glucose-Capillary 175 (H) 65 - 99 mg/dL   Comment 1 Notify RN   Troponin I     Status: None   Collection Time: 11/29/15  6:43 AM  Result Value Ref Range   Troponin I <0.03 <0.031 ng/mL    Comment:        NO INDICATION OF MYOCARDIAL INJURY.   Glucose, capillary     Status: Abnormal   Collection Time: 11/29/15   7:41 AM  Result Value Ref Range   Glucose-Capillary 129 (H) 65 - 99 mg/dL  Blood gas, arterial     Status: Abnormal   Collection Time: 11/29/15  8:39 AM  Result Value Ref Range   FIO2 50.00    Delivery systems VENTILATOR    Mode PRESSURE REGULATED VOLUME CONTROL    VT 600 mL   LHR 20 resp/min   pH, Arterial 7.393 7.350 - 7.450   pCO2 arterial 52.7 (H) 35.0 - 45.0 mmHg   pO2, Arterial 40.4 (L) 80.0 - 100.0 mmHg   Bicarbonate 29.1 (H) 20.0 - 24.0 mEq/L   TCO2 12.7 0 - 100 mmol/L   Acid-Base Excess 6.6 (H) 0.0 - 2.0 mmol/L   O2 Saturation 75.7 %   Patient temperature 37.0    Collection site LEFT RADIAL  Drawn by [id number]    Sample type ARTERIAL    Allens test (pass/fail) PASS PASS  Glucose, capillary     Status: Abnormal   Collection Time: 11/29/15 11:31 AM  Result Value Ref Range   Glucose-Capillary 133 (H) 65 - 99 mg/dL  Blood gas, arterial     Status: Abnormal   Collection Time: 11/29/15 12:20 PM  Result Value Ref Range   FIO2 60.00    Delivery systems VENTILATOR    Mode PRESSURE REGULATED VOLUME CONTROL    VT 600 mL   LHR 20 resp/min   Peep/cpap 5.0 cm H20   pH, Arterial 7.583 (H) 7.350 - 7.450   pCO2 arterial 33.3 (L) 35.0 - 45.0 mmHg   pO2, Arterial 87.8 80.0 - 100.0 mmHg   Bicarbonate 33.0 (H) 20.0 - 24.0 mEq/L   TCO2 16.3 0 - 100 mmol/L   Acid-Base Excess 8.8 (H) 0.0 - 2.0 mmol/L   O2 Saturation 98.2 %   Patient temperature 37.0    Collection site RIGHT RADIAL    Drawn by [id number]    Sample type ARTERIAL    Allens test (pass/fail) PASS PASS  Troponin I     Status: None   Collection Time: 11/29/15  1:12 PM  Result Value Ref Range   Troponin I <0.03 <0.031 ng/mL    Comment:        NO INDICATION OF MYOCARDIAL INJURY.   Glucose, capillary     Status: Abnormal   Collection Time: 11/29/15  3:36 PM  Result Value Ref Range   Glucose-Capillary 117 (H) 65 - 99 mg/dL  Glucose, capillary     Status: Abnormal   Collection Time: 11/29/15  7:56 PM  Result Value Ref  Range   Glucose-Capillary 139 (H) 65 - 99 mg/dL   Comment 1 Notify RN   Glucose, capillary     Status: Abnormal   Collection Time: 11/30/15 12:58 AM  Result Value Ref Range   Glucose-Capillary 151 (H) 65 - 99 mg/dL  Blood gas, arterial     Status: Abnormal   Collection Time: 11/30/15  3:07 AM  Result Value Ref Range   FIO2 0.60    Delivery systems VENTILATOR    Mode PRESSURE REGULATED VOLUME CONTROL    VT 600 mL   LHR 16 resp/min   Peep/cpap 5.0 cm H20   pH, Arterial 7.541 (H) 7.350 - 7.450   pCO2 arterial 35.3 35.0 - 45.0 mmHg   pO2, Arterial 92.0 80.0 - 100.0 mmHg   Bicarbonate 31.3 (H) 20.0 - 24.0 mEq/L   Acid-Base Excess 7.1 (H) 0.0 - 2.0 mmol/L   O2 Saturation 97.8 %   Collection site RADIAL    Drawn by [id number]    Sample type ARTERIAL    Allens test (pass/fail) PASS PASS  Glucose, capillary     Status: Abnormal   Collection Time: 11/30/15  4:14 AM  Result Value Ref Range   Glucose-Capillary 140 (H) 65 - 99 mg/dL  CBC     Status: Abnormal   Collection Time: 11/30/15  4:23 AM  Result Value Ref Range   WBC 7.6 4.0 - 10.5 K/uL   RBC 3.68 (L) 4.22 - 5.81 MIL/uL   Hemoglobin 11.1 (L) 13.0 - 17.0 g/dL   HCT 34.3 (L) 39.0 - 52.0 %   MCV 93.2 78.0 - 100.0 fL   MCH 30.2 26.0 - 34.0 pg   MCHC 32.4 30.0 - 36.0 g/dL   RDW 13.6 11.5 - 15.5 %   Platelets  172 150 - 400 K/uL  Basic metabolic panel     Status: Abnormal   Collection Time: 11/30/15  4:23 AM  Result Value Ref Range   Sodium 141 135 - 145 mmol/L   Potassium 2.8 (L) 3.5 - 5.1 mmol/L    Comment: DELTA CHECK NOTED   Chloride 103 101 - 111 mmol/L   CO2 31 22 - 32 mmol/L   Glucose, Bld 173 (H) 65 - 99 mg/dL   BUN 24 (H) 6 - 20 mg/dL   Creatinine, Ser 1.08 0.61 - 1.24 mg/dL   Calcium 9.0 8.9 - 10.3 mg/dL   GFR calc non Af Amer >60 >60 mL/min   GFR calc Af Amer >60 >60 mL/min    Comment: (NOTE) The eGFR has been calculated using the CKD EPI equation. This calculation has not been validated in all clinical  situations. eGFR's persistently <60 mL/min signify possible Chronic Kidney Disease.    Anion gap 7 5 - 15  Blood gas, arterial     Status: Abnormal   Collection Time: 11/30/15  6:19 AM  Result Value Ref Range   FIO2 0.60    Delivery systems VENTILATOR    Mode PRESSURE REGULATED VOLUME CONTROL    VT 600 mL   LHR 12 resp/min   Peep/cpap 5.0 cm H20   pH, Arterial 7.468 (H) 7.350 - 7.450   pCO2 arterial 43.5 35.0 - 45.0 mmHg   pO2, Arterial 94.9 80.0 - 100.0 mmHg   Bicarbonate 30.7 (H) 20.0 - 24.0 mEq/L   Acid-Base Excess 7.2 (H) 0.0 - 2.0 mmol/L   O2 Saturation 97.5 %   Collection site RADIAL    Drawn by [id number]    Sample type ARTERIAL    Allens test (pass/fail) PASS PASS    ABGS  Recent Labs  11/29/15 1220  11/30/15 0619  PHART 7.583*  < > 7.468*  PO2ART 87.8  < > 94.9  TCO2 16.3  --   --   HCO3 33.0*  < > 30.7*  < > = values in this interval not displayed. CULTURES Recent Results (from the past 240 hour(s))  Blood culture (routine x 2)     Status: None (Preliminary result)   Collection Time: 11/28/15  9:25 PM  Result Value Ref Range Status   Specimen Description LEFT ANTECUBITAL  Final   Special Requests BOTTLES DRAWN AEROBIC ONLY 6CC ONLY  Final   Culture NO GROWTH 2 DAYS  Final   Report Status PENDING  Incomplete  Blood culture (routine x 2)     Status: None (Preliminary result)   Collection Time: 11/28/15  9:25 PM  Result Value Ref Range Status   Specimen Description LEFT ANTECUBITAL  Final   Special Requests BOTTLES DRAWN AEROBIC ONLY 6CC ONLY  Final   Culture NO GROWTH 2 DAYS  Final   Report Status PENDING  Incomplete  MRSA PCR Screening     Status: None   Collection Time: 11/29/15 12:46 AM  Result Value Ref Range Status   MRSA by PCR NEGATIVE NEGATIVE Final    Comment:        The GeneXpert MRSA Assay (FDA approved for NASAL specimens only), is one component of a comprehensive MRSA colonization surveillance program. It is not intended to diagnose  MRSA infection nor to guide or monitor treatment for MRSA infections.    Studies/Results: Dg Chest Port 1 View  11/29/2015  CLINICAL DATA:  Respiratory failure EXAM: PORTABLE CHEST 1 VIEW COMPARISON:  Chest x-rays dated 11/28/2015 and  08/27/2015. FINDINGS: Endotracheal tube remains well positioned with tip just above the level of the carina. Enteric tube passes below the diaphragm. Mild cardiomegaly is unchanged. Overall cardiomediastinal silhouette is stable in size and configuration. Central pulmonary vascular congestion persists with mild bilateral interstitial edema, unchanged compared to yesterday's exam, significantly worsened compared to exam of 09/08/2015. Some component of the perihilar opacities likely related to chronic underlying pulmonary artery hypertension. Suspect additional mild bibasilar atelectasis and/or small pleural effusions. No new lung abnormality. IMPRESSION: 1. Overall stable appearance compared to chest x-ray of 11/28/2015. 2. Persistent central pulmonary vascular congestion and mild bilateral interstitial edema, not significantly changed in the short-term interval, suggesting mild volume overload/CHF. Patchy bibasilar airspace opacities are also unchanged, compatible with either atelectasis or pneumonia, favor atelectasis. Also suspect small bilateral pleural effusions. 3. Some component of the perihilar opacities likely related to chronic pulmonary artery enlargement caused by underlying pulmonary artery hypertension. 4. Endotracheal tube remains well positioned with tip just above the level of the carina. Electronically Signed   By: Franki Cabot M.D.   On: 11/29/2015 11:07   Dg Chest Portable 1 View  11/28/2015  CLINICAL DATA:  Respiratory distress status post intubation. EXAM: PORTABLE CHEST 1 VIEW COMPARISON:  09/08/2015 FINDINGS: Endotracheal tube overlies the tracheal air column and terminates less than 1 cm superior to the carina. The cardiac silhouette is normal.  Mediastinal contours appear intact considering portable technique. There is no evidence of pleural effusion or pneumothorax. There are bilateral lower lobe predominant patchy airspace opacities Osseous structures are without acute abnormality. Soft tissues are grossly normal. IMPRESSION: Status post intubation with endotracheal tube terminating less than a cm from the carina. Bilateral patchy airspace consolidation with lower lobe predominance. These may represent developing pulmonary edema or multifocal pneumonia. Electronically Signed   By: Fidela Salisbury M.D.   On: 11/28/2015 21:43    Medications:  Prior to Admission:  Prescriptions prior to admission  Medication Sig Dispense Refill Last Dose  . albuterol (PROVENTIL HFA;VENTOLIN HFA) 108 (90 BASE) MCG/ACT inhaler Inhale 2 puffs into the lungs every 6 (six) hours as needed for wheezing or shortness of breath.   Unknown at Unknown time  . albuterol (PROVENTIL) (2.5 MG/3ML) 0.083% nebulizer solution Take 3 mLs (2.5 mg total) by nebulization every 6 (six) hours as needed for wheezing or shortness of breath. 75 mL 12 Unknown at Unknown time  . amLODipine (NORVASC) 10 MG tablet Take 10 mg by mouth daily.   Unknown at Unknown time  . aspirin 81 MG EC tablet Take 81 mg by mouth daily.  0 Unknown at Unknown time  . budesonide-formoterol (SYMBICORT) 160-4.5 MCG/ACT inhaler Inhale 2 puffs into the lungs 2 (two) times daily. 1 Inhaler 5 Unknown at Unknown time  . diltiazem (CARDIZEM CD) 360 MG 24 hr capsule Take 1 capsule (360 mg total) by mouth daily. 30 capsule 6 Unknown at Unknown time  . furosemide (LASIX) 20 MG tablet TAKE 1 TABLET (20 MG TOTAL) BY MOUTH DAILY. 30 tablet 11 Unknown at Unknown time  . guaiFENesin (MUCINEX) 600 MG 12 hr tablet Take 2 tablets (1,200 mg total) by mouth 2 (two) times daily. 20 tablet 1 Unknown at Unknown time  . LORazepam (ATIVAN) 1 MG tablet Take 1 tablet (1 mg total) by mouth every 8 (eight) hours as needed for anxiety.  60 tablet 5 Unknown at Unknown time  . metoprolol (LOPRESSOR) 50 MG tablet TAKE 1 TABLET (50 MG TOTAL) BY MOUTH DAILY. 30 tablet 5 Unknown  at Unknown time  . pravastatin (PRAVACHOL) 40 MG tablet TAKE 1 TABLET (40 MG TOTAL) BY MOUTH DAILY. 30 tablet 3 Unknown at Unknown time  . tamsulosin (FLOMAX) 0.4 MG CAPS capsule Take 1 capsule by mouth daily.   Unknown at Unknown time  . traMADol (ULTRAM) 50 MG tablet TAKE 1 TABLET BY MOUTH EVERY eight HOURS AS NEEDED FOR MODERATE PAIN 90 tablet 5 Unknown at Unknown time   Scheduled: . albuterol  2.5 mg Nebulization Q6H  . antiseptic oral rinse  7 mL Mouth Rinse QID  . aspirin EC  81 mg Oral Daily  . chlorhexidine gluconate  15 mL Mouth Rinse BID  . heparin  5,000 Units Subcutaneous 3 times per day  . insulin aspart  0-15 Units Subcutaneous 6 times per day  . insulin glargine  7 Units Subcutaneous BID  . methylPREDNISolone (SOLU-MEDROL) injection  125 mg Intravenous Once  . methylPREDNISolone (SOLU-MEDROL) injection  40 mg Intravenous Q6H  . piperacillin-tazobactam (ZOSYN)  IV  3.375 g Intravenous Q8H  . potassium chloride  40 mEq Per Tube Q4H  . sodium chloride  3 mL Intravenous Q12H  . vancomycin  750 mg Intravenous Q12H   Continuous: . dextrose 5 % and 0.9% NaCl 75 mL/hr at 11/30/15 0600  . midazolam (VERSED) infusion Stopped (11/30/15 0300)  . propofol (DIPRIVAN) infusion 40 mcg/kg/min (11/30/15 0656)   GNO:[id number], fentaNYL (SUBLIMAZE) injection, metoprolol  Assesment: He has acute on chronic hypoxic/hypercapnic respiratory failure which I think is multi-factorial and includes COPD and pneumonia. He was septic on admission. He was hypothermic on admission. He has improved and has been able to reduce his FiO2.  He has obesity and probably some element of obesity hypoventilation.  He has chronic combined systolic and diastolic heart failure which is being treated.  His potassium level is low this morning and that is being  replaced   Principal Problem:   Respiratory failure with hypercapnia (HCC) Active Problems:   Obesity   Chronic pain syndrome   Chronic combined systolic and diastolic congestive heart failure (HCC)   Chronic atrial fibrillation (HCC)   COPD mixed type (HCC)   Bilateral pneumonia   Sepsis ([place])   Hypothermia   Hyperglycemia, drug-induced   Morbid obesity ([place])    Plan: If his FiO2 can be weaned further we can see if he is a candidate for weaning and potential extubation later today.    LOS: 2 days   Adante Courington L 11/30/2015, 7:53 AM

## 2015-11-30 NOTE — Progress Notes (Signed)
[facility] PROGRESS NOTE  Logan West ZOX:[id number] DOB: 11-05-1934 DOA: 11/28/2015 PCP: Mechele Claude, MD   Code Status: Full code Family Communication: family not available  Disposition Plan: discharge when clinically appropriate   Consultants:  Pulmonology  Procedures:  Intubation and mechanical ventilation started in the ED  Antibiotics:  Zosyn 11/29/15 >>  Vancomycin 11/29/15 >>  HPI/Subjective: Nursing reports a brief episode of bradycardia, but otherwise, no acute changes overnight. The patient no longer has a body tremor.  Objective: Filed Vitals:   11/30/15 0843 11/30/15 0900  BP:  112/62  Pulse: 62 67  Temp:  96.6 F (35.9 C)  Resp: 12 12   oxygen saturation on the ventilator 97% on the ventilator.  Intake/Output Summary (Last 24 hours) at 11/30/15 1120 Last data filed at 11/30/15 0600  Gross per 24 hour  Intake 1931.09 ml  Output   1225 ml  Net 706.09 ml   Filed Weights   11/28/15 2140 11/29/15 0045 11/29/15 0500  Weight: 108.41 kg (239 lb) 111.7 kg (246 lb 4.1 oz) 111.2 kg (245 lb 2.4 oz)    Exam:   General:  morbidly obese 82 year old Caucasian man on the ventilator. He is sedated on propofol.  Cardiovascular: distant irregular-irregular   Respiratory: bilateral/bibasilar crackles; decreased breath sounds in the bases. Mechanically ventilated.  Abdomen: morbidly obese, hypoactive bowel sounds, soft, no obvious tenderness.  Musculoskeletal/extremities: Trace to 1+ pedal edema.  Neurologic: The patient is sedated. Whole-body tremor secondary to anxiety has resolved on propofol.  Data Reviewed: Basic Metabolic Panel:  Recent Labs Lab 11/28/15 2125 11/30/15 0423  NA 142 141  K 5.0 2.8*  CL 98* 103  CO2 36* 31  GLUCOSE 181* 173*  BUN 19 24*  CREATININE 1.12 1.08  CALCIUM 9.1 9.0   Liver Function Tests:  Recent Labs Lab 11/28/15 2125  AST 18  ALT 13*  ALKPHOS 75  BILITOT 1.6*  PROT 6.8  ALBUMIN 3.4*   No results  for input(s): LIPASE, AMYLASE in the last 168 hours. No results for input(s): AMMONIA in the last 168 hours. CBC:  Recent Labs Lab 11/28/15 2125 11/30/15 0423  WBC 7.3 7.6  NEUTROABS 6.5  --   HGB 12.6* 11.1*  HCT 40.2 34.3*  MCV 97.6 93.2  PLT 159 172   Cardiac Enzymes:  Recent Labs Lab 11/28/15 2125 11/29/15 0121 11/29/15 0643 11/29/15 1312  TROPONINI <0.03 <0.03 <0.03 <0.03   BNP (last 3 results)  Recent Labs  08/27/15 2301 11/04/15 1410 11/28/15 2125  BNP 160.6* 126.5* 128.0*    ProBNP (last 3 results) No results for input(s): PROBNP in the last 8760 hours.  CBG:  Recent Labs Lab 11/29/15 1536 11/29/15 1956 11/30/15 0058 11/30/15 0414 11/30/15 0808  GLUCAP 117* 139* 151* 140* 149*    Recent Results (from the past 240 hour(s))  Blood culture (routine x 2)     Status: None (Preliminary result)   Collection Time: 11/28/15  9:25 PM  Result Value Ref Range Status   Specimen Description LEFT ANTECUBITAL  Final   Special Requests BOTTLES DRAWN AEROBIC ONLY 6CC ONLY  Final   Culture NO GROWTH 2 DAYS  Final   Report Status PENDING  Incomplete  Blood culture (routine x 2)     Status: None (Preliminary result)   Collection Time: 11/28/15  9:25 PM  Result Value Ref Range Status   Specimen Description LEFT ANTECUBITAL  Final   Special Requests BOTTLES DRAWN AEROBIC ONLY 6CC ONLY  Final   Culture NO  GROWTH 2 DAYS  Final   Report Status PENDING  Incomplete  MRSA PCR Screening     Status: None   Collection Time: 11/29/15 12:46 AM  Result Value Ref Range Status   MRSA by PCR NEGATIVE NEGATIVE Final    Comment:        The GeneXpert MRSA Assay (FDA approved for NASAL specimens only), is one component of a comprehensive MRSA colonization surveillance program. It is not intended to diagnose MRSA infection nor to guide or monitor treatment for MRSA infections.   Culture, respiratory (NON-Expectorated)     Status: None (Preliminary result)   Collection  Time: 11/29/15  3:18 PM  Result Value Ref Range Status   Specimen Description TRACHEAL ASPIRATE  Final   Special Requests NONE  Final   Gram Stain   Final    NO WBC SEEN NO SQUAMOUS EPITHELIAL CELLS SEEN NO ORGANISMS SEEN Performed at [facility]    Culture NO GROWTH Performed at [facility]   Final   Report Status PENDING  Incomplete     Studies: Dg Chest Port 1 View  11/30/2015  CLINICAL DATA:  Respiratory failure EXAM: PORTABLE CHEST 1 VIEW COMPARISON:  Yesterday FINDINGS: Endotracheal tube tip is between the clavicular heads and carina. Gastric suction tube is not visible below the thoracic inlet due to underpenetration. Increasing hazy basilar opacities, likely atelectasis and pleural fluid. Apparent increase could be from dependent flow. There could be underlying airspace opacity. No pulmonary edema seen in the apical lungs. Normal heart size and stable mediastinal contours. IMPRESSION: 1. Hazy basilar opacities favoring atelectasis and layering pleural fluid. 2. Endotracheal tube remains in good position. Most of the orogastric tube is not visible due to technical factors. Electronically Signed   By: Marnee Spring M.D.   On: 11/30/2015 09:08   Dg Chest Port 1 View  11/29/2015  CLINICAL DATA:  Respiratory failure EXAM: PORTABLE CHEST 1 VIEW COMPARISON:  Chest x-rays dated 11/28/2015 and 08/27/2015. FINDINGS: Endotracheal tube remains well positioned with tip just above the level of the carina. Enteric tube passes below the diaphragm. Mild cardiomegaly is unchanged. Overall cardiomediastinal silhouette is stable in size and configuration. Central pulmonary vascular congestion persists with mild bilateral interstitial edema, unchanged compared to yesterday's exam, significantly worsened compared to exam of 09/08/2015. Some component of the perihilar opacities likely related to chronic underlying pulmonary artery hypertension. Suspect additional mild bibasilar atelectasis  and/or small pleural effusions. No new lung abnormality. IMPRESSION: 1. Overall stable appearance compared to chest x-ray of 11/28/2015. 2. Persistent central pulmonary vascular congestion and mild bilateral interstitial edema, not significantly changed in the short-term interval, suggesting mild volume overload/CHF. Patchy bibasilar airspace opacities are also unchanged, compatible with either atelectasis or pneumonia, favor atelectasis. Also suspect small bilateral pleural effusions. 3. Some component of the perihilar opacities likely related to chronic pulmonary artery enlargement caused by underlying pulmonary artery hypertension. 4. Endotracheal tube remains well positioned with tip just above the level of the carina. Electronically Signed   By: Bary Richard M.D.   On: 11/29/2015 11:07   Dg Chest Portable 1 View  11/28/2015  CLINICAL DATA:  Respiratory distress status post intubation. EXAM: PORTABLE CHEST 1 VIEW COMPARISON:  09/08/2015 FINDINGS: Endotracheal tube overlies the tracheal air column and terminates less than 1 cm superior to the carina. The cardiac silhouette is normal. Mediastinal contours appear intact considering portable technique. There is no evidence of pleural effusion or pneumothorax. There are bilateral lower lobe predominant patchy airspace  opacities Osseous structures are without acute abnormality. Soft tissues are grossly normal. IMPRESSION: Status post intubation with endotracheal tube terminating less than a cm from the carina. Bilateral patchy airspace consolidation with lower lobe predominance. These may represent developing pulmonary edema or multifocal pneumonia. Electronically Signed   By: Ted Mcalpineobrinka  Dimitrova M.D.   On: 11/28/2015 21:43    Scheduled Meds: . albuterol  2.5 mg Nebulization Q6H  . antiseptic oral rinse  7 mL Mouth Rinse QID  . aspirin EC  81 mg Oral Daily  . chlorhexidine gluconate  15 mL Mouth Rinse BID  . heparin  5,000 Units Subcutaneous 3 times per day   . insulin aspart  0-15 Units Subcutaneous 6 times per day  . insulin glargine  7 Units Subcutaneous BID  . methylPREDNISolone (SOLU-MEDROL) injection  125 mg Intravenous Once  . methylPREDNISolone (SOLU-MEDROL) injection  40 mg Intravenous Q6H  . piperacillin-tazobactam (ZOSYN)  IV  3.375 g Intravenous Q8H  . [START ON 12/01/2015] pneumococcal 23 valent vaccine  0.5 mL Intramuscular Tomorrow-1000  . sodium chloride  3 mL Intravenous Q12H  . vancomycin  750 mg Intravenous Q12H   Continuous Infusions: . dextrose 5 % and 0.9 % NaCl with KCl 20 mEq/L    . midazolam (VERSED) infusion Stopped (11/30/15 0300)  . propofol (DIPRIVAN) infusion 40 mcg/kg/min (11/30/15 16100821)   Assessment and plan: Principal Problem:   Respiratory failure with hypercapnia (HCC) Active Problems:   Bilateral pneumonia   Sepsis (HCC)   COPD mixed type (HCC)   Hypothermia   Obesity   Chronic pain syndrome   Chronic combined systolic and diastolic congestive heart failure (HCC)   Chronic atrial fibrillation (HCC)   Hyperglycemia, drug-induced   Morbid obesity (HCC)   Hypokalemia    1. Acute respiratory failure with hypercapnia and respiratory acidosis, secondary to bilateral pneumonia. -The patient's admission chest x-ray revealed bilateral patches airspace consolidation, likely from multifocal pneumonia. Pulmonary edema was considered, but the patient's BNP was only modestly elevated. He was hypothermic on admission with temperature of 95.5. He was intubated on arrival to the ED. Initial ABG on the ventilator revealed a pH of 7.2, PCO2 of 90, and PO2 of 262. -We'll continue Zosyn, vancomycin, and Solu-Medrol. Will continue albuterol nebulizer; Atrovent nebulizer was added. -Propofol was later added for his agitation; now resolved. -Pulmonologist, Dr. Juanetta GoslingHawkins has been consulted. He may ventilator changers with respiratory therapy. Patient's ABG is much better. Dr. Juanetta GoslingHawkins is considering a potential for weaning  later today.  Early sepsis with lactic acidosis, secondary to bilateral pneumonia. The patient's blood pressure and lactic acid were initially within normal limits. However, he was hypothermic on admission. His lactic acid increased to 3.7. He did come hypotensive following intubation, but that may have been from the mechanics of intubation. Blood cultures were ordered and are negative today. Tracheal aspirate culture reveals no growth 1 day. -We'll continue management as stated above #1.  COPD.  As above, will continue antibiotics, IV Solu-Medrol, and duonebs  Chronic combined systolic and diastolic heart failure. Patient's chest x-ray on admission showed bilateral patchy airspace consolidation, more likely from pneumonia or than from acute CHF. His BNP on admission was 128 which was only marginally elevated, but could be falsely low in this obese patient. Per home medication review, he is treated with furosemide, metoprolol and aspirin. All are on hold secondary to intubation. -We'll continue current management; hold the Lasix and metoprolol for now given his soft blood pressures. -We'll give aspirin per rectum.  Chronic atrial fibrillation Apparently, the patient declined anticoagulation in the past. Is treated with metoprolol for rate control. His heart rate is within normal limits. However, will add when necessary metoprolol for heart rate sustained greater than 110. His TSH is within normal limits.  Hypokalemia. Patient's serum sodium was 5.0 on admission, but has fallen to 2.9. Potassium chloride via the NG tube has been ordered. -We'll potassium to the IV fluids.  Steroid-induced hyperglycemia. Patient was started on IV Solu-Medrol with an increase in his capillary blood glucose. He was also started on dextrose in his IV fluids. -Sliding scale NovoLog and then Lantus were ordered-we'll continue His CBGs have been reasonable.   Time spent: Critical care time 35  minutes    [facility]  [facility] Pager [phone]. If 7PM-7AM, please contact night-coverage at [email or web address], password [facility] 11/30/2015, 11:20 AM  LOS: 2 days

## 2015-11-30 NOTE — Progress Notes (Signed)
Pt's temp dropped to 95.5 degrees rectally. Warming blanket re-applied.

## 2015-11-30 NOTE — Progress Notes (Signed)
Initial Nutrition Assessment  DOCUMENTATION CODES:   Obesity unspecified  INTERVENTION:  If unable to wean as anticipated recommend : Initiate Vital HP @ 10 ml/hr via OGT and add 60 ml Prostat TID  Tube feeding regimen provides 840 kcal, 111 gr grams of protein, and 200 ml of H2O. Provides 1545 total kcal including propofol.  NUTRITION DIAGNOSIS:   Inadequate oral intake related to inability to eat as evidenced by NPO status.  GOAL:   Provide needs based on ASPEN/SCCM guidelines   MONITOR:   Vent status, Weight trends, Labs, I & O's  REASON FOR ASSESSMENT:   Ventilator    ASSESSMENT:  Patient is currently intubated on ventilator support MV: 7 L/min Temp (24hrs), Avg:97.4 F (36.3 C), Min:95.6 F (35.3 C), Max:98.8 F (37.1 C)  Propofol: 26.7 ml/hr provides 705 kcal every 24 hr at current rate   Labs: potassium 2.8, BUN 24, glucose 160 today.   Diet Order:  Diet NPO time specified  Skin:   dry, intact  Last BM:    distended abdomen per nursing, last BM prior to admission  Height:   Ht Readings from Last 1 Encounters:  11/30/15 6' (1.829 m)    Weight:   Wt Readings from Last 1 Encounters:  11/29/15 245 lb 2.4 oz (111.2 kg)  Usual weight range (108-111 kg)  Ideal Body Weight:  80.9 kg  BMI:  Body mass index is 33.24 kg/(m^2).  Estimated Nutritional Needs:   Kcal:  1566  Protein: 122 gr  Fluid:  1.6 liters daily  EDUCATION NEEDS:   No education needs identified at this time  Royann ShiversLynn Ivyonna Hoelzel MS,RD,CSG,LDN Office: #[phone] Pager: [phone]

## 2015-12-01 ENCOUNTER — Inpatient Hospital Stay (HOSPITAL_COMMUNITY): Payer: Medicare Other

## 2015-12-01 DIAGNOSIS — J189 Pneumonia, unspecified organism: Secondary | ICD-10-CM | POA: Diagnosis not present

## 2015-12-01 DIAGNOSIS — I5043 Acute on chronic combined systolic (congestive) and diastolic (congestive) heart failure: Secondary | ICD-10-CM | POA: Diagnosis not present

## 2015-12-01 DIAGNOSIS — R6520 Severe sepsis without septic shock: Secondary | ICD-10-CM | POA: Diagnosis not present

## 2015-12-01 DIAGNOSIS — I110 Hypertensive heart disease with heart failure: Secondary | ICD-10-CM | POA: Diagnosis not present

## 2015-12-01 DIAGNOSIS — J9622 Acute and chronic respiratory failure with hypercapnia: Secondary | ICD-10-CM | POA: Diagnosis not present

## 2015-12-01 DIAGNOSIS — G894 Chronic pain syndrome: Secondary | ICD-10-CM | POA: Diagnosis not present

## 2015-12-01 DIAGNOSIS — Z8249 Family history of ischemic heart disease and other diseases of the circulatory system: Secondary | ICD-10-CM | POA: Diagnosis not present

## 2015-12-01 DIAGNOSIS — R739 Hyperglycemia, unspecified: Secondary | ICD-10-CM | POA: Diagnosis not present

## 2015-12-01 DIAGNOSIS — Z7982 Long term (current) use of aspirin: Secondary | ICD-10-CM | POA: Diagnosis not present

## 2015-12-01 DIAGNOSIS — I482 Chronic atrial fibrillation: Secondary | ICD-10-CM | POA: Diagnosis not present

## 2015-12-01 DIAGNOSIS — J441 Chronic obstructive pulmonary disease with (acute) exacerbation: Secondary | ICD-10-CM | POA: Diagnosis not present

## 2015-12-01 DIAGNOSIS — H109 Unspecified conjunctivitis: Secondary | ICD-10-CM | POA: Diagnosis not present

## 2015-12-01 DIAGNOSIS — Z66 Do not resuscitate: Secondary | ICD-10-CM | POA: Diagnosis not present

## 2015-12-01 DIAGNOSIS — E872 Acidosis: Secondary | ICD-10-CM | POA: Diagnosis not present

## 2015-12-01 DIAGNOSIS — E785 Hyperlipidemia, unspecified: Secondary | ICD-10-CM | POA: Diagnosis not present

## 2015-12-01 DIAGNOSIS — E876 Hypokalemia: Secondary | ICD-10-CM | POA: Diagnosis not present

## 2015-12-01 DIAGNOSIS — J9621 Acute and chronic respiratory failure with hypoxia: Secondary | ICD-10-CM | POA: Diagnosis not present

## 2015-12-01 DIAGNOSIS — Z87891 Personal history of nicotine dependence: Secondary | ICD-10-CM | POA: Diagnosis not present

## 2015-12-01 DIAGNOSIS — Z23 Encounter for immunization: Secondary | ICD-10-CM | POA: Diagnosis not present

## 2015-12-01 DIAGNOSIS — J440 Chronic obstructive pulmonary disease with acute lower respiratory infection: Secondary | ICD-10-CM | POA: Diagnosis not present

## 2015-12-01 DIAGNOSIS — T380X5A Adverse effect of glucocorticoids and synthetic analogues, initial encounter: Secondary | ICD-10-CM | POA: Diagnosis not present

## 2015-12-01 DIAGNOSIS — A419 Sepsis, unspecified organism: Secondary | ICD-10-CM | POA: Diagnosis not present

## 2015-12-01 DIAGNOSIS — Z825 Family history of asthma and other chronic lower respiratory diseases: Secondary | ICD-10-CM | POA: Diagnosis not present

## 2015-12-01 LAB — BLOOD GAS, ARTERIAL
Acid-Base Excess: 5.7 mmol/L — ABNORMAL HIGH (ref 0.0–2.0)
BICARBONATE: 29.3 meq/L — AB (ref 20.0–24.0)
Drawn by: 317771
FIO2: 0.4
LHR: 12 {breaths}/min
O2 Saturation: 92.6 %
PEEP/CPAP: 5 cmH2O
TCO2: 15.6 mmol/L (ref 0–100)
VT: 600 mL
pCO2 arterial: 41.7 mmHg (ref 35.0–45.0)
pH, Arterial: 7.464 — ABNORMAL HIGH (ref 7.350–7.450)
pO2, Arterial: 64.1 mmHg — ABNORMAL LOW (ref 80.0–100.0)

## 2015-12-01 LAB — GLUCOSE, CAPILLARY
Glucose-Capillary: 112 mg/dL — ABNORMAL HIGH (ref 65–99)
Glucose-Capillary: 114 mg/dL — ABNORMAL HIGH (ref 65–99)
Glucose-Capillary: 89 mg/dL (ref 65–99)
Glucose-Capillary: 92 mg/dL (ref 65–99)
Glucose-Capillary: 94 mg/dL (ref 65–99)
Glucose-Capillary: 98 mg/dL (ref 65–99)

## 2015-12-01 LAB — CBC
HEMATOCRIT: 37.6 % — AB (ref 39.0–52.0)
Hemoglobin: 12 g/dL — ABNORMAL LOW (ref 13.0–17.0)
MCH: 30.3 pg (ref 26.0–34.0)
MCHC: 31.9 g/dL (ref 30.0–36.0)
MCV: 94.9 fL (ref 78.0–100.0)
PLATELETS: 185 10*3/uL (ref 150–400)
RBC: 3.96 MIL/uL — AB (ref 4.22–5.81)
RDW: 14 % (ref 11.5–15.5)
WBC: 8.4 10*3/uL (ref 4.0–10.5)

## 2015-12-01 LAB — CULTURE, RESPIRATORY W GRAM STAIN
Culture: NORMAL
Gram Stain: NONE SEEN

## 2015-12-01 LAB — BASIC METABOLIC PANEL
ANION GAP: 7 (ref 5–15)
BUN: 25 mg/dL — ABNORMAL HIGH (ref 6–20)
CALCIUM: 9 mg/dL (ref 8.9–10.3)
CO2: 29 mmol/L (ref 22–32)
Chloride: 108 mmol/L (ref 101–111)
Creatinine, Ser: 0.85 mg/dL (ref 0.61–1.24)
Glucose, Bld: 113 mg/dL — ABNORMAL HIGH (ref 65–99)
POTASSIUM: 4.4 mmol/L (ref 3.5–5.1)
Sodium: 144 mmol/L (ref 135–145)

## 2015-12-01 MED ORDER — FUROSEMIDE 10 MG/ML IJ SOLN
10.0000 mg | Freq: Two times a day (BID) | INTRAMUSCULAR | Status: DC
  Administered 2015-12-01 – 2015-12-02 (×3): 10 mg via INTRAVENOUS
  Filled 2015-12-01 (×3): qty 2

## 2015-12-01 MED ORDER — VITAL HIGH PROTEIN PO LIQD
1000.0000 mL | ORAL | Status: DC
  Administered 2015-12-01 (×4)
  Administered 2015-12-01: 1000 mL
  Administered 2015-12-02 (×2)
  Administered 2015-12-02: 1000 mL
  Administered 2015-12-02 (×2)
  Administered 2015-12-03: 1000 mL
  Administered 2015-12-04 (×5)
  Administered 2015-12-04: 1000 mL
  Administered 2015-12-04 (×3)
  Administered 2015-12-04: 1000 mL
  Administered 2015-12-04: 10:00:00
  Filled 2015-12-01 (×7): qty 1000

## 2015-12-01 MED ORDER — FAMOTIDINE 20 MG PO TABS
20.0000 mg | ORAL_TABLET | Freq: Every day | ORAL | Status: DC
  Administered 2015-12-01 – 2015-12-08 (×8): 20 mg
  Filled 2015-12-01 (×8): qty 1

## 2015-12-01 MED ORDER — PRO-STAT SUGAR FREE PO LIQD
30.0000 mL | Freq: Three times a day (TID) | ORAL | Status: DC
  Administered 2015-12-01 – 2015-12-05 (×13): 30 mL via ORAL
  Filled 2015-12-01 (×13): qty 30

## 2015-12-01 MED ORDER — ENOXAPARIN SODIUM 60 MG/0.6ML ~~LOC~~ SOLN: 60.0000 mg | SUBCUTANEOUS | Status: DC

## 2015-12-01 MED ORDER — ENOXAPARIN SODIUM 60 MG/0.6ML ~~LOC~~ SOLN
60.0000 mg | SUBCUTANEOUS | Status: DC
  Administered 2015-12-01 – 2015-12-08 (×7): 60 mg via SUBCUTANEOUS
  Filled 2015-12-01 (×8): qty 0.6

## 2015-12-01 NOTE — Progress Notes (Signed)
RT weaned Pt. The Pt's HR increased to 125-147. His BP increased also. Rt used the bed percussion 3 times on the Pt today, The Pt may need some ativan when we wean tomorrow

## 2015-12-01 NOTE — Progress Notes (Addendum)
Subjective: He had some more trouble with hypothermia yesterday that seems to be better now. He remains intubated on the ventilator. He is now down to 40% oxygen.  Objective: Vital signs in last 24 hours: Temp:  [95 F (35 C)-98 F (36.7 C)] 95.6 F (35.3 C) (01/10 0400) Pulse Rate:  [61-95] 82 (01/10 0500) Resp:  [11-18] 13 (01/10 0500) BP: (102-142)/(53-95) 121/68 mmHg (01/10 0500) SpO2:  [93 %-99 %] 93 % (01/10 0500) FiO2 (%):  [40 %-50 %] 40 % (01/10 0407) Weight:  [113.5 kg (250 lb 3.6 oz)] 113.5 kg (250 lb 3.6 oz) (01/10 0500) Weight change:     Intake/Output from previous day: 01/09 0701 - 01/10 0700 In: 2095.6 [I.V.:1895.6; IV Piggyback:200] Out: 1000 [Urine:1000]  PHYSICAL EXAM General appearance: Intubated sedated on mechanical ventilation. He is morbidly obese. Resp: rales bibasilar Cardio: irregularly irregular rhythm GI: soft, non-tender; bowel sounds normal; no masses,  no organomegaly Extremities: extremities normal, atraumatic, no cyanosis or edema  Lab Results:  Results for orders placed or performed during the hospital encounter of 11/28/15 (from the past 48 hour(s))  Blood gas, arterial     Status: Abnormal   Collection Time: 11/29/15  8:39 AM  Result Value Ref Range   FIO2 50.00    Delivery systems VENTILATOR    Mode PRESSURE REGULATED VOLUME CONTROL    VT 600 mL   LHR 20 resp/min   pH, Arterial 7.393 7.350 - 7.450   pCO2 arterial 52.7 (H) 35.0 - 45.0 mmHg   pO2, Arterial 40.4 (L) 80.0 - 100.0 mmHg   Bicarbonate 29.1 (H) 20.0 - 24.0 mEq/L   TCO2 12.7 0 - 100 mmol/L   Acid-Base Excess 6.6 (H) 0.0 - 2.0 mmol/L   O2 Saturation 75.7 %   Patient temperature 37.0    Collection site LEFT RADIAL    Drawn by [id number]    Sample type ARTERIAL    Allens test (pass/fail) PASS PASS  Glucose, capillary     Status: Abnormal   Collection Time: 11/29/15 11:31 AM  Result Value Ref Range   Glucose-Capillary 133 (H) 65 - 99 mg/dL  Blood gas, arterial     Status:  Abnormal   Collection Time: 11/29/15 12:20 PM  Result Value Ref Range   FIO2 60.00    Delivery systems VENTILATOR    Mode PRESSURE REGULATED VOLUME CONTROL    VT 600 mL   LHR 20 resp/min   Peep/cpap 5.0 cm H20   pH, Arterial 7.583 (H) 7.350 - 7.450   pCO2 arterial 33.3 (L) 35.0 - 45.0 mmHg   pO2, Arterial 87.8 80.0 - 100.0 mmHg   Bicarbonate 33.0 (H) 20.0 - 24.0 mEq/L   TCO2 16.3 0 - 100 mmol/L   Acid-Base Excess 8.8 (H) 0.0 - 2.0 mmol/L   O2 Saturation 98.2 %   Patient temperature 37.0    Collection site RIGHT RADIAL    Drawn by [id number]    Sample type ARTERIAL    Allens test (pass/fail) PASS PASS  Troponin I     Status: None   Collection Time: 11/29/15  1:12 PM  Result Value Ref Range   Troponin I <0.03 <0.031 ng/mL    Comment:        NO INDICATION OF MYOCARDIAL INJURY.   Culture, respiratory (NON-Expectorated)     Status: None (Preliminary result)   Collection Time: 11/29/15  3:18 PM  Result Value Ref Range   Specimen Description TRACHEAL ASPIRATE    Special Requests NONE  Gram Stain      NO WBC SEEN NO SQUAMOUS EPITHELIAL CELLS SEEN NO ORGANISMS SEEN Performed at [facility]    Culture NO GROWTH Performed at [facility]     Report Status PENDING   Glucose, capillary     Status: Abnormal   Collection Time: 11/29/15  3:36 PM  Result Value Ref Range   Glucose-Capillary 117 (H) 65 - 99 mg/dL  Glucose, capillary     Status: Abnormal   Collection Time: 11/29/15  7:56 PM  Result Value Ref Range   Glucose-Capillary 139 (H) 65 - 99 mg/dL   Comment 1 Notify RN   Glucose, capillary     Status: Abnormal   Collection Time: 11/30/15 12:58 AM  Result Value Ref Range   Glucose-Capillary 151 (H) 65 - 99 mg/dL  Blood gas, arterial     Status: Abnormal   Collection Time: 11/30/15  3:07 AM  Result Value Ref Range   FIO2 0.60    Delivery systems VENTILATOR    Mode PRESSURE REGULATED VOLUME CONTROL    VT 600 mL   LHR 16 resp/min   Peep/cpap 5.0 cm H20    pH, Arterial 7.541 (H) 7.350 - 7.450   pCO2 arterial 35.3 35.0 - 45.0 mmHg   pO2, Arterial 92.0 80.0 - 100.0 mmHg   Bicarbonate 31.3 (H) 20.0 - 24.0 mEq/L   Acid-Base Excess 7.1 (H) 0.0 - 2.0 mmol/L   O2 Saturation 97.8 %   Collection site RADIAL    Drawn by [id number]    Sample type ARTERIAL    Allens test (pass/fail) PASS PASS  Glucose, capillary     Status: Abnormal   Collection Time: 11/30/15  4:14 AM  Result Value Ref Range   Glucose-Capillary 140 (H) 65 - 99 mg/dL  CBC     Status: Abnormal   Collection Time: 11/30/15  4:23 AM  Result Value Ref Range   WBC 7.6 4.0 - 10.5 K/uL   RBC 3.68 (L) 4.22 - 5.81 MIL/uL   Hemoglobin 11.1 (L) 13.0 - 17.0 g/dL   HCT 34.3 (L) 39.0 - 52.0 %   MCV 93.2 78.0 - 100.0 fL   MCH 30.2 26.0 - 34.0 pg   MCHC 32.4 30.0 - 36.0 g/dL   RDW 13.6 11.5 - 15.5 %   Platelets 172 150 - 400 K/uL  Basic metabolic panel     Status: Abnormal   Collection Time: 11/30/15  4:23 AM  Result Value Ref Range   Sodium 141 135 - 145 mmol/L   Potassium 2.8 (L) 3.5 - 5.1 mmol/L    Comment: DELTA CHECK NOTED   Chloride 103 101 - 111 mmol/L   CO2 31 22 - 32 mmol/L   Glucose, Bld 173 (H) 65 - 99 mg/dL   BUN 24 (H) 6 - 20 mg/dL   Creatinine, Ser 1.08 0.61 - 1.24 mg/dL   Calcium 9.0 8.9 - 10.3 mg/dL   GFR calc non Af Amer >60 >60 mL/min   GFR calc Af Amer >60 >60 mL/min    Comment: (NOTE) The eGFR has been calculated using the CKD EPI equation. This calculation has not been validated in all clinical situations. eGFR's persistently <60 mL/min signify possible Chronic Kidney Disease.    Anion gap 7 5 - 15  Blood gas, arterial     Status: Abnormal   Collection Time: 11/30/15  6:19 AM  Result Value Ref Range   FIO2 0.60    Delivery systems VENTILATOR  Mode PRESSURE REGULATED VOLUME CONTROL    VT 600 mL   LHR 12 resp/min   Peep/cpap 5.0 cm H20   pH, Arterial 7.468 (H) 7.350 - 7.450   pCO2 arterial 43.5 35.0 - 45.0 mmHg   pO2, Arterial 94.9 80.0 - 100.0 mmHg    Bicarbonate 30.7 (H) 20.0 - 24.0 mEq/L   Acid-Base Excess 7.2 (H) 0.0 - 2.0 mmol/L   O2 Saturation 97.5 %   Collection site RADIAL    Drawn by [id number]    Sample type ARTERIAL    Allens test (pass/fail) PASS PASS  Glucose, capillary     Status: Abnormal   Collection Time: 11/30/15  8:08 AM  Result Value Ref Range   Glucose-Capillary 149 (H) 65 - 99 mg/dL  Glucose, capillary     Status: Abnormal   Collection Time: 11/30/15 10:57 AM  Result Value Ref Range   Glucose-Capillary 160 (H) 65 - 99 mg/dL  Glucose, capillary     Status: Abnormal   Collection Time: 11/30/15  4:09 PM  Result Value Ref Range   Glucose-Capillary 136 (H) 65 - 99 mg/dL  Glucose, capillary     Status: Abnormal   Collection Time: 11/30/15  7:10 PM  Result Value Ref Range   Glucose-Capillary 116 (H) 65 - 99 mg/dL  BMET today at 2000     Status: Abnormal   Collection Time: 11/30/15  7:45 PM  Result Value Ref Range   Sodium 144 135 - 145 mmol/L   Potassium 3.8 3.5 - 5.1 mmol/L    Comment: DELTA CHECK NOTED   Chloride 106 101 - 111 mmol/L   CO2 29 22 - 32 mmol/L   Glucose, Bld 131 (H) 65 - 99 mg/dL   BUN 24 (H) 6 - 20 mg/dL   Creatinine, Ser 0.92 0.61 - 1.24 mg/dL   Calcium 9.4 8.9 - 10.3 mg/dL   GFR calc non Af Amer >60 >60 mL/min   GFR calc Af Amer >60 >60 mL/min    Comment: (NOTE) The eGFR has been calculated using the CKD EPI equation. This calculation has not been validated in all clinical situations. eGFR's persistently <60 mL/min signify possible Chronic Kidney Disease.    Anion gap 9 5 - 15  Glucose, capillary     Status: Abnormal   Collection Time: 12/01/15 12:15 AM  Result Value Ref Range   Glucose-Capillary 112 (H) 65 - 99 mg/dL  Blood gas, arterial     Status: Abnormal   Collection Time: 12/01/15  4:16 AM  Result Value Ref Range   FIO2 0.40    Delivery systems VENTILATOR    Mode PRESSURE REGULATED VOLUME CONTROL    VT 600 mL   LHR 12 resp/min   Peep/cpap 5.0 cm H20   pH, Arterial 7.464  (H) 7.350 - 7.450   pCO2 arterial 41.7 35.0 - 45.0 mmHg   pO2, Arterial 64.1 (L) 80.0 - 100.0 mmHg   Bicarbonate 29.3 (H) 20.0 - 24.0 mEq/L   TCO2 15.6 0 - 100 mmol/L   Acid-Base Excess 5.7 (H) 0.0 - 2.0 mmol/L   O2 Saturation 92.6 %   Collection site RIGHT RADIAL    Drawn by [id number]    Sample type ARTERIAL DRAW    Allens test (pass/fail) PASS PASS  CBC     Status: Abnormal   Collection Time: 12/01/15  5:12 AM  Result Value Ref Range   WBC 8.4 4.0 - 10.5 K/uL   RBC 3.96 (L) 4.22 - 5.81 MIL/uL  Hemoglobin 12.0 (L) 13.0 - 17.0 g/dL   HCT 37.6 (L) 39.0 - 52.0 %   MCV 94.9 78.0 - 100.0 fL   MCH 30.3 26.0 - 34.0 pg   MCHC 31.9 30.0 - 36.0 g/dL   RDW 14.0 11.5 - 15.5 %   Platelets 185 150 - 400 K/uL  Basic metabolic panel     Status: Abnormal   Collection Time: 12/01/15  5:12 AM  Result Value Ref Range   Sodium 144 135 - 145 mmol/L   Potassium 4.4 3.5 - 5.1 mmol/L   Chloride 108 101 - 111 mmol/L   CO2 29 22 - 32 mmol/L   Glucose, Bld 113 (H) 65 - 99 mg/dL   BUN 25 (H) 6 - 20 mg/dL   Creatinine, Ser 0.85 0.61 - 1.24 mg/dL   Calcium 9.0 8.9 - 10.3 mg/dL   GFR calc non Af Amer >60 >60 mL/min   GFR calc Af Amer >60 >60 mL/min    Comment: (NOTE) The eGFR has been calculated using the CKD EPI equation. This calculation has not been validated in all clinical situations. eGFR's persistently <60 mL/min signify possible Chronic Kidney Disease.    Anion gap 7 5 - 15  Glucose, capillary     Status: None   Collection Time: 12/01/15  5:42 AM  Result Value Ref Range   Glucose-Capillary 92 65 - 99 mg/dL    ABGS  Recent Labs  12/01/15 0416  PHART 7.464*  PO2ART 64.1*  TCO2 15.6  HCO3 29.3*   CULTURES Recent Results (from the past 240 hour(s))  Blood culture (routine x 2)     Status: None (Preliminary result)   Collection Time: 11/28/15  9:25 PM  Result Value Ref Range Status   Specimen Description LEFT ANTECUBITAL  Final   Special Requests BOTTLES DRAWN AEROBIC ONLY 6CC ONLY   Final   Culture NO GROWTH 2 DAYS  Final   Report Status PENDING  Incomplete  Blood culture (routine x 2)     Status: None (Preliminary result)   Collection Time: 11/28/15  9:25 PM  Result Value Ref Range Status   Specimen Description LEFT ANTECUBITAL  Final   Special Requests BOTTLES DRAWN AEROBIC ONLY 6CC ONLY  Final   Culture NO GROWTH 2 DAYS  Final   Report Status PENDING  Incomplete  MRSA PCR Screening     Status: None   Collection Time: 11/29/15 12:46 AM  Result Value Ref Range Status   MRSA by PCR NEGATIVE NEGATIVE Final    Comment:        The GeneXpert MRSA Assay (FDA approved for NASAL specimens only), is one component of a comprehensive MRSA colonization surveillance program. It is not intended to diagnose MRSA infection nor to guide or monitor treatment for MRSA infections.   Culture, respiratory (NON-Expectorated)     Status: None (Preliminary result)   Collection Time: 11/29/15  3:18 PM  Result Value Ref Range Status   Specimen Description TRACHEAL ASPIRATE  Final   Special Requests NONE  Final   Gram Stain   Final    NO WBC SEEN NO SQUAMOUS EPITHELIAL CELLS SEEN NO ORGANISMS SEEN Performed at [facility]    Culture NO GROWTH Performed at [facility]   Final   Report Status PENDING  Incomplete   Studies/Results: Dg Chest Port 1 View  11/30/2015  CLINICAL DATA:  Respiratory failure EXAM: PORTABLE CHEST 1 VIEW COMPARISON:  Yesterday FINDINGS: Endotracheal tube tip is between the clavicular  heads and carina. Gastric suction tube is not visible below the thoracic inlet due to underpenetration. Increasing hazy basilar opacities, likely atelectasis and pleural fluid. Apparent increase could be from dependent flow. There could be underlying airspace opacity. No pulmonary edema seen in the apical lungs. Normal heart size and stable mediastinal contours. IMPRESSION: 1. Hazy basilar opacities favoring atelectasis and layering pleural fluid. 2.  Endotracheal tube remains in good position. Most of the orogastric tube is not visible due to technical factors. Electronically Signed   By: Monte Fantasia M.D.   On: 11/30/2015 09:08   Dg Chest Port 1 View  11/29/2015  CLINICAL DATA:  Respiratory failure EXAM: PORTABLE CHEST 1 VIEW COMPARISON:  Chest x-rays dated 11/28/2015 and 08/27/2015. FINDINGS: Endotracheal tube remains well positioned with tip just above the level of the carina. Enteric tube passes below the diaphragm. Mild cardiomegaly is unchanged. Overall cardiomediastinal silhouette is stable in size and configuration. Central pulmonary vascular congestion persists with mild bilateral interstitial edema, unchanged compared to yesterday's exam, significantly worsened compared to exam of 09/08/2015. Some component of the perihilar opacities likely related to chronic underlying pulmonary artery hypertension. Suspect additional mild bibasilar atelectasis and/or small pleural effusions. No new lung abnormality. IMPRESSION: 1. Overall stable appearance compared to chest x-ray of 11/28/2015. 2. Persistent central pulmonary vascular congestion and mild bilateral interstitial edema, not significantly changed in the short-term interval, suggesting mild volume overload/CHF. Patchy bibasilar airspace opacities are also unchanged, compatible with either atelectasis or pneumonia, favor atelectasis. Also suspect small bilateral pleural effusions. 3. Some component of the perihilar opacities likely related to chronic pulmonary artery enlargement caused by underlying pulmonary artery hypertension. 4. Endotracheal tube remains well positioned with tip just above the level of the carina. Electronically Signed   By: Franki Cabot M.D.   On: 11/29/2015 11:07    Medications:  Prior to Admission:  Prescriptions prior to admission  Medication Sig Dispense Refill Last Dose  . amLODipine (NORVASC) 10 MG tablet Take 10 mg by mouth daily.     Marland Kitchen diltiazem (CARDIZEM CD) 360  MG 24 hr capsule Take 1 capsule (360 mg total) by mouth daily. 30 capsule 6   . furosemide (LASIX) 20 MG tablet TAKE 1 TABLET (20 MG TOTAL) BY MOUTH DAILY. 30 tablet 11   . LORazepam (ATIVAN) 1 MG tablet Take 1 tablet (1 mg total) by mouth every 8 (eight) hours as needed for anxiety. 60 tablet 5   . metoprolol (LOPRESSOR) 50 MG tablet TAKE 1 TABLET (50 MG TOTAL) BY MOUTH DAILY. 30 tablet 5   . pravastatin (PRAVACHOL) 40 MG tablet TAKE 1 TABLET (40 MG TOTAL) BY MOUTH DAILY. 30 tablet 3   . tamsulosin (FLOMAX) 0.4 MG CAPS capsule Take 1 capsule by mouth daily.     . traMADol (ULTRAM) 50 MG tablet TAKE 1 TABLET BY MOUTH EVERY eight HOURS AS NEEDED FOR MODERATE PAIN 90 tablet 5   . albuterol (PROVENTIL HFA;VENTOLIN HFA) 108 (90 BASE) MCG/ACT inhaler Inhale 2 puffs into the lungs every 6 (six) hours as needed for wheezing or shortness of breath.   Unknown at Unknown time  . albuterol (PROVENTIL) (2.5 MG/3ML) 0.083% nebulizer solution Take 3 mLs (2.5 mg total) by nebulization every 6 (six) hours as needed for wheezing or shortness of breath. 75 mL 12 Unknown at Unknown time  . aspirin 81 MG EC tablet Take 81 mg by mouth daily.  0 Unknown at Unknown time  . budesonide-formoterol (SYMBICORT) 160-4.5 MCG/ACT inhaler Inhale 2 puffs  into the lungs 2 (two) times daily. 1 Inhaler 5 Unknown at Unknown time  . guaiFENesin (MUCINEX) 600 MG 12 hr tablet Take 2 tablets (1,200 mg total) by mouth 2 (two) times daily. 20 tablet 1 Unknown at Unknown time   Scheduled: . albuterol  2.5 mg Nebulization Q6H  . antiseptic oral rinse  7 mL Mouth Rinse QID  . aspirin EC  81 mg Oral Daily  . chlorhexidine gluconate  15 mL Mouth Rinse BID  . heparin  5,000 Units Subcutaneous 3 times per day  . insulin aspart  0-15 Units Subcutaneous 6 times per day  . insulin glargine  7 Units Subcutaneous BID  . methylPREDNISolone (SOLU-MEDROL) injection  125 mg Intravenous Once  . methylPREDNISolone (SOLU-MEDROL) injection  40 mg  Intravenous Q6H  . piperacillin-tazobactam (ZOSYN)  IV  3.375 g Intravenous Q8H  . pneumococcal 23 valent vaccine  0.5 mL Intramuscular Tomorrow-1000  . sodium chloride  3 mL Intravenous Q12H  . vancomycin  750 mg Intravenous Q12H   Continuous: . dextrose 5 % and 0.9 % NaCl with KCl 20 mEq/L 70 mL/hr at 12/01/15 0600  . midazolam (VERSED) infusion Stopped (11/30/15 0300)  . propofol (DIPRIVAN) infusion 40 mcg/kg/min (12/01/15 0600)   JSE:[id number], fentaNYL (SUBLIMAZE) injection, metoprolol  Assesment: He was admitted with acute on chronic hypercapnic/hypoxic respiratory failure. This is related to bilateral pneumonia and COPD exacerbation. He was septic on admission and all of that has improved. He is now on 40% oxygen. He may be able to wean and extubate today.  He has a history of chronic atrial fibrillation and this is fairly stable  He has a history of chronic combined systolic and diastolic heart failure and his Lasix has been held because of relatively soft blood pressures. His chest x-ray is consistent with bilateral pleural effusions and if his blood pressure will allow it we may need to get him back on diuretics  He probably has some element of obesity hypoventilation at baseline Principal Problem:   Acute respiratory failure with hypercapnia (HCC) Active Problems:   Obesity   Chronic pain syndrome   Chronic combined systolic and diastolic congestive heart failure (HCC)   Chronic atrial fibrillation (HCC)   COPD mixed type (HCC)   Bilateral pneumonia   Sepsis ([place])   Hypothermia   Hyperglycemia, drug-induced   Morbid obesity ([place])   Hypokalemia    Plan: Attempt weaning today    LOS: 3 days   Dickie Cloe L 12/01/2015, 7:42 AM

## 2015-12-01 NOTE — Progress Notes (Signed)
Nutrition Follow-up  DOCUMENTATION CODES:   Obesity unspecified  INTERVENTION:  Continuous Vital HP @ 10 ml/hr via OGT and add 60 ml Prostat TID  Tube feeding regimen provides 840 kcal, 111 gr grams of protein, and 200 ml of H2O. Provides 1545 total kcal including propofol.  NUTRITION DIAGNOSIS:   Inadequate oral intake related to inability to eat as evidenced by NPO status.  GOAL:   Provide needs based on ASPEN/SCCM guidelines   MONITOR:   Vent status, Weight trends, Labs, I & O's  REASON FOR ASSESSMENT:   Ventilator    ASSESSMENT:  Patient remains intubated on ventilator support. He may be able to wean later per MD. TF has been started with protein modular. His sedative rate is the same as yesterday. Therefore no change in recommendations. MV: 13 L/min Temp (24hrs), Avg:96.4 F (35.8 C), Min:95 F (35 C), Max:98.9 F (37.2 C)  Propofol: 26.7 ml/hr provides 705 kcal every 24 hr at current rate   Labs: potassium 2.8, BUN 24, glucose 160 today.   Diet Order:  Diet NPO time specified  Skin:   dry, intact  Last BM:    distended abdomen per nursing, last BM prior to admission  Height:   Ht Readings from Last 1 Encounters:  11/30/15 6' (1.829 m)    Weight:   Wt Readings from Last 1 Encounters:  12/01/15 250 lb 3.6 oz (113.5 kg)  Usual weight range (108-111 kg)  Ideal Body Weight:  80.9 kg  BMI:  Body mass index is 33.93 kg/(m^2).  Estimated Nutritional Needs:   Kcal:  1566  Protein: 122 gr  Fluid:  1.6 liters daily  EDUCATION NEEDS:   No education needs identified at this time  Royann ShiversLynn Jessel Gettinger MS,RD,CSG,LDN Office: #[phone] Pager: [phone]

## 2015-12-01 NOTE — Progress Notes (Signed)
[facility] PROGRESS NOTE  Jasun Logan West ZOX:[id number] DOB: [birth year] DOA: 11/28/2015 PCP: Mechele Claude, MD   Code Status: Full code Family Communication: family not available  Disposition Plan: discharge when clinically appropriate   Consultants:  Pulmonology  Procedures:  Intubation and mechanical ventilation started in the ED  Antibiotics:  Zosyn 11/29/15 >>  Vancomycin 11/29/15 >>  HPI/Subjective: Nursing reports hypothermia and   Ob bear hugger was reapplied.jectiveCeasar Mons Vitals:   12/01/15 0400 12/01/15 0500  BP: 123/75 121/68  Pulse: 95 82  Temp: 95.6 F (35.3 C)   Resp: 13 13   oxygen saturation on the ventilator 93% on the ventilator. temperature 95.6.   Intake/Output Summary (Last 24 hours) at 12/01/15 0758 Last data filed at 12/01/15 0600  Gross per 24 hour  Intake 2095.63 ml  Output   1000 ml  Net 1095.63 ml   Filed Weights   11/29/15 0045 11/29/15 0500 12/01/15 0500  Weight: 111.7 kg (246 lb 4.1 oz) 111.2 kg (245 lb 2.4 oz) 113.5 kg (250 lb 3.6 oz)    Exam:   General:  morbidly obese 82 year old Caucasian man on the ventilator. He is sedated on propofol.  Cardiovascular: distant irregular-irregular   Respiratory: bilateral/bibasilar crackles; decreased breath sounds in the bases. Mechanically ventilated.  Abdomen: morbidly obese, hypoactive bowel sounds, soft, no obvious tenderness.  Musculoskeletal/extremities: Trace to 1+ pedal edema.  Neurologic: The patient is sedated. Whole-body tremor secondary to anxiety has resolved on propofol.  Data Reviewed: Basic Metabolic Panel:  Recent Labs Lab 11/28/15 2125 11/30/15 0423 11/30/15 1945 12/01/15 0512  NA 142 141 144 144  K 5.0 2.8* 3.8 4.4  CL 98* 103 106 108  CO2 36* 31 29 29   GLUCOSE 181* 173* 131* 113*  BUN 19 24* 24* 25*  CREATININE 1.12 1.08 0.92 0.85  CALCIUM 9.1 9.0 9.4 9.0   Liver Function Tests:  Recent Labs Lab 11/28/15 2125  AST 18  ALT 13*  ALKPHOS 75   BILITOT 1.6*  PROT 6.8  ALBUMIN 3.4*   No results for input(s): LIPASE, AMYLASE in the last 168 hours. No results for input(s): AMMONIA in the last 168 hours. CBC:  Recent Labs Lab 11/28/15 2125 11/30/15 0423 12/01/15 0512  WBC 7.3 7.6 8.4  NEUTROABS 6.5  --   --   HGB 12.6* 11.1* 12.0*  HCT 40.2 34.3* 37.6*  MCV 97.6 93.2 94.9  PLT 159 172 185   Cardiac Enzymes:  Recent Labs Lab 11/28/15 2125 11/29/15 0121 11/29/15 0643 11/29/15 1312  TROPONINI <0.03 <0.03 <0.03 <0.03   BNP (last 3 results)  Recent Labs  08/27/15 2301 11/04/15 1410 11/28/15 2125  BNP 160.6* 126.5* 128.0*    ProBNP (last 3 results) No results for input(s): PROBNP in the last 8760 hours.  CBG:  Recent Labs Lab 11/30/15 1057 11/30/15 1609 11/30/15 1910 12/01/15 0015 12/01/15 0542  GLUCAP 160* 136* 116* 112* 92    Recent Results (from the past 240 hour(s))  Blood culture (routine x 2)     Status: None (Preliminary result)   Collection Time: 11/28/15  9:25 PM  Result Value Ref Range Status   Specimen Description LEFT ANTECUBITAL  Final   Special Requests BOTTLES DRAWN AEROBIC ONLY 6CC ONLY  Final   Culture NO GROWTH 2 DAYS  Final   Report Status PENDING  Incomplete  Blood culture (routine x 2)     Status: None (Preliminary result)   Collection Time: 11/28/15  9:25 PM  Result Value Ref Range Status  Specimen Description LEFT ANTECUBITAL  Final   Special Requests BOTTLES DRAWN AEROBIC ONLY 6CC ONLY  Final   Culture NO GROWTH 2 DAYS  Final   Report Status PENDING  Incomplete  MRSA PCR Screening     Status: None   Collection Time: 11/29/15 12:46 AM  Result Value Ref Range Status   MRSA by PCR NEGATIVE NEGATIVE Final    Comment:        The GeneXpert MRSA Assay (FDA approved for NASAL specimens only), is one component of a comprehensive MRSA colonization surveillance program. It is not intended to diagnose MRSA infection nor to guide or monitor treatment for MRSA  infections.   Culture, respiratory (NON-Expectorated)     Status: None (Preliminary result)   Collection Time: 11/29/15  3:18 PM  Result Value Ref Range Status   Specimen Description TRACHEAL ASPIRATE  Final   Special Requests NONE  Final   Gram Stain   Final    NO WBC SEEN NO SQUAMOUS EPITHELIAL CELLS SEEN NO ORGANISMS SEEN Performed at [facility]    Culture NO GROWTH Performed at [facility]   Final   Report Status PENDING  Incomplete     Studies: Dg Chest Port 1 View  12/01/2015  CLINICAL DATA:  82 year old male currently ventilated EXAM: PORTABLE CHEST 1 VIEW COMPARISON:  Prior chest x-ray 11/30/2015 FINDINGS: The endotracheal tube is 6.2 cm above the carina. A nasogastric tube is present, the tip is not imaged but lies below the diaphragm likely within the stomach. Stable cardiac and mediastinal contours. Atherosclerotic calcifications again noted in the transverse aorta. Improved aeration with decreased bibasilar opacities likely reflecting decreasing atelectasis. There are persistent bilateral moderate layering pleural effusions. No pulmonary edema. No pneumothorax. No acute osseous abnormality. IMPRESSION: 1. Improved aeration in both lung bases likely secondary to decreasing atelectasis. 2. Persistent bilateral moderate layering pleural effusions. 3. Stable and satisfactory support apparatus. Electronically Signed   By: Malachy Moan M.D.   On: 12/01/2015 07:47   Dg Chest Port 1 View  11/30/2015  CLINICAL DATA:  Respiratory failure EXAM: PORTABLE CHEST 1 VIEW COMPARISON:  Yesterday FINDINGS: Endotracheal tube tip is between the clavicular heads and carina. Gastric suction tube is not visible below the thoracic inlet due to underpenetration. Increasing hazy basilar opacities, likely atelectasis and pleural fluid. Apparent increase could be from dependent flow. There could be underlying airspace opacity. No pulmonary edema seen in the apical lungs. Normal heart  size and stable mediastinal contours. IMPRESSION: 1. Hazy basilar opacities favoring atelectasis and layering pleural fluid. 2. Endotracheal tube remains in good position. Most of the orogastric tube is not visible due to technical factors. Electronically Signed   By: Marnee Spring M.D.   On: 11/30/2015 09:08   Dg Chest Port 1 View  11/29/2015  CLINICAL DATA:  Respiratory failure EXAM: PORTABLE CHEST 1 VIEW COMPARISON:  Chest x-rays dated 11/28/2015 and 08/27/2015. FINDINGS: Endotracheal tube remains well positioned with tip just above the level of the carina. Enteric tube passes below the diaphragm. Mild cardiomegaly is unchanged. Overall cardiomediastinal silhouette is stable in size and configuration. Central pulmonary vascular congestion persists with mild bilateral interstitial edema, unchanged compared to yesterday's exam, significantly worsened compared to exam of 09/08/2015. Some component of the perihilar opacities likely related to chronic underlying pulmonary artery hypertension. Suspect additional mild bibasilar atelectasis and/or small pleural effusions. No new lung abnormality. IMPRESSION: 1. Overall stable appearance compared to chest x-ray of 11/28/2015. 2. Persistent central pulmonary vascular congestion  and mild bilateral interstitial edema, not significantly changed in the short-term interval, suggesting mild volume overload/CHF. Patchy bibasilar airspace opacities are also unchanged, compatible with either atelectasis or pneumonia, favor atelectasis. Also suspect small bilateral pleural effusions. 3. Some component of the perihilar opacities likely related to chronic pulmonary artery enlargement caused by underlying pulmonary artery hypertension. 4. Endotracheal tube remains well positioned with tip just above the level of the carina. Electronically Signed   By: Bary Richard M.D.   On: 11/29/2015 11:07    Scheduled Meds: . albuterol  2.5 mg Nebulization Q6H  . antiseptic oral rinse  7  mL Mouth Rinse QID  . aspirin EC  81 mg Oral Daily  . chlorhexidine gluconate  15 mL Mouth Rinse BID  . heparin  5,000 Units Subcutaneous 3 times per day  . insulin aspart  0-15 Units Subcutaneous 6 times per day  . insulin glargine  7 Units Subcutaneous BID  . methylPREDNISolone (SOLU-MEDROL) injection  125 mg Intravenous Once  . methylPREDNISolone (SOLU-MEDROL) injection  40 mg Intravenous Q6H  . piperacillin-tazobactam (ZOSYN)  IV  3.375 g Intravenous Q8H  . pneumococcal 23 valent vaccine  0.5 mL Intramuscular Tomorrow-1000  . sodium chloride  3 mL Intravenous Q12H  . vancomycin  750 mg Intravenous Q12H   Continuous Infusions: . dextrose 5 % and 0.9 % NaCl with KCl 20 mEq/L 70 mL/hr at 12/01/15 0600  . midazolam (VERSED) infusion Stopped (11/30/15 0300)  . propofol (DIPRIVAN) infusion 40 mcg/kg/min (12/01/15 0600)   Assessment and plan: Principal Problem:   Acute respiratory failure with hypercapnia (HCC) Active Problems:   Bilateral pneumonia   Sepsis (HCC)   COPD mixed type (HCC)   Hypothermia   Obesity   Chronic pain syndrome   Chronic combined systolic and diastolic congestive heart failure (HCC)   Chronic atrial fibrillation (HCC)   Hyperglycemia, drug-induced   Morbid obesity (HCC)   Hypokalemia    1. Acute respiratory failure with hypercapnia and respiratory acidosis, secondary to bilateral pneumonia. -The patient's admission chest x-ray revealed bilateral patches airspace consolidation, likely from multifocal pneumonia. Pulmonary edema was considered, but the patient's BNP was only modestly elevated. He was hypothermic on admission with temperature of 95.5. He was intubated on arrival to the ED. Initial ABG on the ventilator revealed a pH of 7.2, PCO2 of 90, and PO2 of 262. -We'll continue Zosyn, vancomycin, and Solu-Medrol. Will continue albuterol nebulizer; Atrovent nebulizer was added. -Propofol was later added for his agitation; now resolved. -Pulmonologist, Dr.  Juanetta Gosling has been consulted. He made ventilator changers with respiratory therapy. Patient's ABG this morning shows a PO2 of 64 on 40% FiO2 which has decreased from yesterday. Defer further ventilatior changes to Dr. Juanetta Gosling.  -Patient's chest x-ray today reveals improved aeration, but moderate  layering pleural effusions; query peripneumonic effusions versus mild CHF exacerbation.  Early sepsis with lactic acidosis, secondary to bilateral pneumonia. The patient's blood pressure and lactic acid were initially within normal limits. However, he was hypothermic on admission. His lactic acid increased to 3.7. He did come hypotensive following intubation, but that may have been from the mechanics of intubation. Blood cultures were ordered and are negative to date. Tracheal aspirate culture reveals oropharyngeal flora. -The patient became hypothermic again, but he appears to be improving some. His white blood cell count is within normal limits. His blood pressure is better. -Warming blanket or bear hugger reapplied. -Will order a follow-up lactic acid level. -We'll continue management as stated above #1.  COPD.  I do not hear any wheezing on exam.   As above, will continue antibiotics, IV Solu-Medrol, and duonebs  Chronic combined systolic and diastolic heart failure; mild acute on chronic from volume resuscitation.  Patient's chest x-ray on admission showed bilateral patchy airspace consolidation, more likely from pneumonia or than from acute CHF. His BNP on admission was 128 which was only marginally elevated, but could be falsely low in this obese patient. Per home medication review, he is treated with furosemide, metoprolol and aspirin. All were on hold secondary to intubation initially. His chest x-ray on 1/10 shows moderate bilateral layering effusion which could be secondary to parapneumonic effusions or exacerbation of CHF. -We'll restart Lasix and give IV 10 mg every 12 hours. Aspirin restarted  per tube. -We'll decrease IV fluids.   Chronic atrial fibrillation Apparently, the patient declined anticoagulation in the past. Is treated with metoprolol for rate control. His heart rate is within normal limits. However, will add when necessary metoprolol for heart rate sustained greater than 110. His TSH is within normal limits.  Hypokalemia. Patient's serum sodium was 5.0 on admission, but had fallen to 2.9. Potassium chloride via the NG tube has been ordered. -Potassium was added to the IV fluids.   Steroid-induced hyperglycemia. Patient was started on IV Solu-Medrol with an increase in his capillary blood glucose. He was also started on dextrose in his IV fluids. -Sliding scale NovoLog and then Lantus were ordered-we'll continue.  His CBGs have been reasonable.   Nutrition. Registered dietitian recommendations noted. Will start tube feedings with Vital HP and Prostat TID via the OG  DVT prophylaxis. Patient is on SCDs, but will prophylactic Lovenox.  GI prophylaxis. We'll add per tube Pepcid.   Time spent: Critical care time 35 minutes    [facility] Healthcare GraylingFISHER,Luda Charbonneau  [facility] Pager [phone]. If 7PM-7AM, please contact night-coverage at [email or web address], password [email or web address] 12/01/2015, 7:58 AM  LOS: 3 days

## 2015-12-02 ENCOUNTER — Inpatient Hospital Stay (HOSPITAL_COMMUNITY): Payer: Medicare Other

## 2015-12-02 DIAGNOSIS — I482 Chronic atrial fibrillation: Secondary | ICD-10-CM

## 2015-12-02 DIAGNOSIS — J9602 Acute respiratory failure with hypercapnia: Secondary | ICD-10-CM

## 2015-12-02 DIAGNOSIS — H109 Unspecified conjunctivitis: Secondary | ICD-10-CM

## 2015-12-02 DIAGNOSIS — I5042 Chronic combined systolic (congestive) and diastolic (congestive) heart failure: Secondary | ICD-10-CM

## 2015-12-02 DIAGNOSIS — J189 Pneumonia, unspecified organism: Secondary | ICD-10-CM

## 2015-12-02 LAB — BLOOD GAS, ARTERIAL
ACID-BASE EXCESS: 4.2 mmol/L — AB (ref 0.0–2.0)
Bicarbonate: 27.7 mEq/L — ABNORMAL HIGH (ref 20.0–24.0)
DRAWN BY: 213101
FIO2: 0.4
MECHVT: 600 mL
O2 Saturation: 94.6 %
PCO2 ART: 45.7 mmHg — AB (ref 35.0–45.0)
PEEP: 5 cmH2O
PH ART: 7.412 (ref 7.350–7.450)
RATE: 12 resp/min
TCO2: 16.5 mmol/L (ref 0–100)
pO2, Arterial: 76.5 mmHg — ABNORMAL LOW (ref 80.0–100.0)

## 2015-12-02 LAB — BASIC METABOLIC PANEL
Anion gap: 7 (ref 5–15)
BUN: 35 mg/dL — AB (ref 6–20)
CALCIUM: 8.9 mg/dL (ref 8.9–10.3)
CO2: 28 mmol/L (ref 22–32)
CREATININE: 1.01 mg/dL (ref 0.61–1.24)
Chloride: 109 mmol/L (ref 101–111)
GFR calc Af Amer: >60 mL/min (ref >60)
GLUCOSE: 138 mg/dL — AB (ref 65–99)
POTASSIUM: 4.5 mmol/L (ref 3.5–5.1)
Sodium: 144 mmol/L (ref 135–145)

## 2015-12-02 LAB — GLUCOSE, CAPILLARY
GLUCOSE-CAPILLARY: 117 mg/dL — AB (ref 65–99)
GLUCOSE-CAPILLARY: 112 mg/dL — AB (ref 65–99)
GLUCOSE-CAPILLARY: 123 mg/dL — AB (ref 65–99)
Glucose-Capillary: 119 mg/dL — ABNORMAL HIGH (ref 65–99)
Glucose-Capillary: 130 mg/dL — ABNORMAL HIGH (ref 65–99)
Glucose-Capillary: 122 mg/dL — ABNORMAL HIGH (ref 65–99)

## 2015-12-02 LAB — CBC
HCT: 40.9 % (ref 39.0–52.0)
Hemoglobin: 12.9 g/dL — ABNORMAL LOW (ref 13.0–17.0)
MCH: 30.4 pg (ref 26.0–34.0)
MCHC: 31.5 g/dL (ref 30.0–36.0)
MCV: 96.5 fL (ref 78.0–100.0)
PLATELETS: 151 10*3/uL (ref 150–400)
RBC: 4.24 MIL/uL (ref 4.22–5.81)
RDW: 14 % (ref 11.5–15.5)
WBC: 5.4 10*3/uL (ref 4.0–10.5)

## 2015-12-02 LAB — LACTIC ACID, PLASMA: LACTIC ACID, VENOUS: 1.3 mmol/L (ref 0.5–2.0)

## 2015-12-02 MED ORDER — FUROSEMIDE 10 MG/ML IJ SOLN
40.0000 mg | Freq: Two times a day (BID) | INTRAMUSCULAR | Status: DC
  Administered 2015-12-02 – 2015-12-07 (×10): 40 mg via INTRAVENOUS
  Filled 2015-12-02 (×10): qty 4

## 2015-12-02 MED ORDER — METHYLPREDNISOLONE SODIUM SUCC 40 MG IJ SOLR
40.0000 mg | Freq: Two times a day (BID) | INTRAMUSCULAR | Status: DC
  Administered 2015-12-02 – 2015-12-03 (×2): 40 mg via INTRAVENOUS
  Filled 2015-12-02 (×2): qty 1

## 2015-12-02 MED ORDER — LORAZEPAM 2 MG/ML IJ SOLN
1.0000 mg | INTRAMUSCULAR | Status: DC | PRN
  Administered 2015-12-02 – 2015-12-05 (×5): 1 mg via INTRAVENOUS
  Filled 2015-12-02 (×5): qty 1

## 2015-12-02 MED ORDER — ERYTHROMYCIN 5 MG/GM OP OINT
TOPICAL_OINTMENT | Freq: Three times a day (TID) | OPHTHALMIC | Status: AC
  Administered 2015-12-02 – 2015-12-04 (×8): via OPHTHALMIC
  Administered 2015-12-05: 1 via OPHTHALMIC
  Administered 2015-12-05 – 2015-12-06 (×3): via OPHTHALMIC
  Administered 2015-12-06: 1 via OPHTHALMIC
  Administered 2015-12-07: 06:00:00 via OPHTHALMIC
  Administered 2015-12-07: 1 via OPHTHALMIC
  Filled 2015-12-02: qty 3.5

## 2015-12-02 NOTE — Progress Notes (Signed)
ANTIBIOTIC CONSULT NOTE - follow up  Pharmacy Consult for vancomycin and zosyn 1/7 >> Indication: pneumonia  No Known Allergies  Patient Measurements: Height: 6' (182.9 cm) Weight: 251 lb 15.8 oz (114.3 kg) IBW/kg (Calculated) : 77.6  Vital Signs: Temp: 97.4 F (36.3 C) (01/11 0807) Temp Source: Axillary (01/11 0807) BP: 138/58 mmHg (01/11 0830) Pulse Rate: 70 (01/11 0830) Intake/Output from previous day: 01/10 0701 - 01/11 0700 In: 3192.8 [I.V.:2339.9; NG/GT:252.8; IV Piggyback:600] Out: 650 [Urine:650] Intake/Output from this shift: Total I/O In: -  Out: 1325 [Urine:1325]  Labs:  Recent Labs  11/30/15 0423 11/30/15 1945 12/01/15 0512 12/02/15 0438  WBC 7.6  --  8.4 5.4  HGB 11.1*  --  12.0* 12.9*  PLT 172  --  185 151  CREATININE 1.08 0.92 0.85 1.01   Estimated Creatinine Clearance: 77.4 mL/min (by C-G formula based on Cr of 1.01). No results for input(s): VANCOTROUGH, VANCOPEAK, VANCORANDOM, GENTTROUGH, GENTPEAK, GENTRANDOM, TOBRATROUGH, TOBRAPEAK, TOBRARND, AMIKACINPEAK, AMIKACINTROU, AMIKACIN in the last 72 hours.   Microbiology: Recent Results (from the past 720 hour(s))  Blood culture (routine x 2)     Status: None (Preliminary result)   Collection Time: 11/28/15  9:25 PM  Result Value Ref Range Status   Specimen Description LEFT ANTECUBITAL  Final   Special Requests BOTTLES DRAWN AEROBIC ONLY 6CC ONLY  Final   Culture NO GROWTH 2 DAYS  Final   Report Status PENDING  Incomplete  Blood culture (routine x 2)     Status: None (Preliminary result)   Collection Time: 11/28/15  9:25 PM  Result Value Ref Range Status   Specimen Description LEFT ANTECUBITAL  Final   Special Requests BOTTLES DRAWN AEROBIC ONLY 6CC ONLY  Final   Culture NO GROWTH 2 DAYS  Final   Report Status PENDING  Incomplete  MRSA PCR Screening     Status: None   Collection Time: 11/29/15 12:46 AM  Result Value Ref Range Status   MRSA by PCR NEGATIVE NEGATIVE Final    Comment:         The GeneXpert MRSA Assay (FDA approved for NASAL specimens only), is one component of a comprehensive MRSA colonization surveillance program. It is not intended to diagnose MRSA infection nor to guide or monitor treatment for MRSA infections.   Culture, respiratory (NON-Expectorated)     Status: None   Collection Time: 11/29/15  3:18 PM  Result Value Ref Range Status   Specimen Description TRACHEAL ASPIRATE  Final   Special Requests NONE  Final   Gram Stain   Final    NO WBC SEEN NO SQUAMOUS EPITHELIAL CELLS SEEN NO ORGANISMS SEEN Performed at [facility]    Culture   Final    NORMAL OROPHARYNGEAL FLORA Performed at [facility]    Report Status 12/01/2015 FINAL  Final   Medical History: Past Medical History  Diagnosis Date  . Hypertension     x30 years  . Hyperlipidemia     x 7-8  . COPD (chronic obstructive pulmonary disease) (HCC)   . Obesity   . Chronic pain   . CHF (congestive heart failure) (HCC)   . Atrial fibrillation (HCC)    Medications:  See medication history Assessment: 82 yo man to start broad spectrum antibiotics for PNA.  MRSA pcr (-).  Afebrile, WBC normal.  Cultures pending.  Goal of Therapy:  Vancomycin trough level 15-20 mcg/ml  Plan:  Vancomycin 750mg  IV q12hrs Trough level at steady state. Zosyn 3.375 gm IV  q8 hours, EID F/u cultures, renal fxn, and progress Deescalate ABX when improved / appropriate (consider d/c Vanc MRSA pcr (-)  Thanks for allowing pharmacy to be a part of this patient's care.  Valrie HartScott Yahmir Sokolov, PharmD Clinical Pharmacist  12/02/2015,9:16 AM

## 2015-12-02 NOTE — Progress Notes (Addendum)
[facility] PROGRESS NOTE  Logan West ZOX:[id number] DOB: 1934-11-11 DOA: 11/28/2015 PCP: Mechele Claude, MD  Assessment/Plan: Acute, Ventilatory Dependant Respiratory Failure with Hypercapnea -2/2 HCAP and COPD. -Appreciate pulmonary input and recommendations. -Will attempt weaning again today; failed weaning on 1/10 due to elevated HR and agitation. -Start steroid titration.  HCAP -Will DC Vanc given negative MRSA PCR. -Continue zosyn to complete 8 days of treatment (DC next 24-48 hours).  COPD -No wheezes on exam. -Start steroid titration.  Acute on Chronic Combined CHF -ECHO 9/16: EF 45-50% with diffuse hypokinesis and high ventricular filling pressures. -Significantly volume overloaded on exam. -Increase lasix to 40 mg IV BID, strive for negative fluid balance.  A Fib -Chronic. -Per charts, has refused anticoagulation in past.  Hyperglycemia -Steroid-Induced. -Well controlled. -Continue steroid titration.  Right Conjunctivitis -With purulent discharge, likely bacterial. -Erythromycin ointment for 5 days.    Code Status: Full Code Family Communication: Patient only  Disposition Plan: Attempt weaning today; keep in ICU   Consultants:  Pulmonary   Antibiotics:  Zosyn   Subjective: Agitation off, awake, a little aggressive.  Objective: Filed Vitals:   12/02/15 0845 12/02/15 0900 12/02/15 0915 12/02/15 0930  BP: 137/72 146/64 145/70 141/67  Pulse: 82 99 102 94  Temp:      TempSrc:      Resp: 21 18 21 21   Height:      Weight:      SpO2: 96% 97% 96% 88%    Intake/Output Summary (Last 24 hours) at 12/02/15 0952 Last data filed at 12/02/15 0847  Gross per 24 hour  Intake 2999.36 ml  Output   1975 ml  Net 1024.36 ml   Filed Weights   11/29/15 0500 12/01/15 0500 12/02/15 0500  Weight: 111.2 kg (245 lb 2.4 oz) 113.5 kg (250 lb 3.6 oz) 114.3 kg (251 lb 15.8 oz)    Exam:   General:  Awake, still intubated, right eye redness with  purulent discharge.  Cardiovascular: Tachy, irregular  Respiratory: Coarse bilateral BS, no wheezing  Abdomen: S/NT/ND/+BS  Extremities: 3++ edema bilaterally, +pulses   Neurologic:  Moves all 4 spontaneously  Data Reviewed: Basic Metabolic Panel:  Recent Labs Lab 11/28/15 2125 11/30/15 0423 11/30/15 1945 12/01/15 0512 12/02/15 0438  NA 142 141 144 144 144  K 5.0 2.8* 3.8 4.4 4.5  CL 98* 103 106 108 109  CO2 36* 31 29 29 28   GLUCOSE 181* 173* 131* 113* 138*  BUN 19 24* 24* 25* 35*  CREATININE 1.12 1.08 0.92 0.85 1.01  CALCIUM 9.1 9.0 9.4 9.0 8.9   Liver Function Tests:  Recent Labs Lab 11/28/15 2125  AST 18  ALT 13*  ALKPHOS 75  BILITOT 1.6*  PROT 6.8  ALBUMIN 3.4*   No results for input(s): LIPASE, AMYLASE in the last 168 hours. No results for input(s): AMMONIA in the last 168 hours. CBC:  Recent Labs Lab 11/28/15 2125 11/30/15 0423 12/01/15 0512 12/02/15 0438  WBC 7.3 7.6 8.4 5.4  NEUTROABS 6.5  --   --   --   HGB 12.6* 11.1* 12.0* 12.9*  HCT 40.2 34.3* 37.6* 40.9  MCV 97.6 93.2 94.9 96.5  PLT 159 172 185 151   Cardiac Enzymes:  Recent Labs Lab 11/28/15 2125 11/29/15 0121 11/29/15 0643 11/29/15 1312  TROPONINI <0.03 <0.03 <0.03 <0.03   BNP (last 3 results)  Recent Labs  08/27/15 2301 11/04/15 1410 11/28/15 2125  BNP 160.6* 126.5* 128.0*    ProBNP (last 3 results) No results  for input(s): PROBNP in the last 8760 hours.  CBG:  Recent Labs Lab 12/01/15 1632 12/01/15 1943 12/02/15 0105 12/02/15 0508 12/02/15 0759  GLUCAP 94 114* 117* 119* 130*    Recent Results (from the past 240 hour(s))  Blood culture (routine x 2)     Status: None (Preliminary result)   Collection Time: 11/28/15  9:25 PM  Result Value Ref Range Status   Specimen Description LEFT ANTECUBITAL  Final   Special Requests BOTTLES DRAWN AEROBIC ONLY 6CC ONLY  Final   Culture NO GROWTH 2 DAYS  Final   Report Status PENDING  Incomplete  Blood culture  (routine x 2)     Status: None (Preliminary result)   Collection Time: 11/28/15  9:25 PM  Result Value Ref Range Status   Specimen Description LEFT ANTECUBITAL  Final   Special Requests BOTTLES DRAWN AEROBIC ONLY 6CC ONLY  Final   Culture NO GROWTH 2 DAYS  Final   Report Status PENDING  Incomplete  MRSA PCR Screening     Status: None   Collection Time: 11/29/15 12:46 AM  Result Value Ref Range Status   MRSA by PCR NEGATIVE NEGATIVE Final    Comment:        The GeneXpert MRSA Assay (FDA approved for NASAL specimens only), is one component of a comprehensive MRSA colonization surveillance program. It is not intended to diagnose MRSA infection nor to guide or monitor treatment for MRSA infections.   Culture, respiratory (NON-Expectorated)     Status: None   Collection Time: 11/29/15  3:18 PM  Result Value Ref Range Status   Specimen Description TRACHEAL ASPIRATE  Final   Special Requests NONE  Final   Gram Stain   Final    NO WBC SEEN NO SQUAMOUS EPITHELIAL CELLS SEEN NO ORGANISMS SEEN Performed at [facility]    Culture   Final    NORMAL OROPHARYNGEAL FLORA Performed at [facility]    Report Status 12/01/2015 FINAL  Final     Studies: Dg Chest Port 1 View  12/01/2015  CLINICAL DATA:  82 year old male currently ventilated EXAM: PORTABLE CHEST 1 VIEW COMPARISON:  Prior chest x-ray 11/30/2015 FINDINGS: The endotracheal tube is 6.2 cm above the carina. A nasogastric tube is present, the tip is not imaged but lies below the diaphragm likely within the stomach. Stable cardiac and mediastinal contours. Atherosclerotic calcifications again noted in the transverse aorta. Improved aeration with decreased bibasilar opacities likely reflecting decreasing atelectasis. There are persistent bilateral moderate layering pleural effusions. No pulmonary edema. No pneumothorax. No acute osseous abnormality. IMPRESSION: 1. Improved aeration in both lung bases likely secondary to  decreasing atelectasis. 2. Persistent bilateral moderate layering pleural effusions. 3. Stable and satisfactory support apparatus. Electronically Signed   By: Malachy Moan M.D.   On: 12/01/2015 07:47    Scheduled Meds: . albuterol  2.5 mg Nebulization Q6H  . antiseptic oral rinse  7 mL Mouth Rinse QID  . aspirin EC  81 mg Oral Daily  . chlorhexidine gluconate  15 mL Mouth Rinse BID  . enoxaparin (LOVENOX) injection  60 mg Subcutaneous Q24H  . famotidine  20 mg Per Tube Daily  . feeding supplement (PRO-STAT SUGAR FREE 64)  30 mL Oral TID  . feeding supplement (VITAL HIGH PROTEIN)  1,000 mL Per Tube Q24H  . furosemide  10 mg Intravenous Q12H  . insulin aspart  0-15 Units Subcutaneous 6 times per day  . insulin glargine  7 Units Subcutaneous BID  .  methylPREDNISolone (SOLU-MEDROL) injection  125 mg Intravenous Once  . methylPREDNISolone (SOLU-MEDROL) injection  40 mg Intravenous Q6H  . piperacillin-tazobactam (ZOSYN)  IV  3.375 g Intravenous Q8H  . sodium chloride  3 mL Intravenous Q12H  . vancomycin  750 mg Intravenous Q12H   Continuous Infusions: . dextrose 5 % and 0.9 % NaCl with KCl 20 mEq/L 70 mL/hr at 12/02/15 0610  . propofol (DIPRIVAN) infusion 40 mcg/kg/min (12/02/15 0709)    Principal Problem:   Acute respiratory failure with hypercapnia (HCC) Active Problems:   Obesity   Chronic pain syndrome   Chronic combined systolic and diastolic congestive heart failure (HCC)   Chronic atrial fibrillation (HCC)   COPD mixed type (HCC)   Bilateral pneumonia   Sepsis (HCC)   Hypothermia   Hyperglycemia, drug-induced   Morbid obesity (HCC)   Hypokalemia    Critical care time spent: 35 minutes. Greater than 50% of this time was spent in direct contact with the patient coordinating care.    Chaya JanHERNANDEZ ACOSTA,ESTELA  [facility] Pager [phone]  If 7PM-7AM, please contact night-coverage at [email or web address], password [email or web address] 12/02/2015, 9:52 AM  LOS: 4 days

## 2015-12-02 NOTE — Progress Notes (Signed)
**Note De-Identified  Obfuscation** Patient returned to full support on ventilator due to increased WOB, HR sustaining 158, RR 60s. RRT to continue to monitor.

## 2015-12-02 NOTE — Progress Notes (Addendum)
22cc of IV Versed wasted in the sink. 2 RN's verified waste. E.Spangler RN   Witnessed Alisia FerrariErica Spangler, RN waste 22cc of Versed in sink. Schonewitz, Candelaria StagersLeigh Anne

## 2015-12-02 NOTE — Progress Notes (Signed)
Subjective: He failed weaning yesterday. His heart rate went up and it's not totally clear if this was because he was simply not ready or if he became anxious/agitated. He looks very comfortable now on the ventilator and sedated.  Objective: Vital signs in last 24 hours: Temp:  [97.1 F (36.2 C)-99.7 F (37.6 C)] 97.4 F (36.3 C) (01/11 0807) Pulse Rate:  [64-121] 73 (01/11 0700) Resp:  [6-26] 12 (01/11 0700) BP: (97-151)/(54-109) 113/55 mmHg (01/11 0700) SpO2:  [87 %-97 %] 96 % (01/11 0809) FiO2 (%):  [40 %] 40 % (01/11 0809) Weight:  [114.3 kg (251 lb 15.8 oz)] 114.3 kg (251 lb 15.8 oz) (01/11 0500) Weight change: 0.8 kg (1 lb 12.2 oz) Last BM Date: 12/02/15  Intake/Output from previous day: 01/10 0701 - 01/11 0700 In: 3192.8 [I.V.:2339.9; NG/GT:252.8; IV Piggyback:600] Out: 650 [Urine:650]  PHYSICAL EXAM General appearance: Intubated sedated on mechanical ventilation, morbidly obese Resp: rhonchi bilaterally Cardio: irregularly irregular rhythm GI: soft, non-tender; bowel sounds normal; no masses,  no organomegaly Extremities: extremities normal, atraumatic, no cyanosis or edema  Lab Results:  Results for orders placed or performed during the hospital encounter of 11/28/15 (from the past 48 hour(s))  Glucose, capillary     Status: Abnormal   Collection Time: 11/30/15 10:57 AM  Result Value Ref Range   Glucose-Capillary 160 (H) 65 - 99 mg/dL  Glucose, capillary     Status: Abnormal   Collection Time: 11/30/15  4:09 PM  Result Value Ref Range   Glucose-Capillary 136 (H) 65 - 99 mg/dL  Glucose, capillary     Status: Abnormal   Collection Time: 11/30/15  7:10 PM  Result Value Ref Range   Glucose-Capillary 116 (H) 65 - 99 mg/dL  BMET today at 2000     Status: Abnormal   Collection Time: 11/30/15  7:45 PM  Result Value Ref Range   Sodium 144 135 - 145 mmol/L   Potassium 3.8 3.5 - 5.1 mmol/L    Comment: DELTA CHECK NOTED   Chloride 106 101 - 111 mmol/L   CO2 29 22 - 32  mmol/L   Glucose, Bld 131 (H) 65 - 99 mg/dL   BUN 24 (H) 6 - 20 mg/dL   Creatinine, Ser 0.92 0.61 - 1.24 mg/dL   Calcium 9.4 8.9 - 10.3 mg/dL   GFR calc non Af Amer >60 >60 mL/min   GFR calc Af Amer >60 >60 mL/min    Comment: (NOTE) The eGFR has been calculated using the CKD EPI equation. This calculation has not been validated in all clinical situations. eGFR's persistently <60 mL/min signify possible Chronic Kidney Disease.    Anion gap 9 5 - 15  Glucose, capillary     Status: Abnormal   Collection Time: 12/01/15 12:15 AM  Result Value Ref Range   Glucose-Capillary 112 (H) 65 - 99 mg/dL  Blood gas, arterial     Status: Abnormal   Collection Time: 12/01/15  4:16 AM  Result Value Ref Range   FIO2 0.40    Delivery systems VENTILATOR    Mode PRESSURE REGULATED VOLUME CONTROL    VT 600 mL   LHR 12 resp/min   Peep/cpap 5.0 cm H20   pH, Arterial 7.464 (H) 7.350 - 7.450   pCO2 arterial 41.7 35.0 - 45.0 mmHg   pO2, Arterial 64.1 (L) 80.0 - 100.0 mmHg   Bicarbonate 29.3 (H) 20.0 - 24.0 mEq/L   TCO2 15.6 0 - 100 mmol/L   Acid-Base Excess 5.7 (H) 0.0 -  2.0 mmol/L   O2 Saturation 92.6 %   Collection site RIGHT RADIAL    Drawn by [phone]    Sample type ARTERIAL DRAW    Allens test (pass/fail) PASS PASS  CBC     Status: Abnormal   Collection Time: 12/01/15  5:12 AM  Result Value Ref Range   WBC 8.4 4.0 - 10.5 K/uL   RBC 3.96 (L) 4.22 - 5.81 MIL/uL   Hemoglobin 12.0 (L) 13.0 - 17.0 g/dL   HCT 37.6 (L) 39.0 - 52.0 %   MCV 94.9 78.0 - 100.0 fL   MCH 30.3 26.0 - 34.0 pg   MCHC 31.9 30.0 - 36.0 g/dL   RDW 14.0 11.5 - 15.5 %   Platelets 185 150 - 400 K/uL  Basic metabolic panel     Status: Abnormal   Collection Time: 12/01/15  5:12 AM  Result Value Ref Range   Sodium 144 135 - 145 mmol/L   Potassium 4.4 3.5 - 5.1 mmol/L   Chloride 108 101 - 111 mmol/L   CO2 29 22 - 32 mmol/L   Glucose, Bld 113 (H) 65 - 99 mg/dL   BUN 25 (H) 6 - 20 mg/dL   Creatinine, Ser 0.85 0.61 - 1.24 mg/dL    Calcium 9.0 8.9 - 10.3 mg/dL   GFR calc non Af Amer >60 >60 mL/min   GFR calc Af Amer >60 >60 mL/min    Comment: (NOTE) The eGFR has been calculated using the CKD EPI equation. This calculation has not been validated in all clinical situations. eGFR's persistently <60 mL/min signify possible Chronic Kidney Disease.    Anion gap 7 5 - 15  Glucose, capillary     Status: None   Collection Time: 12/01/15  5:42 AM  Result Value Ref Range   Glucose-Capillary 92 65 - 99 mg/dL  Glucose, capillary     Status: None   Collection Time: 12/01/15  7:51 AM  Result Value Ref Range   Glucose-Capillary 98 65 - 99 mg/dL   Comment 1 Notify RN    Comment 2 Document in Chart   Glucose, capillary     Status: None   Collection Time: 12/01/15 11:43 AM  Result Value Ref Range   Glucose-Capillary 89 65 - 99 mg/dL   Comment 1 Notify RN    Comment 2 Document in Chart   Glucose, capillary     Status: None   Collection Time: 12/01/15  4:32 PM  Result Value Ref Range   Glucose-Capillary 94 65 - 99 mg/dL   Comment 1 Notify RN    Comment 2 Document in Chart   Glucose, capillary     Status: Abnormal   Collection Time: 12/01/15  7:43 PM  Result Value Ref Range   Glucose-Capillary 114 (H) 65 - 99 mg/dL  Glucose, capillary     Status: Abnormal   Collection Time: 12/02/15  1:05 AM  Result Value Ref Range   Glucose-Capillary 117 (H) 65 - 99 mg/dL  Blood gas, arterial     Status: Abnormal   Collection Time: 12/02/15  4:34 AM  Result Value Ref Range   FIO2 0.40    Delivery systems VENTILATOR    Mode PRESSURE REGULATED VOLUME CONTROL    VT 600 mL   LHR 12.0 resp/min   Peep/cpap 5.0 cm H20   pH, Arterial 7.412 7.350 - 7.450   pCO2 arterial 45.7 (H) 35.0 - 45.0 mmHg   pO2, Arterial 76.5 (L) 80.0 - 100.0 mmHg   Bicarbonate  27.7 (H) 20.0 - 24.0 mEq/L   TCO2 16.5 0 - 100 mmol/L   Acid-Base Excess 4.2 (H) 0.0 - 2.0 mmol/L   O2 Saturation 94.6 %   Collection site LEFT RADIAL    Drawn by [phone]    Sample  type ARTERIAL DRAW    Allens test (pass/fail) PASS PASS  CBC     Status: Abnormal   Collection Time: 12/02/15  4:38 AM  Result Value Ref Range   WBC 5.4 4.0 - 10.5 K/uL   RBC 4.24 4.22 - 5.81 MIL/uL   Hemoglobin 12.9 (L) 13.0 - 17.0 g/dL   HCT 40.9 39.0 - 52.0 %   MCV 96.5 78.0 - 100.0 fL   MCH 30.4 26.0 - 34.0 pg   MCHC 31.5 30.0 - 36.0 g/dL   RDW 14.0 11.5 - 15.5 %   Platelets 151 150 - 400 K/uL  Basic metabolic panel     Status: Abnormal   Collection Time: 12/02/15  4:38 AM  Result Value Ref Range   Sodium 144 135 - 145 mmol/L   Potassium 4.5 3.5 - 5.1 mmol/L   Chloride 109 101 - 111 mmol/L   CO2 28 22 - 32 mmol/L   Glucose, Bld 138 (H) 65 - 99 mg/dL   BUN 35 (H) 6 - 20 mg/dL   Creatinine, Ser 1.01 0.61 - 1.24 mg/dL   Calcium 8.9 8.9 - 10.3 mg/dL   GFR calc non Af Amer >60 >60 mL/min   GFR calc Af Amer >60 >60 mL/min    Comment: (NOTE) The eGFR has been calculated using the CKD EPI equation. This calculation has not been validated in all clinical situations. eGFR's persistently <60 mL/min signify possible Chronic Kidney Disease.    Anion gap 7 5 - 15  Lactic acid, plasma     Status: None   Collection Time: 12/02/15  4:38 AM  Result Value Ref Range   Lactic Acid, Venous 1.3 0.5 - 2.0 mmol/L  Glucose, capillary     Status: Abnormal   Collection Time: 12/02/15  5:08 AM  Result Value Ref Range   Glucose-Capillary 119 (H) 65 - 99 mg/dL  Glucose, capillary     Status: Abnormal   Collection Time: 12/02/15  7:59 AM  Result Value Ref Range   Glucose-Capillary 130 (H) 65 - 99 mg/dL   Comment 1 Notify RN    Comment 2 Document in Chart     ABGS  Recent Labs  12/02/15 0434  PHART 7.412  PO2ART 76.5*  TCO2 16.5  HCO3 27.7*   CULTURES Recent Results (from the past 240 hour(s))  Blood culture (routine x 2)     Status: None (Preliminary result)   Collection Time: 11/28/15  9:25 PM  Result Value Ref Range Status   Specimen Description LEFT ANTECUBITAL  Final    Special Requests BOTTLES DRAWN AEROBIC ONLY 6CC ONLY  Final   Culture NO GROWTH 2 DAYS  Final   Report Status PENDING  Incomplete  Blood culture (routine x 2)     Status: None (Preliminary result)   Collection Time: 11/28/15  9:25 PM  Result Value Ref Range Status   Specimen Description LEFT ANTECUBITAL  Final   Special Requests BOTTLES DRAWN AEROBIC ONLY 6CC ONLY  Final   Culture NO GROWTH 2 DAYS  Final   Report Status PENDING  Incomplete  MRSA PCR Screening     Status: None   Collection Time: 11/29/15 12:46 AM  Result Value Ref Range Status  MRSA by PCR NEGATIVE NEGATIVE Final    Comment:        The GeneXpert MRSA Assay (FDA approved for NASAL specimens only), is one component of a comprehensive MRSA colonization surveillance program. It is not intended to diagnose MRSA infection nor to guide or monitor treatment for MRSA infections.   Culture, respiratory (NON-Expectorated)     Status: None   Collection Time: 11/29/15  3:18 PM  Result Value Ref Range Status   Specimen Description TRACHEAL ASPIRATE  Final   Special Requests NONE  Final   Gram Stain   Final    NO WBC SEEN NO SQUAMOUS EPITHELIAL CELLS SEEN NO ORGANISMS SEEN Performed at [facility]    Culture   Final    NORMAL OROPHARYNGEAL FLORA Performed at [facility]    Report Status 12/01/2015 FINAL  Final   Studies/Results: Dg Chest Port 1 View  12/01/2015  CLINICAL DATA:  82 year old male currently ventilated EXAM: PORTABLE CHEST 1 VIEW COMPARISON:  Prior chest x-ray 11/30/2015 FINDINGS: The endotracheal tube is 6.2 cm above the carina. A nasogastric tube is present, the tip is not imaged but lies below the diaphragm likely within the stomach. Stable cardiac and mediastinal contours. Atherosclerotic calcifications again noted in the transverse aorta. Improved aeration with decreased bibasilar opacities likely reflecting decreasing atelectasis. There are persistent bilateral moderate layering  pleural effusions. No pulmonary edema. No pneumothorax. No acute osseous abnormality. IMPRESSION: 1. Improved aeration in both lung bases likely secondary to decreasing atelectasis. 2. Persistent bilateral moderate layering pleural effusions. 3. Stable and satisfactory support apparatus. Electronically Signed   By: Jacqulynn Cadet M.D.   On: 12/01/2015 07:47   Dg Chest Port 1 View  11/30/2015  CLINICAL DATA:  Respiratory failure EXAM: PORTABLE CHEST 1 VIEW COMPARISON:  Yesterday FINDINGS: Endotracheal tube tip is between the clavicular heads and carina. Gastric suction tube is not visible below the thoracic inlet due to underpenetration. Increasing hazy basilar opacities, likely atelectasis and pleural fluid. Apparent increase could be from dependent flow. There could be underlying airspace opacity. No pulmonary edema seen in the apical lungs. Normal heart size and stable mediastinal contours. IMPRESSION: 1. Hazy basilar opacities favoring atelectasis and layering pleural fluid. 2. Endotracheal tube remains in good position. Most of the orogastric tube is not visible due to technical factors. Electronically Signed   By: Monte Fantasia M.D.   On: 11/30/2015 09:08    Medications:  Prior to Admission:  Prescriptions prior to admission  Medication Sig Dispense Refill Last Dose  . amLODipine (NORVASC) 10 MG tablet Take 10 mg by mouth daily.     Marland Kitchen diltiazem (CARDIZEM CD) 360 MG 24 hr capsule Take 1 capsule (360 mg total) by mouth daily. 30 capsule 6   . furosemide (LASIX) 20 MG tablet TAKE 1 TABLET (20 MG TOTAL) BY MOUTH DAILY. 30 tablet 11   . LORazepam (ATIVAN) 1 MG tablet Take 1 tablet (1 mg total) by mouth every 8 (eight) hours as needed for anxiety. 60 tablet 5   . metoprolol (LOPRESSOR) 50 MG tablet TAKE 1 TABLET (50 MG TOTAL) BY MOUTH DAILY. 30 tablet 5   . pravastatin (PRAVACHOL) 40 MG tablet TAKE 1 TABLET (40 MG TOTAL) BY MOUTH DAILY. 30 tablet 3   . tamsulosin (FLOMAX) 0.4 MG CAPS capsule Take  1 capsule by mouth daily.     . traMADol (ULTRAM) 50 MG tablet TAKE 1 TABLET BY MOUTH EVERY eight HOURS AS NEEDED FOR MODERATE PAIN 90 tablet  5   . albuterol (PROVENTIL HFA;VENTOLIN HFA) 108 (90 BASE) MCG/ACT inhaler Inhale 2 puffs into the lungs every 6 (six) hours as needed for wheezing or shortness of breath.   Unknown at Unknown time  . albuterol (PROVENTIL) (2.5 MG/3ML) 0.083% nebulizer solution Take 3 mLs (2.5 mg total) by nebulization every 6 (six) hours as needed for wheezing or shortness of breath. 75 mL 12 Unknown at Unknown time  . aspirin 81 MG EC tablet Take 81 mg by mouth daily.  0 Unknown at Unknown time  . budesonide-formoterol (SYMBICORT) 160-4.5 MCG/ACT inhaler Inhale 2 puffs into the lungs 2 (two) times daily. 1 Inhaler 5 Unknown at Unknown time  . guaiFENesin (MUCINEX) 600 MG 12 hr tablet Take 2 tablets (1,200 mg total) by mouth 2 (two) times daily. 20 tablet 1 Unknown at Unknown time   Scheduled: . albuterol  2.5 mg Nebulization Q6H  . antiseptic oral rinse  7 mL Mouth Rinse QID  . aspirin EC  81 mg Oral Daily  . chlorhexidine gluconate  15 mL Mouth Rinse BID  . enoxaparin (LOVENOX) injection  60 mg Subcutaneous Q24H  . famotidine  20 mg Per Tube Daily  . feeding supplement (PRO-STAT SUGAR FREE 64)  30 mL Oral TID  . feeding supplement (VITAL HIGH PROTEIN)  1,000 mL Per Tube Q24H  . furosemide  10 mg Intravenous Q12H  . insulin aspart  0-15 Units Subcutaneous 6 times per day  . insulin glargine  7 Units Subcutaneous BID  . methylPREDNISolone (SOLU-MEDROL) injection  125 mg Intravenous Once  . methylPREDNISolone (SOLU-MEDROL) injection  40 mg Intravenous Q6H  . piperacillin-tazobactam (ZOSYN)  IV  3.375 g Intravenous Q8H  . sodium chloride  3 mL Intravenous Q12H  . vancomycin  750 mg Intravenous Q12H   Continuous: . dextrose 5 % and 0.9 % NaCl with KCl 20 mEq/L 70 mL/hr at 12/02/15 0610  . midazolam (VERSED) infusion Stopped (11/30/15 0300)  . propofol (DIPRIVAN)  infusion 40 mcg/kg/min (12/02/15 0709)   QDU:[id number], fentaNYL (SUBLIMAZE) injection, metoprolol  Assesment: He has acute hypercapnic/hypoxic respiratory failure requiring ventilator support. He has bilateral pneumonia and COPD. He was septic on admission which seems better. He has morbid obesity and probably some element of obesity hypoventilation. He is improving. He failed weaning yesterday which may have been a combination of simply not being ready but also some possibility of some anxiety/agitation.  He has a history of combined systolic and diastolic heart failure and that is pretty stable at this time.  He has chronic atrial fibrillation and has refused anticoagulation in the past Principal Problem:   Acute respiratory failure with hypercapnia (HCC) Active Problems:   Obesity   Chronic pain syndrome   Chronic combined systolic and diastolic congestive heart failure (HCC)   Chronic atrial fibrillation (HCC)   COPD mixed type (HCC)   Bilateral pneumonia   Sepsis ([place])   Hypothermia   Hyperglycemia, drug-induced   Morbid obesity ([place])   Hypokalemia    Plan: Attempt weaning again. He will have Ativan available if needed    LOS: 4 days   Aryanah Enslow L 12/02/2015, 8:33 AM

## 2015-12-03 ENCOUNTER — Inpatient Hospital Stay (HOSPITAL_COMMUNITY): Payer: Medicare Other

## 2015-12-03 LAB — CBC
HCT: 43.7 % (ref 39.0–52.0)
HEMOGLOBIN: 13.4 g/dL (ref 13.0–17.0)
MCH: 29.5 pg (ref 26.0–34.0)
MCHC: 30.7 g/dL (ref 30.0–36.0)
MCV: 96 fL (ref 78.0–100.0)
PLATELETS: 163 10*3/uL (ref 150–400)
RBC: 4.55 MIL/uL (ref 4.22–5.81)
RDW: 13.8 % (ref 11.5–15.5)
WBC: 5.2 10*3/uL (ref 4.0–10.5)

## 2015-12-03 LAB — GLUCOSE, CAPILLARY
GLUCOSE-CAPILLARY: 108 mg/dL — AB (ref 65–99)
GLUCOSE-CAPILLARY: 116 mg/dL — AB (ref 65–99)
GLUCOSE-CAPILLARY: 139 mg/dL — AB (ref 65–99)
Glucose-Capillary: 107 mg/dL — ABNORMAL HIGH (ref 65–99)
Glucose-Capillary: 120 mg/dL — ABNORMAL HIGH (ref 65–99)
Glucose-Capillary: 93 mg/dL (ref 65–99)

## 2015-12-03 LAB — BLOOD GAS, ARTERIAL
Acid-Base Excess: 6.1 mmol/L — ABNORMAL HIGH (ref 0.0–2.0)
Bicarbonate: 29.4 mEq/L — ABNORMAL HIGH (ref 20.0–24.0)
DRAWN BY: 382351
FIO2: 0.4
MECHVT: 600 mL
O2 SAT: 95.9 %
PATIENT TEMPERATURE: 37
PCO2 ART: 48.7 mmHg — AB (ref 35.0–45.0)
PEEP: 5 cmH2O
PH ART: 7.415 (ref 7.350–7.450)
RATE: 12 resp/min
pO2, Arterial: 82.9 mmHg (ref 80.0–100.0)

## 2015-12-03 LAB — BASIC METABOLIC PANEL
ANION GAP: 5 (ref 5–15)
BUN: 39 mg/dL — ABNORMAL HIGH (ref 6–20)
CALCIUM: 8.7 mg/dL — AB (ref 8.9–10.3)
CO2: 30 mmol/L (ref 22–32)
Chloride: 109 mmol/L (ref 101–111)
Creatinine, Ser: 0.92 mg/dL (ref 0.61–1.24)
GLUCOSE: 122 mg/dL — AB (ref 65–99)
Potassium: 4.2 mmol/L (ref 3.5–5.1)
Sodium: 144 mmol/L (ref 135–145)

## 2015-12-03 LAB — TRIGLYCERIDES: Triglycerides: 83 mg/dL (ref <150)

## 2015-12-03 MED ORDER — METHYLPREDNISOLONE SODIUM SUCC 40 MG IJ SOLR
40.0000 mg | INTRAMUSCULAR | Status: DC
  Administered 2015-12-04: 40 mg via INTRAVENOUS
  Filled 2015-12-03: qty 1

## 2015-12-03 MED ORDER — HALOPERIDOL LACTATE 5 MG/ML IJ SOLN
5.0000 mg | Freq: Once | INTRAMUSCULAR | Status: AC
  Administered 2015-12-03: 5 mg via INTRAVENOUS
  Filled 2015-12-03: qty 1

## 2015-12-03 NOTE — Progress Notes (Addendum)
Subjective: He failed weaning again yesterday. He again developed increased heart rate increased work of breathing and increased respiratory rate. Things have been going well when this occurred. He had Ativan prior to the weaning attempt.  Objective: Vital signs in last 24 hours: Temp:  [97 F (36.1 C)-97.8 F (36.6 C)] 97.8 F (36.6 C) (01/12 0400) Pulse Rate:  [61-113] 69 (01/12 0630) Resp:  [11-25] 14 (01/12 0630) BP: (103-150)/(53-83) 108/57 mmHg (01/12 0630) SpO2:  [82 %-98 %] 93 % (01/12 0630) FiO2 (%):  [40 %] 40 % (01/12 0510) Weight:  [112.6 kg (248 lb 3.8 oz)] 112.6 kg (248 lb 3.8 oz) (01/12 0500) Weight change: -1.7 kg (-3 lb 12 oz) Last BM Date: 12/02/15  Intake/Output from previous day: 01/11 0701 - 01/12 0700 In: 2454.7 [I.V.:2304.7; IV Piggyback:150] Out: 4075 [Urine:4075]  PHYSICAL EXAM General appearance: Intubated sedated on mechanical ventilation obese Resp: clear to auscultation bilaterally Cardio: irregularly irregular rhythm GI: soft, non-tender; bowel sounds normal; no masses,  no organomegaly Extremities: extremities normal, atraumatic, no cyanosis or edema  Lab Results:  Results for orders placed or performed during the hospital encounter of 11/28/15 (from the past 48 hour(s))  Glucose, capillary     Status: None   Collection Time: 12/01/15  7:51 AM  Result Value Ref Range   Glucose-Capillary 98 65 - 99 mg/dL   Comment 1 Notify RN    Comment 2 Document in Chart   Glucose, capillary     Status: None   Collection Time: 12/01/15 11:43 AM  Result Value Ref Range   Glucose-Capillary 89 65 - 99 mg/dL   Comment 1 Notify RN    Comment 2 Document in Chart   Glucose, capillary     Status: None   Collection Time: 12/01/15  4:32 PM  Result Value Ref Range   Glucose-Capillary 94 65 - 99 mg/dL   Comment 1 Notify RN    Comment 2 Document in Chart   Glucose, capillary     Status: Abnormal   Collection Time: 12/01/15  7:43 PM  Result Value Ref Range    Glucose-Capillary 114 (H) 65 - 99 mg/dL  Glucose, capillary     Status: Abnormal   Collection Time: 12/02/15  1:05 AM  Result Value Ref Range   Glucose-Capillary 117 (H) 65 - 99 mg/dL  Blood gas, arterial     Status: Abnormal   Collection Time: 12/02/15  4:34 AM  Result Value Ref Range   FIO2 0.40    Delivery systems VENTILATOR    Mode PRESSURE REGULATED VOLUME CONTROL    VT 600 mL   LHR 12.0 resp/min   Peep/cpap 5.0 cm H20   pH, Arterial 7.412 7.350 - 7.450   pCO2 arterial 45.7 (H) 35.0 - 45.0 mmHg   pO2, Arterial 76.5 (L) 80.0 - 100.0 mmHg   Bicarbonate 27.7 (H) 20.0 - 24.0 mEq/L   TCO2 16.5 0 - 100 mmol/L   Acid-Base Excess 4.2 (H) 0.0 - 2.0 mmol/L   O2 Saturation 94.6 %   Collection site LEFT RADIAL    Drawn by [id number]    Sample type ARTERIAL DRAW    Allens test (pass/fail) PASS PASS  CBC     Status: Abnormal   Collection Time: 12/02/15  4:38 AM  Result Value Ref Range   WBC 5.4 4.0 - 10.5 K/uL   RBC 4.24 4.22 - 5.81 MIL/uL   Hemoglobin 12.9 (L) 13.0 - 17.0 g/dL   HCT 40.9 39.0 - 52.0 %  MCV 96.5 78.0 - 100.0 fL   MCH 30.4 26.0 - 34.0 pg   MCHC 31.5 30.0 - 36.0 g/dL   RDW 14.0 11.5 - 15.5 %   Platelets 151 150 - 400 K/uL  Basic metabolic panel     Status: Abnormal   Collection Time: 12/02/15  4:38 AM  Result Value Ref Range   Sodium 144 135 - 145 mmol/L   Potassium 4.5 3.5 - 5.1 mmol/L   Chloride 109 101 - 111 mmol/L   CO2 28 22 - 32 mmol/L   Glucose, Bld 138 (H) 65 - 99 mg/dL   BUN 35 (H) 6 - 20 mg/dL   Creatinine, Ser 1.01 0.61 - 1.24 mg/dL   Calcium 8.9 8.9 - 10.3 mg/dL   GFR calc non Af Amer >60 >60 mL/min   GFR calc Af Amer >60 >60 mL/min    Comment: (NOTE) The eGFR has been calculated using the CKD EPI equation. This calculation has not been validated in all clinical situations. eGFR's persistently <60 mL/min signify possible Chronic Kidney Disease.    Anion gap 7 5 - 15  Lactic acid, plasma     Status: None   Collection Time: 12/02/15  4:38 AM   Result Value Ref Range   Lactic Acid, Venous 1.3 0.5 - 2.0 mmol/L  Glucose, capillary     Status: Abnormal   Collection Time: 12/02/15  5:08 AM  Result Value Ref Range   Glucose-Capillary 119 (H) 65 - 99 mg/dL  Glucose, capillary     Status: Abnormal   Collection Time: 12/02/15  7:59 AM  Result Value Ref Range   Glucose-Capillary 130 (H) 65 - 99 mg/dL   Comment 1 Notify RN    Comment 2 Document in Chart   Glucose, capillary     Status: Abnormal   Collection Time: 12/02/15 11:30 AM  Result Value Ref Range   Glucose-Capillary 122 (H) 65 - 99 mg/dL   Comment 1 Notify RN    Comment 2 Document in Chart   Glucose, capillary     Status: Abnormal   Collection Time: 12/02/15  5:02 PM  Result Value Ref Range   Glucose-Capillary 112 (H) 65 - 99 mg/dL   Comment 1 Notify RN    Comment 2 Document in Chart   Glucose, capillary     Status: Abnormal   Collection Time: 12/02/15  8:54 PM  Result Value Ref Range   Glucose-Capillary 123 (H) 65 - 99 mg/dL   Comment 1 Document in Chart    Comment 2 Repeat Test   Glucose, capillary     Status: Abnormal   Collection Time: 12/03/15 12:14 AM  Result Value Ref Range   Glucose-Capillary 139 (H) 65 - 99 mg/dL   Comment 1 Notify RN   Basic metabolic panel     Status: Abnormal   Collection Time: 12/03/15  4:37 AM  Result Value Ref Range   Sodium 144 135 - 145 mmol/L   Potassium 4.2 3.5 - 5.1 mmol/L   Chloride 109 101 - 111 mmol/L   CO2 30 22 - 32 mmol/L   Glucose, Bld 122 (H) 65 - 99 mg/dL   BUN 39 (H) 6 - 20 mg/dL   Creatinine, Ser 0.92 0.61 - 1.24 mg/dL   Calcium 8.7 (L) 8.9 - 10.3 mg/dL   GFR calc non Af Amer >60 >60 mL/min   GFR calc Af Amer >60 >60 mL/min    Comment: (NOTE) The eGFR has been calculated using the CKD  EPI equation. This calculation has not been validated in all clinical situations. eGFR's persistently <60 mL/min signify possible Chronic Kidney Disease.    Anion gap 5 5 - 15  CBC     Status: None   Collection Time:  12/03/15  4:37 AM  Result Value Ref Range   WBC 5.2 4.0 - 10.5 K/uL   RBC 4.55 4.22 - 5.81 MIL/uL   Hemoglobin 13.4 13.0 - 17.0 g/dL   HCT 43.7 39.0 - 52.0 %   MCV 96.0 78.0 - 100.0 fL   MCH 29.5 26.0 - 34.0 pg   MCHC 30.7 30.0 - 36.0 g/dL   RDW 13.8 11.5 - 15.5 %   Platelets 163 150 - 400 K/uL  Triglycerides     Status: None   Collection Time: 12/03/15  4:37 AM  Result Value Ref Range   Triglycerides 83 <150 mg/dL  Glucose, capillary     Status: Abnormal   Collection Time: 12/03/15  4:50 AM  Result Value Ref Range   Glucose-Capillary 107 (H) 65 - 99 mg/dL   Comment 1 Notify RN   Blood gas, arterial     Status: Abnormal   Collection Time: 12/03/15  5:16 AM  Result Value Ref Range   FIO2 0.40    Delivery systems VENTILATOR    Mode PRESSURE REGULATED VOLUME CONTROL    VT 600 mL   LHR 12 resp/min   Peep/cpap 5.0 cm H20   pH, Arterial 7.415 7.350 - 7.450   pCO2 arterial 48.7 (H) 35.0 - 45.0 mmHg   pO2, Arterial 82.9 80.0 - 100.0 mmHg   Bicarbonate 29.4 (H) 20.0 - 24.0 mEq/L   Acid-Base Excess 6.1 (H) 0.0 - 2.0 mmol/L   O2 Saturation 95.9 %   Patient temperature 37.0    Collection site RIGHT RADIAL    Drawn by [id number]    Sample type ARTERIAL DRAW    Allens test (pass/fail) PASS PASS    ABGS  Recent Labs  12/02/15 0434 12/03/15 0516  PHART 7.412 7.415  PO2ART 76.5* 82.9  TCO2 16.5  --   HCO3 27.7* 29.4*   CULTURES Recent Results (from the past 240 hour(s))  Blood culture (routine x 2)     Status: None (Preliminary result)   Collection Time: 11/28/15  9:25 PM  Result Value Ref Range Status   Specimen Description LEFT ANTECUBITAL  Final   Special Requests BOTTLES DRAWN AEROBIC ONLY 6CC ONLY  Final   Culture NO GROWTH 2 DAYS  Final   Report Status PENDING  Incomplete  Blood culture (routine x 2)     Status: None (Preliminary result)   Collection Time: 11/28/15  9:25 PM  Result Value Ref Range Status   Specimen Description LEFT ANTECUBITAL  Final   Special  Requests BOTTLES DRAWN AEROBIC ONLY 6CC ONLY  Final   Culture NO GROWTH 2 DAYS  Final   Report Status PENDING  Incomplete  MRSA PCR Screening     Status: None   Collection Time: 11/29/15 12:46 AM  Result Value Ref Range Status   MRSA by PCR NEGATIVE NEGATIVE Final    Comment:        The GeneXpert MRSA Assay (FDA approved for NASAL specimens only), is one component of a comprehensive MRSA colonization surveillance program. It is not intended to diagnose MRSA infection nor to guide or monitor treatment for MRSA infections.   Culture, respiratory (NON-Expectorated)     Status: None   Collection Time: 11/29/15  3:18 PM  Result Value Ref Range Status   Specimen Description TRACHEAL ASPIRATE  Final   Special Requests NONE  Final   Gram Stain   Final    NO WBC SEEN NO SQUAMOUS EPITHELIAL CELLS SEEN NO ORGANISMS SEEN Performed at [facility]    Culture   Final    NORMAL OROPHARYNGEAL FLORA Performed at [facility]    Report Status 12/01/2015 FINAL  Final   Studies/Results: Dg Chest Port 1 View  12/02/2015  CLINICAL DATA:  Orogastric tube placement EXAM: PORTABLE CHEST 1 VIEW COMPARISON:  Portable exam 1443 hours compared to 12/01/2015 FINDINGS: Exam centered low to include upper abdomen for OG tube placement. Tip of orogastric tube is difficult to visualize but appears project over the mid to distal stomach. If stable heart size and mediastinal contours. Tip of endotracheal tube not visualized by this exam. Bibasilar effusions and atelectasis. Atherosclerotic calcification aorta. Visualized bowel gas pattern unremarkable. IMPRESSION: Tip of orogastric tube appears to project over the mid to distal stomach. Electronically Signed   By: Lavonia Dana M.D.   On: 12/02/2015 15:13    Medications:  Prior to Admission:  Prescriptions prior to admission  Medication Sig Dispense Refill Last Dose  . amLODipine (NORVASC) 10 MG tablet Take 10 mg by mouth daily.     Marland Kitchen diltiazem  (CARDIZEM CD) 360 MG 24 hr capsule Take 1 capsule (360 mg total) by mouth daily. 30 capsule 6   . furosemide (LASIX) 20 MG tablet TAKE 1 TABLET (20 MG TOTAL) BY MOUTH DAILY. 30 tablet 11   . LORazepam (ATIVAN) 1 MG tablet Take 1 tablet (1 mg total) by mouth every 8 (eight) hours as needed for anxiety. 60 tablet 5   . metoprolol (LOPRESSOR) 50 MG tablet TAKE 1 TABLET (50 MG TOTAL) BY MOUTH DAILY. 30 tablet 5   . pravastatin (PRAVACHOL) 40 MG tablet TAKE 1 TABLET (40 MG TOTAL) BY MOUTH DAILY. 30 tablet 3   . tamsulosin (FLOMAX) 0.4 MG CAPS capsule Take 1 capsule by mouth daily.     . traMADol (ULTRAM) 50 MG tablet TAKE 1 TABLET BY MOUTH EVERY eight HOURS AS NEEDED FOR MODERATE PAIN 90 tablet 5   . albuterol (PROVENTIL HFA;VENTOLIN HFA) 108 (90 BASE) MCG/ACT inhaler Inhale 2 puffs into the lungs every 6 (six) hours as needed for wheezing or shortness of breath.   Unknown at Unknown time  . albuterol (PROVENTIL) (2.5 MG/3ML) 0.083% nebulizer solution Take 3 mLs (2.5 mg total) by nebulization every 6 (six) hours as needed for wheezing or shortness of breath. 75 mL 12 Unknown at Unknown time  . aspirin 81 MG EC tablet Take 81 mg by mouth daily.  0 Unknown at Unknown time  . budesonide-formoterol (SYMBICORT) 160-4.5 MCG/ACT inhaler Inhale 2 puffs into the lungs 2 (two) times daily. 1 Inhaler 5 Unknown at Unknown time  . guaiFENesin (MUCINEX) 600 MG 12 hr tablet Take 2 tablets (1,200 mg total) by mouth 2 (two) times daily. 20 tablet 1 Unknown at Unknown time   Scheduled: . albuterol  2.5 mg Nebulization Q6H  . antiseptic oral rinse  7 mL Mouth Rinse QID  . aspirin EC  81 mg Oral Daily  . chlorhexidine gluconate  15 mL Mouth Rinse BID  . enoxaparin (LOVENOX) injection  60 mg Subcutaneous Q24H  . erythromycin   Right Eye 3 times per day  . famotidine  20 mg Per Tube Daily  . feeding supplement (PRO-STAT SUGAR FREE 64)  30 mL  Oral TID  . feeding supplement (VITAL HIGH PROTEIN)  1,000 mL Per Tube Q24H  .  furosemide  40 mg Intravenous Q12H  . insulin aspart  0-15 Units Subcutaneous 6 times per day  . insulin glargine  7 Units Subcutaneous BID  . methylPREDNISolone (SOLU-MEDROL) injection  40 mg Intravenous Q12H  . piperacillin-tazobactam (ZOSYN)  IV  3.375 g Intravenous Q8H  . sodium chloride  3 mL Intravenous Q12H   Continuous: . dextrose 5 % and 0.9 % NaCl with KCl 20 mEq/L 70 mL/hr at 12/03/15 0600  . propofol (DIPRIVAN) infusion 40 mcg/kg/min (12/03/15 0600)   UTM:LYYTKPTWS, fentaNYL (SUBLIMAZE) injection, LORazepam, metoprolol  Assesment: He has acute hypoxic respiratory failure. He has bilateral pneumonia. He is not having a lot of secretions. He has failed weaning trials for the last 2 days that seems at least partially because he becomes agitated. Chest x-ray is pending but clinically his pneumonia appears to be improving. He has COPD at baseline. I think he has some element of obesity hypoventilation as well. He was septic on admission and has responded well to fluid resuscitation  He has chronic combined systolic and diastolic heart failure is being treated with Lasix but is positive about 3 L. He does not overtly appear to have pulmonary edema but as mentioned chest x-ray is pending  He has chronic pain at baseline.  He has elevated blood sugars related to his acute illness and steroids Principal Problem:   Acute respiratory failure with hypercapnia (HCC) Active Problems:   Obesity   Chronic pain syndrome   Chronic combined systolic and diastolic congestive heart failure (HCC)   Chronic atrial fibrillation (HCC)   COPD mixed type (HCC)   Bilateral pneumonia   Sepsis ([place])   Hypothermia   Hyperglycemia, drug-induced   Morbid obesity ([place])   Hypokalemia   Conjunctivitis    Plan: I'm going to try him with some morphine and Ativan combination today if he's not able to wean I will discuss with Dr. Jerilee Hoh because I'm going to be out of town for the next 3 days. After  discussion with his nurse from yesterday I think it may be more appropriate to try some Haldol prior to weaning and I will order that    LOS: 5 days   Logan West L 12/03/2015, 7:24 AM

## 2015-12-03 NOTE — Progress Notes (Signed)
[facility] PROGRESS NOTE  Logan StaggersHorace West ZOX:[id number] DOB: 01-Feb-1934 DOA: 11/28/2015 PCP: Mechele ClaudeSTACKS,WARREN, MD  Assessment/Plan: Acute, Ventilatory Dependant Respiratory Failure with Hypercapnea -2/2 HCAP and COPD. -Appreciate pulmonary input and recommendations. -Will attempt weaning again today; failed weaning on 1/10 and 1/11 due to elevated HR and agitation. -Continue steroid titration.  HCAP -Will DC Vanc given negative MRSA PCR. -DC Zosyn today. Has completed 8 days of treatment.  COPD -No wheezes on exam. -Continue steroid titration.  Acute on Chronic Combined CHF -ECHO 9/16: EF 45-50% with diffuse hypokinesis and high ventricular filling pressures. -Significantly volume overloaded on exam. -Continue lasix at 40 mg IV BID, is 1 L negative overnight, altho still remains positive since admission.  A Fib -Chronic. -Per charts, has refused anticoagulation in past. -Rate controlled at present.  Hyperglycemia -Steroid-Induced. -Well controlled. -Continue steroid titration.  Right Conjunctivitis -With purulent discharge, likely bacterial. -Erythromycin ointment for 5 days.    Code Status: Full Code Family Communication: Patient only  Disposition Plan: Attempt weaning today; keep in ICU   Consultants:  Pulmonary   Antibiotics:  None  Subjective: Sedated on vent.  Objective: Filed Vitals:   12/03/15 0830 12/03/15 0845 12/03/15 0847 12/03/15 0900  BP: 118/64 119/67  129/70  Pulse:      Temp:      TempSrc:      Resp: 12 12  15   Height:      Weight:      SpO2:   97%     Intake/Output Summary (Last 24 hours) at 12/03/15 1002 Last data filed at 12/03/15 0952  Gross per 24 hour  Intake 2454.68 ml  Output   3750 ml  Net -1295.32 ml   Filed Weights   12/01/15 0500 12/02/15 0500 12/03/15 0500  Weight: 113.5 kg (250 lb 3.6 oz) 114.3 kg (251 lb 15.8 oz) 112.6 kg (248 lb 3.8 oz)    Exam:   General:  Sedated on vent  Cardiovascular:  Tachy, irregular  Respiratory: Coarse bilateral BS, no wheezing  Abdomen: S/NT/ND/+BS  Extremities: 3++ edema bilaterally, +pulses   Neurologic:  Cannot assess given ongoing sedation  Data Reviewed: Basic Metabolic Panel:  Recent Labs Lab 11/30/15 0423 11/30/15 1945 12/01/15 0512 12/02/15 0438 12/03/15 0437  NA 141 144 144 144 144  K 2.8* 3.8 4.4 4.5 4.2  CL 103 106 108 109 109  CO2 31 29 29 28 30   GLUCOSE 173* 131* 113* 138* 122*  BUN 24* 24* 25* 35* 39*  CREATININE 1.08 0.92 0.85 1.01 0.92  CALCIUM 9.0 9.4 9.0 8.9 8.7*   Liver Function Tests:  Recent Labs Lab 11/28/15 2125  AST 18  ALT 13*  ALKPHOS 75  BILITOT 1.6*  PROT 6.8  ALBUMIN 3.4*   No results for input(s): LIPASE, AMYLASE in the last 168 hours. No results for input(s): AMMONIA in the last 168 hours. CBC:  Recent Labs Lab 11/28/15 2125 11/30/15 0423 12/01/15 0512 12/02/15 0438 12/03/15 0437  WBC 7.3 7.6 8.4 5.4 5.2  NEUTROABS 6.5  --   --   --   --   HGB 12.6* 11.1* 12.0* 12.9* 13.4  HCT 40.2 34.3* 37.6* 40.9 43.7  MCV 97.6 93.2 94.9 96.5 96.0  PLT 159 172 185 151 163   Cardiac Enzymes:  Recent Labs Lab 11/28/15 2125 11/29/15 0121 11/29/15 0643 11/29/15 1312  TROPONINI <0.03 <0.03 <0.03 <0.03   BNP (last 3 results)  Recent Labs  08/27/15 2301 11/04/15 1410 11/28/15 2125  BNP 160.6* 126.5*  128.0*    ProBNP (last 3 results) No results for input(s): PROBNP in the last 8760 hours.  CBG:  Recent Labs Lab 12/02/15 1702 12/02/15 2054 12/03/15 0014 12/03/15 0450 12/03/15 0759  GLUCAP 112* 123* 139* 107* 120*    Recent Results (from the past 240 hour(s))  Blood culture (routine x 2)     Status: None (Preliminary result)   Collection Time: 11/28/15  9:25 PM  Result Value Ref Range Status   Specimen Description LEFT ANTECUBITAL  Final   Special Requests BOTTLES DRAWN AEROBIC ONLY 6CC ONLY  Final   Culture NO GROWTH 2 DAYS  Final   Report Status PENDING  Incomplete    Blood culture (routine x 2)     Status: None (Preliminary result)   Collection Time: 11/28/15  9:25 PM  Result Value Ref Range Status   Specimen Description LEFT ANTECUBITAL  Final   Special Requests BOTTLES DRAWN AEROBIC ONLY 6CC ONLY  Final   Culture NO GROWTH 2 DAYS  Final   Report Status PENDING  Incomplete  MRSA PCR Screening     Status: None   Collection Time: 11/29/15 12:46 AM  Result Value Ref Range Status   MRSA by PCR NEGATIVE NEGATIVE Final    Comment:        The GeneXpert MRSA Assay (FDA approved for NASAL specimens only), is one component of a comprehensive MRSA colonization surveillance program. It is not intended to diagnose MRSA infection nor to guide or monitor treatment for MRSA infections.   Culture, respiratory (NON-Expectorated)     Status: None   Collection Time: 11/29/15  3:18 PM  Result Value Ref Range Status   Specimen Description TRACHEAL ASPIRATE  Final   Special Requests NONE  Final   Gram Stain   Final    NO WBC SEEN NO SQUAMOUS EPITHELIAL CELLS SEEN NO ORGANISMS SEEN Performed at [facility]    Culture   Final    NORMAL OROPHARYNGEAL FLORA Performed at [facility]    Report Status 12/01/2015 FINAL  Final     Studies: Dg Chest Port 1 View  12/03/2015  CLINICAL DATA:  Respiratory failure. EXAM: PORTABLE CHEST 1 VIEW COMPARISON:  One day prior FINDINGS: Endotracheal tube terminates 5.8 cm above carina.Mildly degraded exam due to AP portable technique and patient body habitus. Nasogastric poorly visualized distally. The upper lungs are overpenetrated. Small bilateral pleural effusions persist. Limited evaluation for pneumothorax. Cardiomegaly. Similar bibasilar airspace disease. IMPRESSION: Decreased sensitivity and specificity exam due to technique related factors, as described above. Similar small bilateral pleural effusions with bibasilar airspace opacities. Cardiomegaly without congestive failure. Electronically Signed   By:  Jeronimo Greaves M.D.   On: 12/03/2015 09:11   Dg Chest Port 1 View  12/02/2015  CLINICAL DATA:  Orogastric tube placement EXAM: PORTABLE CHEST 1 VIEW COMPARISON:  Portable exam 1443 hours compared to 12/01/2015 FINDINGS: Exam centered low to include upper abdomen for OG tube placement. Tip of orogastric tube is difficult to visualize but appears project over the mid to distal stomach. If stable heart size and mediastinal contours. Tip of endotracheal tube not visualized by this exam. Bibasilar effusions and atelectasis. Atherosclerotic calcification aorta. Visualized bowel gas pattern unremarkable. IMPRESSION: Tip of orogastric tube appears to project over the mid to distal stomach. Electronically Signed   By: Ulyses Southward M.D.   On: 12/02/2015 15:13    Scheduled Meds: . albuterol  2.5 mg Nebulization Q6H  . antiseptic oral rinse  7 mL  Mouth Rinse QID  . aspirin EC  81 mg Oral Daily  . chlorhexidine gluconate  15 mL Mouth Rinse BID  . enoxaparin (LOVENOX) injection  60 mg Subcutaneous Q24H  . erythromycin   Right Eye 3 times per day  . famotidine  20 mg Per Tube Daily  . feeding supplement (PRO-STAT SUGAR FREE 64)  30 mL Oral TID  . feeding supplement (VITAL HIGH PROTEIN)  1,000 mL Per Tube Q24H  . furosemide  40 mg Intravenous Q12H  . haloperidol lactate  5 mg Intravenous Once  . insulin aspart  0-15 Units Subcutaneous 6 times per day  . insulin glargine  7 Units Subcutaneous BID  . methylPREDNISolone (SOLU-MEDROL) injection  40 mg Intravenous Q12H  . piperacillin-tazobactam (ZOSYN)  IV  3.375 g Intravenous Q8H  . sodium chloride  3 mL Intravenous Q12H   Continuous Infusions: . dextrose 5 % and 0.9 % NaCl with KCl 20 mEq/L 70 mL/hr at 12/03/15 0600  . propofol (DIPRIVAN) infusion 10 mcg/kg/min (12/03/15 0951)    Principal Problem:   Acute respiratory failure with hypercapnia (HCC) Active Problems:   Obesity   Chronic pain syndrome   Chronic combined systolic and diastolic congestive  heart failure (HCC)   Chronic atrial fibrillation (HCC)   COPD mixed type (HCC)   Bilateral pneumonia   Sepsis (HCC)   Hypothermia   Hyperglycemia, drug-induced   Morbid obesity (HCC)   Hypokalemia   Conjunctivitis    Critical care time spent: 25 minutes. Greater than 50% of this time was spent in direct contact with the patient coordinating care.    [name]  [facility] Pager [phone]  If 7PM-7AM, please contact night-coverage at [email or web address], password [facility] 12/03/2015, 10:02 AM  LOS: 5 days

## 2015-12-03 NOTE — Care Management Note (Signed)
Case Management Note  Patient Details  Name: Meredith StaggersHorace Luellen MRN: [id number] Date of Birth: [birth year]   Expected Discharge Date:                  Expected Discharge Plan:     In-House Referral:     Discharge planning Services     Post Acute Care Choice:    Choice offered to:     DME Arranged:    DME Agency:     HH Arranged:    HH Agency:     Status of Service:     Medicare Important Message Given:    Date Medicare IM Given:    Medicare IM give by:    Date Additional Medicare IM Given:    Additional Medicare Important Message give by:     If discussed at Long Length of Stay Meetings, dates discussed:  12/03/2015  Additional Comments:  Malcolm MetroChildress, Wentworth Edelen Demske, RN 12/03/2015, 3:36 PM

## 2015-12-04 LAB — BASIC METABOLIC PANEL
ANION GAP: 7 (ref 5–15)
BUN: 42 mg/dL — AB (ref 6–20)
CHLORIDE: 108 mmol/L (ref 101–111)
CO2: 32 mmol/L (ref 22–32)
Calcium: 8.8 mg/dL — ABNORMAL LOW (ref 8.9–10.3)
Creatinine, Ser: 0.91 mg/dL (ref 0.61–1.24)
GFR calc Af Amer: >60 mL/min (ref >60)
GFR calc non Af Amer: >60 mL/min (ref >60)
GLUCOSE: 90 mg/dL (ref 65–99)
POTASSIUM: 3.5 mmol/L (ref 3.5–5.1)
SODIUM: 147 mmol/L — AB (ref 135–145)

## 2015-12-04 LAB — CBC
HEMATOCRIT: 44.5 % (ref 39.0–52.0)
HEMOGLOBIN: 13.9 g/dL (ref 13.0–17.0)
MCH: 30 pg (ref 26.0–34.0)
MCHC: 31.2 g/dL (ref 30.0–36.0)
MCV: 96.1 fL (ref 78.0–100.0)
Platelets: 154 10*3/uL (ref 150–400)
RBC: 4.63 MIL/uL (ref 4.22–5.81)
RDW: 13.9 % (ref 11.5–15.5)
WBC: 5.8 10*3/uL (ref 4.0–10.5)

## 2015-12-04 LAB — BLOOD GAS, ARTERIAL
ACID-BASE EXCESS: 8.5 mmol/L — AB (ref 0.0–2.0)
Acid-Base Excess: 8.5 mmol/L — ABNORMAL HIGH (ref 0.0–2.0)
BICARBONATE: 30.3 meq/L — AB (ref 20.0–24.0)
Bicarbonate: 31.7 mEq/L — ABNORMAL HIGH (ref 20.0–24.0)
DRAWN BY: 221791
Drawn by: 331001
FIO2: 0.4
FIO2: 40
LHR: 12 {breaths}/min
MECHVT: 600 mL
Mode: POSITIVE
O2 Saturation: 95.2 %
O2 Saturation: 93.6 %
PCO2 ART: 62.3 mmHg — AB (ref 35.0–45.0)
PEEP/CPAP: 5 cmH2O
PO2 ART: 76.8 mmHg — AB (ref 80.0–100.0)
Patient temperature: 98.6
Patient temperature: 37
Pressure support: 5 cmH2O
TCO2: 18 mmol/L (ref 0–100)
TCO2: 19.1 mmol/L (ref 0–100)
pCO2 arterial: 45.2 mmHg — ABNORMAL HIGH (ref 35.0–45.0)
pH, Arterial: 7.471 — ABNORMAL HIGH (ref 7.350–7.450)
pH, Arterial: 7.357 (ref 7.350–7.450)
pO2, Arterial: 76.8 mmHg — ABNORMAL LOW (ref 80.0–100.0)

## 2015-12-04 LAB — GLUCOSE, CAPILLARY
GLUCOSE-CAPILLARY: 86 mg/dL (ref 65–99)
GLUCOSE-CAPILLARY: 93 mg/dL (ref 65–99)
GLUCOSE-CAPILLARY: 104 mg/dL — AB (ref 65–99)
GLUCOSE-CAPILLARY: 103 mg/dL — AB (ref 65–99)
Glucose-Capillary: 76 mg/dL (ref 65–99)
Glucose-Capillary: 96 mg/dL (ref 65–99)

## 2015-12-04 LAB — CULTURE, BLOOD (ROUTINE X 2)
Culture: NO GROWTH
Culture: NO GROWTH

## 2015-12-04 MED ORDER — HALOPERIDOL LACTATE 5 MG/ML IJ SOLN
5.0000 mg | Freq: Once | INTRAMUSCULAR | Status: AC | PRN
  Administered 2015-12-04: 5 mg via INTRAVENOUS
  Filled 2015-12-04: qty 1

## 2015-12-04 MED ORDER — METHYLPREDNISOLONE SODIUM SUCC 40 MG IJ SOLR
20.0000 mg | INTRAMUSCULAR | Status: DC
Start: 2015-12-05 — End: 2015-12-05
  Filled 2015-12-04: qty 1

## 2015-12-04 NOTE — Progress Notes (Signed)
[facility] PROGRESS NOTE  Logan West ZOX:[id number] DOB: [birth year] DOA: 11/28/2015 PCP: Mechele Claude, MD  Assessment/Plan: Acute, Ventilatory Dependant Respiratory Failure with Hypercapnea -2/2 HCAP and COPD. -Appreciate pulmonary input and recommendations. -Will attempt weaning again today; failed weaning on 1/10 and 1/11 due to elevated HR and agitation. -Continue steroid titration. -If unable to wean again today, may need to consider initiation of tube feeds.  HCAP -Completed treatment.  COPD -No wheezes on exam. -Continue steroid titration.  Acute on Chronic Combined CHF -ECHO 9/16: EF 45-50% with diffuse hypokinesis and high ventricular filling pressures. -Significantly volume overloaded on exam. -Continue lasix at 40 mg IV BID, adequate diuresis. Is finally negative overall since admission.  A Fib -Chronic. -Per charts, has refused anticoagulation in past. -Rate controlled at present.  Hyperglycemia -Steroid-Induced. -Well controlled. -Continue steroid titration.  Right Conjunctivitis -With purulent discharge, likely bacterial. -Erythromycin ointment for 5 days.    Code Status: Full Code Family Communication: Patient only  Disposition Plan: Attempt weaning today; keep in ICU   Consultants:  Pulmonary   Antibiotics:  None  Subjective: Sedated on vent.  Objective: Filed Vitals:   12/04/15 0740 12/04/15 0800 12/04/15 0812 12/04/15 0900  BP:  101/57  128/72  Pulse:  76  99  Temp: 97.7 F (36.5 C)     TempSrc:      Resp:  14  14  Height:      Weight:      SpO2:  96% 95% 97%    Intake/Output Summary (Last 24 hours) at 12/04/15 1006 Last data filed at 12/04/15 1001  Gross per 24 hour  Intake 2011.21 ml  Output   4400 ml  Net -2388.79 ml   Filed Weights   12/02/15 0500 12/03/15 0500 12/04/15 0500  Weight: 114.3 kg (251 lb 15.8 oz) 112.6 kg (248 lb 3.8 oz) 110 kg (242 lb 8.1 oz)    Exam:   General:  Sedated on  vent  Cardiovascular: Tachy, irregular  Respiratory: Coarse bilateral BS, no wheezing  Abdomen: S/NT/ND/+BS  Extremities: 2+ edema bilaterally, +pulses   Neurologic:  Cannot assess given ongoing sedation  Data Reviewed: Basic Metabolic Panel:  Recent Labs Lab 11/30/15 1945 12/01/15 0512 12/02/15 0438 12/03/15 0437 12/04/15 0444  NA 144 144 144 144 147*  K 3.8 4.4 4.5 4.2 3.5  CL 106 108 109 109 108  CO2 29 29 28 30  32  GLUCOSE 131* 113* 138* 122* 90  BUN 24* 25* 35* 39* 42*  CREATININE 0.92 0.85 1.01 0.92 0.91  CALCIUM 9.4 9.0 8.9 8.7* 8.8*   Liver Function Tests:  Recent Labs Lab 11/28/15 2125  AST 18  ALT 13*  ALKPHOS 75  BILITOT 1.6*  PROT 6.8  ALBUMIN 3.4*   No results for input(s): LIPASE, AMYLASE in the last 168 hours. No results for input(s): AMMONIA in the last 168 hours. CBC:  Recent Labs Lab 11/28/15 2125 11/30/15 0423 12/01/15 0512 12/02/15 0438 12/03/15 0437 12/04/15 0444  WBC 7.3 7.6 8.4 5.4 5.2 5.8  NEUTROABS 6.5  --   --   --   --   --   HGB 12.6* 11.1* 12.0* 12.9* 13.4 13.9  HCT 40.2 34.3* 37.6* 40.9 43.7 44.5  MCV 97.6 93.2 94.9 96.5 96.0 96.1  PLT 159 172 185 151 163 154   Cardiac Enzymes:  Recent Labs Lab 11/28/15 2125 11/29/15 0121 11/29/15 0643 11/29/15 1312  TROPONINI <0.03 <0.03 <0.03 <0.03   BNP (last 3 results)  Recent Labs  08/27/15 2301 11/04/15 1410 11/28/15 2125  BNP 160.6* 126.5* 128.0*    ProBNP (last 3 results) No results for input(s): PROBNP in the last 8760 hours.  CBG:  Recent Labs Lab 12/03/15 1623 12/03/15 2026 12/04/15 0032 12/04/15 0433 12/04/15 0737  GLUCAP 116* 93 96 86 93    Recent Results (from the past 240 hour(s))  Blood culture (routine x 2)     Status: None   Collection Time: 11/28/15  9:25 PM  Result Value Ref Range Status   Specimen Description LEFT ANTECUBITAL  Final   Special Requests BOTTLES DRAWN AEROBIC ONLY 6CC ONLY  Final   Culture NO GROWTH 6 DAYS  Final    Report Status 12/04/2015 FINAL  Final  Blood culture (routine x 2)     Status: None   Collection Time: 11/28/15  9:25 PM  Result Value Ref Range Status   Specimen Description LEFT ANTECUBITAL  Final   Special Requests BOTTLES DRAWN AEROBIC ONLY 6CC ONLY  Final   Culture NO GROWTH 6 DAYS  Final   Report Status 12/04/2015 FINAL  Final  MRSA PCR Screening     Status: None   Collection Time: 11/29/15 12:46 AM  Result Value Ref Range Status   MRSA by PCR NEGATIVE NEGATIVE Final    Comment:        The GeneXpert MRSA Assay (FDA approved for NASAL specimens only), is one component of a comprehensive MRSA colonization surveillance program. It is not intended to diagnose MRSA infection nor to guide or monitor treatment for MRSA infections.   Culture, respiratory (NON-Expectorated)     Status: None   Collection Time: 11/29/15  3:18 PM  Result Value Ref Range Status   Specimen Description TRACHEAL ASPIRATE  Final   Special Requests NONE  Final   Gram Stain   Final    NO WBC SEEN NO SQUAMOUS EPITHELIAL CELLS SEEN NO ORGANISMS SEEN Performed at [facility]    Culture   Final    NORMAL OROPHARYNGEAL FLORA Performed at [facility]    Report Status 12/01/2015 FINAL  Final     Studies: Dg Chest Port 1 View  12/03/2015  CLINICAL DATA:  Respiratory failure. EXAM: PORTABLE CHEST 1 VIEW COMPARISON:  One day prior FINDINGS: Endotracheal tube terminates 5.8 cm above carina.Mildly degraded exam due to AP portable technique and patient body habitus. Nasogastric poorly visualized distally. The upper lungs are overpenetrated. Small bilateral pleural effusions persist. Limited evaluation for pneumothorax. Cardiomegaly. Similar bibasilar airspace disease. IMPRESSION: Decreased sensitivity and specificity exam due to technique related factors, as described above. Similar small bilateral pleural effusions with bibasilar airspace opacities. Cardiomegaly without congestive failure.  Electronically Signed   By: Jeronimo GreavesKyle  Talbot M.D.   On: 12/03/2015 09:11   Dg Chest Port 1 View  12/02/2015  CLINICAL DATA:  Orogastric tube placement EXAM: PORTABLE CHEST 1 VIEW COMPARISON:  Portable exam 1443 hours compared to 12/01/2015 FINDINGS: Exam centered low to include upper abdomen for OG tube placement. Tip of orogastric tube is difficult to visualize but appears project over the mid to distal stomach. If stable heart size and mediastinal contours. Tip of endotracheal tube not visualized by this exam. Bibasilar effusions and atelectasis. Atherosclerotic calcification aorta. Visualized bowel gas pattern unremarkable. IMPRESSION: Tip of orogastric tube appears to project over the mid to distal stomach. Electronically Signed   By: Ulyses SouthwardMark  Boles M.D.   On: 12/02/2015 15:13    Scheduled Meds: . albuterol  2.5 mg Nebulization Q6H  .  antiseptic oral rinse  7 mL Mouth Rinse QID  . aspirin EC  81 mg Oral Daily  . chlorhexidine gluconate  15 mL Mouth Rinse BID  . enoxaparin (LOVENOX) injection  60 mg Subcutaneous Q24H  . erythromycin   Right Eye 3 times per day  . famotidine  20 mg Per Tube Daily  . feeding supplement (PRO-STAT SUGAR FREE 64)  30 mL Oral TID  . feeding supplement (VITAL HIGH PROTEIN)  1,000 mL Per Tube Q24H  . furosemide  40 mg Intravenous Q12H  . insulin aspart  0-15 Units Subcutaneous 6 times per day  . insulin glargine  7 Units Subcutaneous BID  . methylPREDNISolone (SOLU-MEDROL) injection  40 mg Intravenous Q24H  . sodium chloride  3 mL Intravenous Q12H   Continuous Infusions: . dextrose 5 % and 0.9 % NaCl with KCl 20 mEq/L 70 mL/hr at 12/04/15 1001  . propofol (DIPRIVAN) infusion 15 mcg/kg/min (12/04/15 0854)    Principal Problem:   Acute respiratory failure with hypercapnia (HCC) Active Problems:   Obesity   Chronic pain syndrome   Chronic combined systolic and diastolic congestive heart failure (HCC)   Chronic atrial fibrillation (HCC)   COPD mixed type (HCC)    Bilateral pneumonia   Sepsis (HCC)   Hypothermia   Hyperglycemia, drug-induced   Morbid obesity (HCC)   Hypokalemia   Conjunctivitis    Critical care time spent: 25 minutes. Greater than 50% of this time was spent in direct contact with the patient coordinating care.    [name]  [facility] Pager [phone]  If 7PM-7AM, please contact night-coverage at [email or web address], password [facility] 12/04/2015, 10:06 AM  LOS: 6 days

## 2015-12-04 NOTE — Progress Notes (Signed)
Pt and family have decided that they/he does not want to go back on the ventilator.

## 2015-12-04 NOTE — Care Management Important Message (Signed)
Important Message  Patient Details  Name: Logan West MRN: [id number] Date of Birth: September 25, 1934   Medicare Important Message Given:  Yes    Malcolm MetroChildress, Treana Lacour Demske, RN 12/04/2015, 12:00 PM

## 2015-12-05 LAB — GLUCOSE, CAPILLARY
GLUCOSE-CAPILLARY: 78 mg/dL (ref 65–99)
GLUCOSE-CAPILLARY: 102 mg/dL — AB (ref 65–99)
GLUCOSE-CAPILLARY: 86 mg/dL (ref 65–99)
Glucose-Capillary: 117 mg/dL — ABNORMAL HIGH (ref 65–99)
Glucose-Capillary: 77 mg/dL (ref 65–99)
Glucose-Capillary: 82 mg/dL (ref 65–99)

## 2015-12-05 MED ORDER — METOPROLOL TARTRATE 25 MG PO TABS
25.0000 mg | ORAL_TABLET | Freq: Two times a day (BID) | ORAL | Status: DC
  Administered 2015-12-05 – 2015-12-08 (×7): 25 mg via ORAL
  Filled 2015-12-05 (×7): qty 1

## 2015-12-05 MED ORDER — PREDNISONE 20 MG PO TABS
30.0000 mg | ORAL_TABLET | Freq: Every day | ORAL | Status: DC
  Administered 2015-12-06 – 2015-12-07 (×2): 30 mg via ORAL
  Filled 2015-12-05 (×2): qty 1

## 2015-12-05 NOTE — Progress Notes (Signed)
[facility] PROGRESS NOTE  Logan West ZOX:[id number] DOB: December 02, 1933 DOA: 11/28/2015 PCP: Mechele Claude, MD  Assessment/Plan: Acute, Ventilatory Dependant Respiratory Failure with Hypercapnea -2/2 HCAP and COPD. -Successfully extubated 1/13. -Patient and family have now decided on DO NOT RESUSCITATE. -Continue steroid titration. -Request PT evaluation, may benefit from palliative evaluation.  HCAP -Completed treatment.  COPD -No wheezes on exam. -Continue steroid titration.  Acute on Chronic Combined CHF -ECHO 9/16: EF 45-50% with diffuse hypokinesis and high ventricular filling pressures. -Significantly volume overloaded on exam, although improved. -Continue lasix at 40 mg IV BID, adequate diuresis. Is finally negative overall since admission, greater than 3 L negative.  A Fib -Chronic. -Per charts, has refused anticoagulation in past. -Rate controlled at present.  Hyperglycemia -Steroid-Induced. -Well controlled. -Continue steroid titration.  Right Conjunctivitis -With purulent discharge, likely bacterial. -Much improved. -Erythromycin ointment for 5 days.    Code Status: Full Code Family Communication: Patient only  Disposition Plan: Obtain PT evaluation, will likely need SNF, consider palliative care evaluation.   Consultants:  Pulmonary   Antibiotics:  None  Subjective: Awake, oriented, however has some mild hallucinations stating "he sees a bird in the room flying around"  Objective: Filed Vitals:   12/05/15 0800 12/05/15 0832 12/05/15 0900 12/05/15 1000  BP: 141/101  153/97 143/73  Pulse: 109  123 109  Temp:  98.5 F (36.9 C)    TempSrc:  Axillary    Resp: 24  20 23   Height:      Weight:      SpO2: 97%  95% 93%    Intake/Output Summary (Last 24 hours) at 12/05/15 1128 Last data filed at 12/05/15 1000  Gross per 24 hour  Intake   1755 ml  Output   5100 ml  Net  -3345 ml   Filed Weights   12/03/15 0500 12/04/15 0500  12/05/15 0400  Weight: 112.6 kg (248 lb 3.8 oz) 110 kg (242 lb 8.1 oz) 110.5 kg (243 lb 9.7 oz)    Exam:   General:  Awake, alert, confused  Cardiovascular: Tachy, irregular  Respiratory: Coarse bilateral BS, no wheezing  Abdomen: S/NT/ND/+BS  Extremities: 2+ edema bilaterally, +pulses   Neurologic:  Moves all 4 spontaneously  Data Reviewed: Basic Metabolic Panel:  Recent Labs Lab 11/30/15 1945 12/01/15 0512 12/02/15 0438 12/03/15 0437 12/04/15 0444  NA 144 144 144 144 147*  K 3.8 4.4 4.5 4.2 3.5  CL 106 108 109 109 108  CO2 29 29 28 30  32  GLUCOSE 131* 113* 138* 122* 90  BUN 24* 25* 35* 39* 42*  CREATININE 0.92 0.85 1.01 0.92 0.91  CALCIUM 9.4 9.0 8.9 8.7* 8.8*   Liver Function Tests:  Recent Labs Lab 11/28/15 2125  AST 18  ALT 13*  ALKPHOS 75  BILITOT 1.6*  PROT 6.8  ALBUMIN 3.4*   No results for input(s): LIPASE, AMYLASE in the last 168 hours. No results for input(s): AMMONIA in the last 168 hours. CBC:  Recent Labs Lab 11/28/15 2125 11/30/15 0423 12/01/15 0512 12/02/15 0438 12/03/15 0437 12/04/15 0444  WBC 7.3 7.6 8.4 5.4 5.2 5.8  NEUTROABS 6.5  --   --   --   --   --   HGB 12.6* 11.1* 12.0* 12.9* 13.4 13.9  HCT 40.2 34.3* 37.6* 40.9 43.7 44.5  MCV 97.6 93.2 94.9 96.5 96.0 96.1  PLT 159 172 185 151 163 154   Cardiac Enzymes:  Recent Labs Lab 11/28/15 2125 11/29/15 0121 11/29/15 0643 11/29/15 1312  TROPONINI <0.03 <0.03 <0.03 <0.03   BNP (last 3 results)  Recent Labs  08/27/15 2301 11/04/15 1410 11/28/15 2125  BNP 160.6* 126.5* 128.0*    ProBNP (last 3 results) No results for input(s): PROBNP in the last 8760 hours.  CBG:  Recent Labs Lab 12/04/15 1619 12/04/15 2028 12/05/15 0006 12/05/15 0502 12/05/15 0805  GLUCAP 103* 76 77 78 82    Recent Results (from the past 240 hour(s))  Blood culture (routine x 2)     Status: None   Collection Time: 11/28/15  9:25 PM  Result Value Ref Range Status   Specimen  Description LEFT ANTECUBITAL  Final   Special Requests BOTTLES DRAWN AEROBIC ONLY 6CC ONLY  Final   Culture NO GROWTH 6 DAYS  Final   Report Status 12/04/2015 FINAL  Final  Blood culture (routine x 2)     Status: None   Collection Time: 11/28/15  9:25 PM  Result Value Ref Range Status   Specimen Description LEFT ANTECUBITAL  Final   Special Requests BOTTLES DRAWN AEROBIC ONLY 6CC ONLY  Final   Culture NO GROWTH 6 DAYS  Final   Report Status 12/04/2015 FINAL  Final  MRSA PCR Screening     Status: None   Collection Time: 11/29/15 12:46 AM  Result Value Ref Range Status   MRSA by PCR NEGATIVE NEGATIVE Final    Comment:        The GeneXpert MRSA Assay (FDA approved for NASAL specimens only), is one component of a comprehensive MRSA colonization surveillance program. It is not intended to diagnose MRSA infection nor to guide or monitor treatment for MRSA infections.   Culture, respiratory (NON-Expectorated)     Status: None   Collection Time: 11/29/15  3:18 PM  Result Value Ref Range Status   Specimen Description TRACHEAL ASPIRATE  Final   Special Requests NONE  Final   Gram Stain   Final    NO WBC SEEN NO SQUAMOUS EPITHELIAL CELLS SEEN NO ORGANISMS SEEN Performed at [facility]    Culture   Final    NORMAL OROPHARYNGEAL FLORA Performed at [facility]    Report Status 12/01/2015 FINAL  Final     Studies: No results found.  Scheduled Meds: . albuterol  2.5 mg Nebulization Q6H  . antiseptic oral rinse  7 mL Mouth Rinse QID  . aspirin EC  81 mg Oral Daily  . chlorhexidine gluconate  15 mL Mouth Rinse BID  . enoxaparin (LOVENOX) injection  60 mg Subcutaneous Q24H  . erythromycin   Right Eye 3 times per day  . famotidine  20 mg Per Tube Daily  . feeding supplement (PRO-STAT SUGAR FREE 64)  30 mL Oral TID  . feeding supplement (VITAL HIGH PROTEIN)  1,000 mL Per Tube Q24H  . furosemide  40 mg Intravenous Q12H  . insulin aspart  0-15 Units Subcutaneous 6  times per day  . insulin glargine  7 Units Subcutaneous BID  . methylPREDNISolone (SOLU-MEDROL) injection  20 mg Intravenous Q24H  . metoprolol tartrate  25 mg Oral BID  . sodium chloride  3 mL Intravenous Q12H   Continuous Infusions: . dextrose 5 % and 0.9 % NaCl with KCl 20 mEq/L 70 mL/hr at 12/05/15 1000    Principal Problem:   Acute respiratory failure with hypercapnia (HCC) Active Problems:   Obesity   Chronic pain syndrome   Chronic combined systolic and diastolic congestive heart failure (HCC)   Chronic atrial fibrillation (HCC)  COPD mixed type (HCC)   Bilateral pneumonia   Sepsis (HCC)   Hypothermia   Hyperglycemia, drug-induced   Morbid obesity (HCC)   Hypokalemia   Conjunctivitis    Time spent: 25 minutes. Greater than 50% of this time was spent in direct contact with the patient coordinating care.    [name]  [facility] Pager [phone]  If 7PM-7AM, please contact night-coverage at [email or web address], password [facility] 12/05/2015, 11:28 AM  LOS: 7 days

## 2015-12-06 LAB — GLUCOSE, CAPILLARY
GLUCOSE-CAPILLARY: 121 mg/dL — AB (ref 65–99)
GLUCOSE-CAPILLARY: 80 mg/dL (ref 65–99)
GLUCOSE-CAPILLARY: 95 mg/dL (ref 65–99)
GLUCOSE-CAPILLARY: 99 mg/dL (ref 65–99)
Glucose-Capillary: 98 mg/dL (ref 65–99)
Glucose-Capillary: 109 mg/dL — ABNORMAL HIGH (ref 65–99)
Glucose-Capillary: 88 mg/dL (ref 65–99)

## 2015-12-06 LAB — CBC
HCT: 49.7 % (ref 39.0–52.0)
Hemoglobin: 15.5 g/dL (ref 13.0–17.0)
MCH: 30.2 pg (ref 26.0–34.0)
MCHC: 31.2 g/dL (ref 30.0–36.0)
MCV: 96.7 fL (ref 78.0–100.0)
Platelets: 178 10*3/uL (ref 150–400)
RBC: 5.14 MIL/uL (ref 4.22–5.81)
RDW: 13.6 % (ref 11.5–15.5)
WBC: 8 10*3/uL (ref 4.0–10.5)

## 2015-12-06 LAB — BASIC METABOLIC PANEL
ANION GAP: 11 (ref 5–15)
BUN: 29 mg/dL — ABNORMAL HIGH (ref 6–20)
CHLORIDE: 105 mmol/L (ref 101–111)
CO2: 30 mmol/L (ref 22–32)
Calcium: 9.1 mg/dL (ref 8.9–10.3)
Creatinine, Ser: 0.8 mg/dL (ref 0.61–1.24)
GFR calc Af Amer: >60 mL/min (ref >60)
GLUCOSE: 91 mg/dL (ref 65–99)
POTASSIUM: 4.3 mmol/L (ref 3.5–5.1)
Sodium: 146 mmol/L — ABNORMAL HIGH (ref 135–145)

## 2015-12-06 NOTE — Progress Notes (Signed)
[facility] PROGRESS NOTE  Crews Mccollam WUJ:[id number] DOB: 1934/05/31 DOA: 11/28/2015 PCP: Mechele Claude, MD  Assessment/Plan: Acute, Ventilatory Dependant Respiratory Failure with Hypercapnea -2/2 HCAP and COPD. -Successfully extubated 1/13. -Patient and family have now decided on DO NOT RESUSCITATE. -Continue steroid titration. -Request PT evaluation, may benefit from palliative evaluation. Will likely need SNF.  HCAP -Completed treatment.  COPD -No wheezes on exam. -Continue steroid titration.  Acute on Chronic Combined CHF -ECHO 9/16: EF 45-50% with diffuse hypokinesis and high ventricular filling pressures. -Volume status has improved. -Continue lasix at 40 mg IV BID today, consider switching to daily in am. -4.8 L negative since admission.  A Fib -Chronic. -Per charts, has refused anticoagulation in past. -Rate controlled at present.  Hyperglycemia -Steroid-Induced. -Well controlled. -Continue steroid titration.  Right Conjunctivitis -With purulent discharge, likely bacterial. -Much improved. -Erythromycin ointment for 5 days.    Code Status: Full Code Family Communication: Patient only  Disposition Plan: Obtain PT evaluation, will likely need SNF, consider palliative care evaluation.   Consultants:  Pulmonary   Antibiotics:  None  Subjective: Awake, oriented, however has some mild hallucinations stating "he sees a bird in the room flying around"  Objective: Filed Vitals:   12/06/15 0756 12/06/15 0800 12/06/15 0818 12/06/15 0900  BP:  129/89  141/73  Pulse:  109  107  Temp:   98.9 F (37.2 C)   TempSrc:   Axillary   Resp:  23  22  Height:      Weight:      SpO2: 96% 97%  96%    Intake/Output Summary (Last 24 hours) at 12/06/15 1124 Last data filed at 12/06/15 0830  Gross per 24 hour  Intake   1870 ml  Output   3575 ml  Net  -1705 ml   Filed Weights   12/04/15 0500 12/05/15 0400 12/06/15 0500  Weight: 110 kg (242 lb  8.1 oz) 110.5 kg (243 lb 9.7 oz) 103.5 kg (228 lb 2.8 oz)    Exam:   General:  Awake, alert, confused  Cardiovascular: Tachy, irregular  Respiratory: Coarse bilateral BS, no wheezing  Abdomen: S/NT/ND/+BS  Extremities: 2+ edema bilaterally, +pulses   Neurologic:  Moves all 4 spontaneously  Data Reviewed: Basic Metabolic Panel:  Recent Labs Lab 12/01/15 0512 12/02/15 0438 12/03/15 0437 12/04/15 0444 12/06/15 0446  NA 144 144 144 147* 146*  K 4.4 4.5 4.2 3.5 4.3  CL 108 109 109 108 105  CO2 29 28 30  32 30  GLUCOSE 113* 138* 122* 90 91  BUN 25* 35* 39* 42* 29*  CREATININE 0.85 1.01 0.92 0.91 0.80  CALCIUM 9.0 8.9 8.7* 8.8* 9.1   Liver Function Tests: No results for input(s): AST, ALT, ALKPHOS, BILITOT, PROT, ALBUMIN in the last 168 hours. No results for input(s): LIPASE, AMYLASE in the last 168 hours. No results for input(s): AMMONIA in the last 168 hours. CBC:  Recent Labs Lab 12/01/15 0512 12/02/15 0438 12/03/15 0437 12/04/15 0444 12/06/15 0729  WBC 8.4 5.4 5.2 5.8 8.0  HGB 12.0* 12.9* 13.4 13.9 15.5  HCT 37.6* 40.9 43.7 44.5 49.7  MCV 94.9 96.5 96.0 96.1 96.7  PLT 185 151 163 154 178   Cardiac Enzymes:  Recent Labs Lab 11/29/15 1312  TROPONINI <0.03   BNP (last 3 results)  Recent Labs  08/27/15 2301 11/04/15 1410 11/28/15 2125  BNP 160.6* 126.5* 128.0*    ProBNP (last 3 results) No results for input(s): PROBNP in the last 8760 hours.  CBG:  Recent Labs Lab 12/05/15 1650 12/05/15 1955 12/06/15 0012 12/06/15 0402 12/06/15 0757  GLUCAP 86 117* 80 121* 99    Recent Results (from the past 240 hour(s))  Blood culture (routine x 2)     Status: None   Collection Time: 11/28/15  9:25 PM  Result Value Ref Range Status   Specimen Description LEFT ANTECUBITAL  Final   Special Requests BOTTLES DRAWN AEROBIC ONLY 6CC ONLY  Final   Culture NO GROWTH 6 DAYS  Final   Report Status 12/04/2015 FINAL  Final  Blood culture (routine x 2)      Status: None   Collection Time: 11/28/15  9:25 PM  Result Value Ref Range Status   Specimen Description LEFT ANTECUBITAL  Final   Special Requests BOTTLES DRAWN AEROBIC ONLY 6CC ONLY  Final   Culture NO GROWTH 6 DAYS  Final   Report Status 12/04/2015 FINAL  Final  MRSA PCR Screening     Status: None   Collection Time: 11/29/15 12:46 AM  Result Value Ref Range Status   MRSA by PCR NEGATIVE NEGATIVE Final    Comment:        The GeneXpert MRSA Assay (FDA approved for NASAL specimens only), is one component of a comprehensive MRSA colonization surveillance program. It is not intended to diagnose MRSA infection nor to guide or monitor treatment for MRSA infections.   Culture, respiratory (NON-Expectorated)     Status: None   Collection Time: 11/29/15  3:18 PM  Result Value Ref Range Status   Specimen Description TRACHEAL ASPIRATE  Final   Special Requests NONE  Final   Gram Stain   Final    NO WBC SEEN NO SQUAMOUS EPITHELIAL CELLS SEEN NO ORGANISMS SEEN Performed at [facility]    Culture   Final    NORMAL OROPHARYNGEAL FLORA Performed at [facility]    Report Status 12/01/2015 FINAL  Final     Studies: No results found.  Scheduled Meds: . albuterol  2.5 mg Nebulization Q6H  . aspirin EC  81 mg Oral Daily  . enoxaparin (LOVENOX) injection  60 mg Subcutaneous Q24H  . erythromycin   Right Eye 3 times per day  . famotidine  20 mg Per Tube Daily  . furosemide  40 mg Intravenous Q12H  . insulin aspart  0-15 Units Subcutaneous 6 times per day  . insulin glargine  7 Units Subcutaneous BID  . metoprolol tartrate  25 mg Oral BID  . predniSONE  30 mg Oral Q breakfast  . sodium chloride  3 mL Intravenous Q12H   Continuous Infusions: . dextrose 5 % and 0.9 % NaCl with KCl 20 mEq/L 70 mL/hr at 12/06/15 1032    Principal Problem:   Acute respiratory failure with hypercapnia (HCC) Active Problems:   Obesity   Chronic pain syndrome   Chronic combined  systolic and diastolic congestive heart failure (HCC)   Chronic atrial fibrillation (HCC)   COPD mixed type (HCC)   Bilateral pneumonia   Sepsis (HCC)   Hypothermia   Hyperglycemia, drug-induced   Morbid obesity (HCC)   Hypokalemia   Conjunctivitis    Time spent: 25 minutes. Greater than 50% of this time was spent in direct contact with the patient coordinating care.    Chaya JanHERNANDEZ ACOSTA,ESTELA  [facility] Pager [phone]  If 7PM-7AM, please contact night-coverage at [email or web address], password [email or web address] 12/06/2015, 11:24 AM  LOS: 8 days

## 2015-12-06 NOTE — Progress Notes (Signed)
ANTICOAGULATION CONSULT NOTE - Initial Consult  Pharmacy Consult for Lovenox Indication: VTE prophylaxis  No Known Allergies  Patient Measurements: Height: 6' (182.9 cm) Weight: 228 lb 2.8 oz (103.5 kg) IBW/kg (Calculated) : 77.6  In computer weight has been reported differently: 228 - 251 lbs  Vital Signs: Temp: 98.9 F (37.2 C) (01/15 0818) Temp Source: Axillary (01/15 0818) BP: 135/94 mmHg (01/15 0600) Pulse Rate: 108 (01/15 0600)  Labs:  Recent Labs  12/04/15 0444 12/06/15 0446 12/06/15 0729  HGB 13.9  --  15.5  HCT 44.5  --  49.7  PLT 154  --  178  CREATININE 0.91 0.80  --    Estimated Creatinine Clearance: 93.2 mL/min (by C-G formula based on Cr of 0.8).   Medical History: Past Medical History  Diagnosis Date  . Hypertension     x30 years  . Hyperlipidemia     x 7-8  . COPD (chronic obstructive pulmonary disease) (HCC)   . Obesity   . Chronic pain   . CHF (congestive heart failure) (HCC)   . Atrial fibrillation [facility](HCC)    Assessment: 81yo obese male.  Renal fxn OK.  Lovenox for VTE px.  Goal of Therapy:  VTE prophylaxis Monitor platelets by anticoagulation protocol: Yes   Plan:  Lovenox 60mg  SQ q24hrs Monitor labs / CBC  Margo AyeHall, Lionell Matuszak A 12/06/2015,8:20 AM

## 2015-12-07 ENCOUNTER — Encounter (HOSPITAL_COMMUNITY): Payer: Self-pay | Admitting: Primary Care

## 2015-12-07 DIAGNOSIS — Z515 Encounter for palliative care: Secondary | ICD-10-CM | POA: Insufficient documentation

## 2015-12-07 LAB — CBC
HCT: 46.6 % (ref 39.0–52.0)
Hemoglobin: 14.7 g/dL (ref 13.0–17.0)
MCH: 29.8 pg (ref 26.0–34.0)
MCHC: 31.5 g/dL (ref 30.0–36.0)
MCV: 94.5 fL (ref 78.0–100.0)
PLATELETS: 174 10*3/uL (ref 150–400)
RBC: 4.93 MIL/uL (ref 4.22–5.81)
RDW: 13.4 % (ref 11.5–15.5)
WBC: 8.4 10*3/uL (ref 4.0–10.5)

## 2015-12-07 LAB — BASIC METABOLIC PANEL
Anion gap: 8 (ref 5–15)
BUN: 30 mg/dL — AB (ref 6–20)
CO2: 34 mmol/L — ABNORMAL HIGH (ref 22–32)
CREATININE: 0.89 mg/dL (ref 0.61–1.24)
Calcium: 9.2 mg/dL (ref 8.9–10.3)
Chloride: 101 mmol/L (ref 101–111)
GFR calc Af Amer: >60 mL/min (ref >60)
Glucose, Bld: 92 mg/dL (ref 65–99)
Potassium: 3.5 mmol/L (ref 3.5–5.1)
SODIUM: 143 mmol/L (ref 135–145)

## 2015-12-07 LAB — GLUCOSE, CAPILLARY
GLUCOSE-CAPILLARY: 83 mg/dL (ref 65–99)
Glucose-Capillary: 62 mg/dL — ABNORMAL LOW (ref 65–99)
Glucose-Capillary: 92 mg/dL (ref 65–99)
Glucose-Capillary: 72 mg/dL (ref 65–99)
Glucose-Capillary: 81 mg/dL (ref 65–99)
Glucose-Capillary: 140 mg/dL — ABNORMAL HIGH (ref 65–99)

## 2015-12-07 MED ORDER — PREDNISONE 20 MG PO TABS
20.0000 mg | ORAL_TABLET | Freq: Every day | ORAL | Status: DC
  Administered 2015-12-08: 20 mg via ORAL
  Filled 2015-12-07: qty 1

## 2015-12-07 MED ORDER — FUROSEMIDE 40 MG PO TABS
40.0000 mg | ORAL_TABLET | Freq: Every day | ORAL | Status: DC
  Administered 2015-12-08: 40 mg via ORAL
  Filled 2015-12-07: qty 1

## 2015-12-07 MED ORDER — ALBUTEROL SULFATE (2.5 MG/3ML) 0.083% IN NEBU
2.5000 mg | INHALATION_SOLUTION | Freq: Three times a day (TID) | RESPIRATORY_TRACT | Status: DC
  Administered 2015-12-08: 2.5 mg via RESPIRATORY_TRACT
  Filled 2015-12-07: qty 3

## 2015-12-07 MED ORDER — INSULIN GLARGINE 100 UNIT/ML ~~LOC~~ SOLN: 7.0000 [IU] | Freq: Every day | SUBCUTANEOUS | Status: DC

## 2015-12-07 NOTE — Progress Notes (Addendum)
[facility] PROGRESS NOTE  Logan West ZOX:[id number] DOB: 09-28-1934 DOA: 11/28/2015 PCP: Logan ClaudeSTACKS,WARREN, MD  Assessment/Plan: Acute, Ventilatory Dependant Respiratory Failure with Hypercapnea -2/2 HCAP and COPD. -Successfully extubated 1/13. -Patient and family have now decided on DO NOT RESUSCITATE. -Continue steroid titration. -Request PT evaluation, may benefit from palliative evaluation. Will likely need SNF.  HCAP -Completed treatment.  COPD -No wheezes on exam. -Continue steroid titration.  Acute on Chronic Combined CHF -ECHO 9/16: EF 45-50% with diffuse hypokinesis and high ventricular filling pressures. -Volume status has improved. -7 L negative since admission. -Change lasix to 40 mg PO daily,  A Fib -Chronic. -Per charts, has refused anticoagulation in past. -Rate controlled at present.  Hyperglycemia -Steroid-Induced. -Well controlled. -Continue steroid titration. -DC lantus.  Right Conjunctivitis -With purulent discharge, likely bacterial. -Much improved. -Erythromycin ointment for 5 days.    Code Status: Full Code Family Communication: Patient only  Disposition Plan: Obtain PT evaluation, will likely need SNF, consider palliative care evaluation.   Consultants:  Pulmonary   Antibiotics:  None  Subjective: Awake, oriented, still with some visual hallucinations  Objective: Filed Vitals:   12/07/15 0600 12/07/15 0700 12/07/15 0800 12/07/15 0900  BP: 118/95 141/79 124/69 101/63  Pulse: 97 107 131 105  Temp:   97.1 F (36.2 C)   TempSrc:   Oral   Resp: 20 21 23 19   Height:      Weight:      SpO2: 96% 98% 95% 96%    Intake/Output Summary (Last 24 hours) at 12/07/15 1008 Last data filed at 12/07/15 0850  Gross per 24 hour  Intake   1800 ml  Output   4000 ml  Net  -2200 ml   Filed Weights   12/05/15 0400 12/06/15 0500 12/07/15 0500  Weight: 110.5 kg (243 lb 9.7 oz) 103.5 kg (228 lb 2.8 oz) 102.4 kg (225 lb 12 oz)     Exam:   General:  Awake, alert, confused  Cardiovascular: Tachy, irregular  Respiratory: Coarse bilateral BS, no wheezing  Abdomen: S/NT/ND/+BS  Extremities: 1+ edema bilaterally, +pulses   Neurologic:  Moves all 4 spontaneously  Data Reviewed: Basic Metabolic Panel:  Recent Labs Lab 12/02/15 0438 12/03/15 0437 12/04/15 0444 12/06/15 0446 12/07/15 0452  NA 144 144 147* 146* 143  K 4.5 4.2 3.5 4.3 3.5  CL 109 109 108 105 101  CO2 28 30 32 30 34*  GLUCOSE 138* 122* 90 91 92  BUN 35* 39* 42* 29* 30*  CREATININE 1.01 0.92 0.91 0.80 0.89  CALCIUM 8.9 8.7* 8.8* 9.1 9.2   Liver Function Tests: No results for input(s): AST, ALT, ALKPHOS, BILITOT, PROT, ALBUMIN in the last 168 hours. No results for input(s): LIPASE, AMYLASE in the last 168 hours. No results for input(s): AMMONIA in the last 168 hours. CBC:  Recent Labs Lab 12/02/15 0438 12/03/15 0437 12/04/15 0444 12/06/15 0729 12/07/15 0452  WBC 5.4 5.2 5.8 8.0 8.4  HGB 12.9* 13.4 13.9 15.5 14.7  HCT 40.9 43.7 44.5 49.7 46.6  MCV 96.5 96.0 96.1 96.7 94.5  PLT 151 163 154 178 174   Cardiac Enzymes: No results for input(s): CKTOTAL, CKMB, CKMBINDEX, TROPONINI in the last 168 hours. BNP (last 3 results)  Recent Labs  08/27/15 2301 11/04/15 1410 11/28/15 2125  BNP 160.6* 126.5* 128.0*    ProBNP (last 3 results) No results for input(s): PROBNP in the last 8760 hours.  CBG:  Recent Labs Lab 12/06/15 1939 12/06/15 2351 12/07/15 0350 12/07/15 0418 12/07/15  0748  GLUCAP 88 95 62* 92 72    Recent Results (from the past 240 hour(s))  Blood culture (routine x 2)     Status: None   Collection Time: 11/28/15  9:25 PM  Result Value Ref Range Status   Specimen Description LEFT ANTECUBITAL  Final   Special Requests BOTTLES DRAWN AEROBIC ONLY 6CC ONLY  Final   Culture NO GROWTH 6 DAYS  Final   Report Status 12/04/2015 FINAL  Final  Blood culture (routine x 2)     Status: None   Collection Time:  11/28/15  9:25 PM  Result Value Ref Range Status   Specimen Description LEFT ANTECUBITAL  Final   Special Requests BOTTLES DRAWN AEROBIC ONLY 6CC ONLY  Final   Culture NO GROWTH 6 DAYS  Final   Report Status 12/04/2015 FINAL  Final  MRSA PCR Screening     Status: None   Collection Time: 11/29/15 12:46 AM  Result Value Ref Range Status   MRSA by PCR NEGATIVE NEGATIVE Final    Comment:        The GeneXpert MRSA Assay (FDA approved for NASAL specimens only), is one component of a comprehensive MRSA colonization surveillance program. It is not intended to diagnose MRSA infection nor to guide or monitor treatment for MRSA infections.   Culture, respiratory (NON-Expectorated)     Status: None   Collection Time: 11/29/15  3:18 PM  Result Value Ref Range Status   Specimen Description TRACHEAL ASPIRATE  Final   Special Requests NONE  Final   Gram Stain   Final    NO WBC SEEN NO SQUAMOUS EPITHELIAL CELLS SEEN NO ORGANISMS SEEN Performed at [facility]    Culture   Final    NORMAL OROPHARYNGEAL FLORA Performed at [facility]    Report Status 12/01/2015 FINAL  Final     Studies: No results found.  Scheduled Meds: . albuterol  2.5 mg Nebulization Q6H  . aspirin EC  81 mg Oral Daily  . enoxaparin (LOVENOX) injection  60 mg Subcutaneous Q24H  . famotidine  20 mg Per Tube Daily  . furosemide  40 mg Intravenous Q12H  . insulin aspart  0-15 Units Subcutaneous 6 times per day  . insulin glargine  7 Units Subcutaneous BID  . metoprolol tartrate  25 mg Oral BID  . predniSONE  30 mg Oral Q breakfast  . sodium chloride  3 mL Intravenous Q12H   Continuous Infusions: . dextrose 5 % and 0.9 % NaCl with KCl 20 mEq/L 70 mL/hr at 12/07/15 0600    Principal Problem:   Acute respiratory failure with hypercapnia (HCC) Active Problems:   Obesity   Chronic pain syndrome   Chronic combined systolic and diastolic congestive heart failure (HCC)   Chronic atrial  fibrillation (HCC)   COPD mixed type (HCC)   Bilateral pneumonia   Sepsis (HCC)   Hypothermia   Hyperglycemia, drug-induced   Morbid obesity (HCC)   Hypokalemia   Conjunctivitis    Time spent: 25 minutes. Greater than 50% of this time was spent in direct contact with the patient coordinating care.    Logan West  [facility] Pager [phone]  If 7PM-7AM, please contact night-coverage at [email or web address], password [facility] 12/07/2015, 10:08 AM  LOS: 9 days

## 2015-12-07 NOTE — Plan of Care (Signed)
[name] is resting quietly in bed.  He makes and keeps eye contact.  No family at bedside.  He tells me that his wife and daughter were murdered last night.  I reassure him.  Visit later in the day, again, no family at bedside and [name] tells me that they were murdered.   Call to wife, no answer, unable to leave VM.  Call to daughter [name], we plan to meet 1/17 at 1 pm at bedside with Mrs. Logan West.  [name] tells me that she has been caring for them the last 3 months, both have been in and out of the hospital.

## 2015-12-07 NOTE — Progress Notes (Signed)
Report called to J. Coffey, Charity fundraiserN. Patient transferred to 332 in stable condition via bed.

## 2015-12-07 NOTE — Progress Notes (Signed)
Subjective: I reevaluated him after not seeing him for the last 3 days. He was able to be extubated on the 13th and has tolerated this well. He has DO NOT RESUSCITATE status now. He says his breathing is good. He has no new complaints. He has been confused with some hallucinations  Objective: Vital signs in last 24 hours: Temp:  [96.8 F (36 C)-98.9 F (37.2 C)] 96.9 F (36.1 C) (01/16 0400) Pulse Rate:  [65-131] 97 (01/16 0600) Resp:  [17-30] 20 (01/16 0600) BP: (104-149)/(53-95) 118/95 mmHg (01/16 0600) SpO2:  [91 %-97 %] 96 % (01/16 0600) Weight:  [102.4 kg (225 lb 12 oz)] 102.4 kg (225 lb 12 oz) (01/16 0500) Weight change: -1.1 kg (-2 lb 6.8 oz) Last BM Date: 12/04/15  Intake/Output from previous day: 01/15 0701 - 01/16 0700 In: 1800 [P.O.:120; I.V.:1680] Out: 4000 [Urine:4000]  PHYSICAL EXAM General appearance: alert, mild distress, morbidly obese and Mildly confused Resp: diminished breath sounds bilaterally Cardio: irregularly irregular rhythm and With tachycardia to about 120 GI: soft, non-tender; bowel sounds normal; no masses,  no organomegaly Extremities: extremities normal, atraumatic, no cyanosis or edema  Lab Results:  Results for orders placed or performed during the hospital encounter of 11/28/15 (from the past 48 hour(s))  Glucose, capillary     Status: None   Collection Time: 12/05/15  8:05 AM  Result Value Ref Range   Glucose-Capillary 82 65 - 99 mg/dL   Comment 1 Notify RN    Comment 2 Document in Chart   Glucose, capillary     Status: Abnormal   Collection Time: 12/05/15 11:47 AM  Result Value Ref Range   Glucose-Capillary 102 (H) 65 - 99 mg/dL   Comment 1 Notify RN    Comment 2 Document in Chart   Glucose, capillary     Status: None   Collection Time: 12/05/15  4:50 PM  Result Value Ref Range   Glucose-Capillary 86 65 - 99 mg/dL   Comment 1 Notify RN    Comment 2 Document in Chart   Glucose, capillary     Status: Abnormal   Collection Time:  12/05/15  7:55 PM  Result Value Ref Range   Glucose-Capillary 117 (H) 65 - 99 mg/dL  Glucose, capillary     Status: None   Collection Time: 12/06/15 12:12 AM  Result Value Ref Range   Glucose-Capillary 80 65 - 99 mg/dL  Glucose, capillary     Status: Abnormal   Collection Time: 12/06/15  4:02 AM  Result Value Ref Range   Glucose-Capillary 121 (H) 65 - 99 mg/dL  Basic metabolic panel     Status: Abnormal   Collection Time: 12/06/15  4:46 AM  Result Value Ref Range   Sodium 146 (H) 135 - 145 mmol/L   Potassium 4.3 3.5 - 5.1 mmol/L   Chloride 105 101 - 111 mmol/L   CO2 30 22 - 32 mmol/L   Glucose, Bld 91 65 - 99 mg/dL   BUN 29 (H) 6 - 20 mg/dL   Creatinine, Ser 0.80 0.61 - 1.24 mg/dL   Calcium 9.1 8.9 - 10.3 mg/dL   GFR calc non Af Amer >60 >60 mL/min   GFR calc Af Amer >60 >60 mL/min    Comment: (NOTE) The eGFR has been calculated using the CKD EPI equation. This calculation has not been validated in all clinical situations. eGFR's persistently <60 mL/min signify possible Chronic Kidney Disease.    Anion gap 11 5 - 15  CBC  Status: None   Collection Time: 12/06/15  7:29 AM  Result Value Ref Range   WBC 8.0 4.0 - 10.5 K/uL   RBC 5.14 4.22 - 5.81 MIL/uL   Hemoglobin 15.5 13.0 - 17.0 g/dL   HCT 49.7 39.0 - 52.0 %   MCV 96.7 78.0 - 100.0 fL   MCH 30.2 26.0 - 34.0 pg   MCHC 31.2 30.0 - 36.0 g/dL   RDW 13.6 11.5 - 15.5 %   Platelets 178 150 - 400 K/uL  Glucose, capillary     Status: None   Collection Time: 12/06/15  7:57 AM  Result Value Ref Range   Glucose-Capillary 99 65 - 99 mg/dL   Comment 1 Notify RN    Comment 2 Document in Chart   Glucose, capillary     Status: None   Collection Time: 12/06/15 11:14 AM  Result Value Ref Range   Glucose-Capillary 98 65 - 99 mg/dL  Glucose, capillary     Status: Abnormal   Collection Time: 12/06/15  4:40 PM  Result Value Ref Range   Glucose-Capillary 109 (H) 65 - 99 mg/dL   Comment 1 Notify RN    Comment 2 Document in Chart    Glucose, capillary     Status: None   Collection Time: 12/06/15  7:39 PM  Result Value Ref Range   Glucose-Capillary 88 65 - 99 mg/dL  Glucose, capillary     Status: None   Collection Time: 12/06/15 11:51 PM  Result Value Ref Range   Glucose-Capillary 95 65 - 99 mg/dL  Glucose, capillary     Status: Abnormal   Collection Time: 12/07/15  3:50 AM  Result Value Ref Range   Glucose-Capillary 62 (L) 65 - 99 mg/dL  Glucose, capillary     Status: None   Collection Time: 12/07/15  4:18 AM  Result Value Ref Range   Glucose-Capillary 92 65 - 99 mg/dL  Basic metabolic panel     Status: Abnormal   Collection Time: 12/07/15  4:52 AM  Result Value Ref Range   Sodium 143 135 - 145 mmol/L   Potassium 3.5 3.5 - 5.1 mmol/L    Comment: DELTA CHECK NOTED   Chloride 101 101 - 111 mmol/L   CO2 34 (H) 22 - 32 mmol/L   Glucose, Bld 92 65 - 99 mg/dL   BUN 30 (H) 6 - 20 mg/dL   Creatinine, Ser 0.89 0.61 - 1.24 mg/dL   Calcium 9.2 8.9 - 10.3 mg/dL   GFR calc non Af Amer >60 >60 mL/min   GFR calc Af Amer >60 >60 mL/min    Comment: (NOTE) The eGFR has been calculated using the CKD EPI equation. This calculation has not been validated in all clinical situations. eGFR's persistently <60 mL/min signify possible Chronic Kidney Disease.    Anion gap 8 5 - 15  CBC     Status: None   Collection Time: 12/07/15  4:52 AM  Result Value Ref Range   WBC 8.4 4.0 - 10.5 K/uL   RBC 4.93 4.22 - 5.81 MIL/uL   Hemoglobin 14.7 13.0 - 17.0 g/dL   HCT 46.6 39.0 - 52.0 %   MCV 94.5 78.0 - 100.0 fL   MCH 29.8 26.0 - 34.0 pg   MCHC 31.5 30.0 - 36.0 g/dL   RDW 13.4 11.5 - 15.5 %   Platelets 174 150 - 400 K/uL    ABGS  Recent Labs  12/04/15 1555  PHART 7.357  PO2ART 76.8*  TCO2 19.1  HCO3 30.3*   CULTURES Recent Results (from the past 240 hour(s))  Blood culture (routine x 2)     Status: None   Collection Time: 11/28/15  9:25 PM  Result Value Ref Range Status   Specimen Description LEFT ANTECUBITAL   Final   Special Requests BOTTLES DRAWN AEROBIC ONLY 6CC ONLY  Final   Culture NO GROWTH 6 DAYS  Final   Report Status 12/04/2015 FINAL  Final  Blood culture (routine x 2)     Status: None   Collection Time: 11/28/15  9:25 PM  Result Value Ref Range Status   Specimen Description LEFT ANTECUBITAL  Final   Special Requests BOTTLES DRAWN AEROBIC ONLY 6CC ONLY  Final   Culture NO GROWTH 6 DAYS  Final   Report Status 12/04/2015 FINAL  Final  MRSA PCR Screening     Status: None   Collection Time: 11/29/15 12:46 AM  Result Value Ref Range Status   MRSA by PCR NEGATIVE NEGATIVE Final    Comment:        The GeneXpert MRSA Assay (FDA approved for NASAL specimens only), is one component of a comprehensive MRSA colonization surveillance program. It is not intended to diagnose MRSA infection nor to guide or monitor treatment for MRSA infections.   Culture, respiratory (NON-Expectorated)     Status: None   Collection Time: 11/29/15  3:18 PM  Result Value Ref Range Status   Specimen Description TRACHEAL ASPIRATE  Final   Special Requests NONE  Final   Gram Stain   Final    NO WBC SEEN NO SQUAMOUS EPITHELIAL CELLS SEEN NO ORGANISMS SEEN Performed at [facility]    Culture   Final    NORMAL OROPHARYNGEAL FLORA Performed at [facility]    Report Status 12/01/2015 FINAL  Final   Studies/Results: No results found.  Medications:  Prior to Admission:  Prescriptions prior to admission  Medication Sig Dispense Refill Last Dose  . albuterol (PROVENTIL HFA;VENTOLIN HFA) 108 (90 BASE) MCG/ACT inhaler Inhale 2 puffs into the lungs every 6 (six) hours as needed for wheezing or shortness of breath.   Past Month at Unknown time  . albuterol (PROVENTIL) (2.5 MG/3ML) 0.083% nebulizer solution Take 3 mLs (2.5 mg total) by nebulization every 6 (six) hours as needed for wheezing or shortness of breath. 75 mL 12 Past Month at Unknown time  . amLODipine (NORVASC) 10 MG tablet Take 10  mg by mouth daily.   Past Month at Unknown time  . aspirin 81 MG EC tablet Take 81 mg by mouth daily.  0 Past Month at Unknown time  . budesonide-formoterol (SYMBICORT) 160-4.5 MCG/ACT inhaler Inhale 2 puffs into the lungs 2 (two) times daily. 1 Inhaler 5 Past Month at Unknown time  . diltiazem (CARDIZEM CD) 360 MG 24 hr capsule Take 1 capsule (360 mg total) by mouth daily. 30 capsule 6 Past Month at Unknown time  . furosemide (LASIX) 20 MG tablet TAKE 1 TABLET (20 MG TOTAL) BY MOUTH DAILY. 30 tablet 11 Past Month at Unknown time  . guaiFENesin (MUCINEX) 600 MG 12 hr tablet Take 2 tablets (1,200 mg total) by mouth 2 (two) times daily. 20 tablet 1 Past Month at Unknown time  . LORazepam (ATIVAN) 1 MG tablet Take 1 tablet (1 mg total) by mouth every 8 (eight) hours as needed for anxiety. 60 tablet 5 Past Month at Unknown time  . metoprolol (LOPRESSOR) 50 MG tablet TAKE 1 TABLET (50 MG TOTAL) BY MOUTH  DAILY. 30 tablet 5 Past Month at Unknown time  . pravastatin (PRAVACHOL) 40 MG tablet TAKE 1 TABLET (40 MG TOTAL) BY MOUTH DAILY. 30 tablet 3 Past Month at Unknown time  . tamsulosin (FLOMAX) 0.4 MG CAPS capsule Take 1 capsule by mouth daily.   Past Month at Unknown time  . traMADol (ULTRAM) 50 MG tablet TAKE 1 TABLET BY MOUTH EVERY eight HOURS AS NEEDED FOR MODERATE PAIN 90 tablet 5 Past Month at Unknown time   Scheduled: . albuterol  2.5 mg Nebulization Q6H  . aspirin EC  81 mg Oral Daily  . enoxaparin (LOVENOX) injection  60 mg Subcutaneous Q24H  . famotidine  20 mg Per Tube Daily  . furosemide  40 mg Intravenous Q12H  . insulin aspart  0-15 Units Subcutaneous 6 times per day  . insulin glargine  7 Units Subcutaneous BID  . metoprolol tartrate  25 mg Oral BID  . predniSONE  30 mg Oral Q breakfast  . sodium chloride  3 mL Intravenous Q12H   Continuous: . dextrose 5 % and 0.9 % NaCl with KCl 20 mEq/L 70 mL/hr at 12/07/15 0600   SXQ:[id number], fentaNYL (SUBLIMAZE) injection,  LORazepam  Assesment: He was admitted with bilateral pneumonia sepsis and respiratory failure from that and COPD. He required intubation and mechanical ventilation but came off about 72 hours ago. He says his breathing is okay now. He does look comfortable. He is on nasal oxygen. Plans are for him likely to need SNF at discharge Principal Problem:   Acute respiratory failure with hypercapnia (HCC) Active Problems:   Obesity   Chronic pain syndrome   Chronic combined systolic and diastolic congestive heart failure (HCC)   Chronic atrial fibrillation (HCC)   COPD mixed type (HCC)   Bilateral pneumonia   Sepsis ([place])   Hypothermia   Hyperglycemia, drug-induced   Morbid obesity (HCC)   Hypokalemia   Conjunctivitis    Plan: Agree with continuing to taper steroids. He is much improved and I will plan to follow more peripherally. Thanks for allowing me to see him with you. If more active participation is needed please let me know    LOS: 9 days   Jaycee Mckellips L 12/07/2015, 7:21 AM

## 2015-12-07 NOTE — Progress Notes (Signed)
Inpatient Diabetes Program Recommendations  AACE/ADA: New Consensus Statement on Inpatient Glycemic Control (2015)  Target Ranges:  Prepandial:   less than 140 mg/dL      Peak postprandial:   less than 180 mg/dL (1-2 hours)      Critically ill patients:  140 - 180 mg/dL   Review of Glycemic Control  Inpatient Diabetes Program Recommendations:    Patient has been having some hypoglycemia. Noted lantus discontinued.  May want to change correction to the sensitive tidwc as well.  Thank you Lenor CoffinAnn Royelle Hinchman, RN, MSN, CDE  Diabetes Inpatient Program Office: [phone] Pager: [phone] 8:00 am to 5:00 pm

## 2015-12-08 DIAGNOSIS — I5042 Chronic combined systolic (congestive) and diastolic (congestive) heart failure: Secondary | ICD-10-CM | POA: Diagnosis not present

## 2015-12-08 DIAGNOSIS — Z515 Encounter for palliative care: Secondary | ICD-10-CM | POA: Diagnosis not present

## 2015-12-08 DIAGNOSIS — J9602 Acute respiratory failure with hypercapnia: Secondary | ICD-10-CM | POA: Diagnosis not present

## 2015-12-08 DIAGNOSIS — E785 Hyperlipidemia, unspecified: Secondary | ICD-10-CM | POA: Diagnosis not present

## 2015-12-08 DIAGNOSIS — Z7189 Other specified counseling: Secondary | ICD-10-CM

## 2015-12-08 DIAGNOSIS — M6281 Muscle weakness (generalized): Secondary | ICD-10-CM | POA: Diagnosis not present

## 2015-12-08 DIAGNOSIS — Z9981 Dependence on supplemental oxygen: Secondary | ICD-10-CM | POA: Diagnosis not present

## 2015-12-08 DIAGNOSIS — I482 Chronic atrial fibrillation: Secondary | ICD-10-CM | POA: Diagnosis not present

## 2015-12-08 DIAGNOSIS — J449 Chronic obstructive pulmonary disease, unspecified: Secondary | ICD-10-CM | POA: Diagnosis not present

## 2015-12-08 DIAGNOSIS — J189 Pneumonia, unspecified organism: Secondary | ICD-10-CM | POA: Diagnosis not present

## 2015-12-08 DIAGNOSIS — Z7982 Long term (current) use of aspirin: Secondary | ICD-10-CM | POA: Diagnosis not present

## 2015-12-08 DIAGNOSIS — I110 Hypertensive heart disease with heart failure: Secondary | ICD-10-CM | POA: Diagnosis not present

## 2015-12-08 LAB — GLUCOSE, CAPILLARY
GLUCOSE-CAPILLARY: 126 mg/dL — AB (ref 65–99)
GLUCOSE-CAPILLARY: 90 mg/dL (ref 65–99)
GLUCOSE-CAPILLARY: 91 mg/dL (ref 65–99)
GLUCOSE-CAPILLARY: 96 mg/dL (ref 65–99)

## 2015-12-08 NOTE — NC FL2 (Signed)
[place] MEDICAID FL2 LEVEL OF CARE SCREENING TOOL     IDENTIFICATION  Patient Name: Logan West Birthdate: Mar 21, 1934 Sex: male Admission Date (Current Location): 11/28/2015  [facility] and [place] Number:  [place] and Address:  [facility],  [address] [place] [address]      Provider Number: [id number]  Attending Physician Name and Address:  Micael Hampshire Acost*  Relative Name and Phone Number:       Current Level of Care: Hospital Recommended Level of Care: Skilled Nursing Facility Prior Approval Number:    Date Approved/Denied:   PASRR Number:  [id number])  Discharge Plan: SNF    Current Diagnoses: Patient Active Problem List   Diagnosis Date Noted  . Palliative care encounter   . Conjunctivitis 12/02/2015  . Hypokalemia 11/30/2015  . Bilateral pneumonia 11/29/2015  . Sepsis (HCC) 11/29/2015  . Hypothermia 11/29/2015  . Hyperglycemia, drug-induced 11/29/2015  . Morbid obesity (HCC) 11/29/2015  . Respiratory failure (HCC) 11/28/2015  . Acute respiratory failure with hypercapnia (HCC) 11/28/2015  . COPD mixed type (HCC) 09/21/2015  . Hematuria 09/01/2015  . Chronic atrial fibrillation (HCC) 08/29/2015  . Chronic combined systolic and diastolic congestive heart failure (HCC) 08/01/2015  . Chronic pain syndrome 05/28/2013  . HLD (hyperlipidemia) 05/28/2013  . Essential hypertension 03/16/2011  . Anxiety 03/16/2011  . Obesity 03/16/2011    Orientation RESPIRATION BLADDER Height & Weight    Self, Time, Situation, Place  O2 (2L) Continent 6' (182.9 cm) 221 lbs.  BEHAVIORAL SYMPTOMS/MOOD NEUROLOGICAL BOWEL NUTRITION STATUS      Continent Diet (DIET SOFT)  AMBULATORY STATUS COMMUNICATION OF NEEDS Skin   Limited Assist Verbally Normal                       Personal Care Assistance Level of Assistance  Dressing     Dressing Assistance: Limited assistance     Functional Limitations Info  Sight, Hearing, Speech  Sight Info: Adequate Hearing Info: Adequate Speech Info: Adequate    SPECIAL CARE FACTORS FREQUENCY  PT (By licensed PT)     PT Frequency: 5x/week              Contractures      Additional Factors Info  Code Status, Psychotropic, Insulin Sliding Scale Code Status Info:  (DNR)   Psychotropic Info:  (Ativan) Insulin Sliding Scale Info:  (6x/day)       Current Medications (12/08/2015):  This is the current hospital active medication list Current Facility-Administered Medications  Medication Dose Route Frequency Provider Last Rate Last Dose  . albuterol (PROVENTIL) (2.5 MG/3ML) 0.083% nebulizer solution 2.5 mg  2.5 mg Nebulization Q2H PRN Houston Siren, MD      . aspirin EC tablet 81 mg  81 mg Oral Daily Houston Siren, MD   81 mg at 12/08/15 0936  . dextrose 5 % and 0.9 % NaCl with KCl 20 mEq/L infusion   Intravenous Continuous Elliot Cousin, MD 70 mL/hr at 12/08/15 0442    . enoxaparin (LOVENOX) injection 60 mg  60 mg Subcutaneous Q24H Elliot Cousin, MD   60 mg at 12/07/15 1306  . famotidine (PEPCID) tablet 20 mg  20 mg Per Tube Daily Elliot Cousin, MD   20 mg at 12/08/15 0936  . fentaNYL (SUBLIMAZE) injection 12.5 mcg  12.5 mcg Intravenous Q2H PRN Kari Baars, MD      . furosemide (LASIX) tablet 40 mg  40 mg Oral Daily Estela Isaiah Blakes, MD  40 mg at 12/08/15 0936  . insulin aspart (novoLOG) injection 0-15 Units  0-15 Units Subcutaneous 6 times per day Houston Siren, MD   2 Units at 12/07/15 1723  . LORazepam (ATIVAN) injection 1 mg  1 mg Intravenous Q4H PRN Kari Baars, MD   1 mg at 12/05/15 1614  . metoprolol tartrate (LOPRESSOR) tablet 25 mg  25 mg Oral BID Henderson Cloud, MD   25 mg at 12/08/15 0936  . predniSONE (DELTASONE) tablet 20 mg  20 mg Oral Q breakfast Henderson Cloud, MD   20 mg at 12/08/15 0936  . sodium chloride 0.9 % injection 3 mL  3 mL Intravenous Q12H Houston Siren, MD   3 mL at 12/07/15 2205     Discharge Medications: Please see  discharge summary for a list of discharge medications.  Relevant Imaging Results:  Relevant Lab Results:   Additional Information    Vansh Reckart, Juleen China, LCSW

## 2015-12-08 NOTE — Clinical Social Work Placement (Signed)
   CLINICAL SOCIAL WORK PLACEMENT  NOTE  Date:  12/08/2015  Patient Details  Name: Logan West MRN: [id number] Date of Birth: 05-10-1934  Clinical Social Work is seeking post-discharge placement for this patient at the Skilled  Nursing Facility level of care (*CSW will initial, date and re-position this form in  chart as items are completed):  Yes   Patient/family provided with [facility] Clinical Social Work Department's list of facilities offering this level of care within the geographic area requested by the patient (or if unable, by the patient's family).  Yes   Patient/family informed of their freedom to choose among providers that offer the needed level of care, that participate in Medicare, Medicaid or managed care program needed by the patient, have an available bed and are willing to accept the patient.  Yes   Patient/family informed of [facility]'s ownership interest in [facility] and [facility], as well as of the fact that they are under no obligation to receive care at these facilities.  PASRR submitted to EDS on 12/08/15     PASRR number received on 12/08/15     Existing PASRR number confirmed on       FL2 transmitted to all facilities in geographic area requested by pt/family on       FL2 transmitted to all facilities within larger geographic area on 12/08/15     Patient informed that his/her managed care company has contracts with or will negotiate with certain facilities, including the following:        Yes   Patient/family informed of bed offers received.  Patient chooses bed at [facility]     Physician recommends and patient chooses bed at      Patient to be transferred to [facility] on 12/08/15.  Patient to be transferred to facility by  (Patient's daughters have agreed to transport patient. )     Patient family notified on 12/08/15 of transfer.  Name of family member notified:  Spouse and two daughters were at bedside and were  notified.  (Spouse and two daughters were at bedside and were notified.)     PHYSICIAN       Additional Comment:    _______________________________________________ Annice Needy, LCSW 12/08/2015, 1:47 PM [phone]

## 2015-12-08 NOTE — Evaluation (Signed)
Physical Therapy Evaluation Patient Details Name: Logan West MRN: [id number] DOB: 1934/10/22 Today's Date: 12/08/2015   History of Present Illness  81yo [facility]. Pt was extubated on 1/13. PMH: afib, COPD, PNA. At evaluation pt is on 2L throughout with vitals stable.   Clinical Impression  Pt is received semirecumbent in bed upon entry, awake, alert, and willing to participate. No acute distress noted.Pt previously noted to be AMS c hallucination, however at evaluation, pt is A&Ox3 and pleasant, with logic thought processes. Pt reports zero falls in the last 6 months. Pt strength as screened by functional mobility assessment revealing moderate global weakness, requiring assistance for all mobility, whereas he typically walks limited community distances without AD. O2 sats remain stable throughout session on 2L which is baseline. Pt able to tolerate prolonged trunk strength and dynamic balance in session for toiletting and cleanup with ModA to perform.Patient presenting with impairment of strength, balance, oxygen perfusion, and activity tolerance, limiting ability to perform ADL and mobility tasks at  baseline level of function. Patient will benefit from skilled intervention to address the above impairments and limitations, in order to restore to prior level of function, improve patient safety upon discharge, and to decrease falls risk. Recommending DC to STR/SNF once medically ready to regain functional indep and strength for eventual return to home.        Follow Up Recommendations SNF;Supervision for mobility/OOB;Supervision - Intermittent    Equipment Recommendations  Rolling walker with 5" wheels    Recommendations for Other Services       Precautions / Restrictions Precautions Precautions: None Restrictions Weight Bearing Restrictions: No      Mobility  Bed Mobility Overal bed mobility: Needs Assistance Bed Mobility: Supine to Sit      Supine to sit: Supervision        Transfers Overall transfer level: Needs assistance Equipment used: Rolling walker (2 wheeled) Transfers: Sit to/from Stand Sit to Stand: Mod assist         General transfer comment: 1x from EOB, 1x from [facility]   Ambulation/Gait Ambulation/Gait assistance: Min guard Ambulation Distance (Feet): 20 Feet Assistive device: Rolling walker (2 wheeled)   Gait velocity: slow, steady Gait velocity interpretation: <1.8 ft/sec, indicative of risk for recurrent falls    Stairs            Wheelchair Mobility    Modified Rankin (Stroke Patients Only)       Balance Overall balance assessment: No apparent balance deficits (not formally assessed)                                           Pertinent Vitals/Pain Pain Assessment:  (Reports nothing new or exacerbated from baseline. )    Home Living Family/patient expects to be discharged to:: Private residence Living Arrangements: Spouse/significant other Available Help at Discharge: Family (daughters in [place] and [place]. ) Type of Home: Mobile home Home Access: Stairs to enter Entrance Stairs-Rails: Lawyer of Steps: 8 Home Layout: One level Home Equipment: None      Prior Function Level of Independence: Independent         Comments: Limited community distance AMB on 2L.      Hand Dominance   Dominant Hand: Right    Extremity/Trunk Assessment   Upper Extremity Assessment: Generalized weakness  Lower Extremity Assessment: Generalized weakness         Communication   Communication: No difficulties  Cognition Arousal/Alertness: Awake/alert Behavior During Therapy: WFL for tasks assessed/performed Overall Cognitive Status: Within Functional Limits for tasks assessed                      General Comments      Exercises        Assessment/Plan    PT Assessment Patient needs continued PT services   PT Diagnosis Difficulty walking;Generalized weakness;Abnormality of gait   PT Problem List Decreased strength;Decreased knowledge of use of DME;Decreased activity tolerance;Decreased balance;Decreased coordination;Decreased mobility;Cardiopulmonary status limiting activity  PT Treatment Interventions DME instruction;Gait training;Stair training;Functional mobility training;Therapeutic activities;Therapeutic exercise;Balance training;Patient/family education   PT Goals (Current goals can be found in the Care Plan section) Acute Rehab PT Goals Patient Stated Goal: Regain strength prior to return to home.  PT Goal Formulation: With patient Time For Goal Achievement: 12/22/15 Potential to Achieve Goals: Fair    Frequency Min 3X/week   Barriers to discharge Inaccessible home environment;Decreased caregiver support Stairs to enter exit, limted ambulation capability at eval, daughters out of town.     Co-evaluation               End of Session Equipment Utilized During Treatment: Gait belt Activity Tolerance: Patient tolerated treatment well;Patient limited by fatigue;Patient limited by lethargy Patient left: in chair;with call bell/phone within reach;with chair alarm set Nurse Communication: Mobility status;Other (comment) (BM during session. )         Time: 1610-9604 PT Time Calculation (min) (ACUTE ONLY): 26 min   Charges:   PT Evaluation $PT Eval Moderate Complexity: 1 Procedure PT Treatments $Therapeutic Activity: 8-22 mins   PT G Codes:        [name] C 2015/12/17, 1:07 PM  1:11 PM  Rosamaria Lints, PT, DPT [place] License # [id number]

## 2015-12-08 NOTE — Clinical Social Work Note (Signed)
Clinical Social Work Assessment  Patient Details  Name: Logan West MRN: [id number] Date of Birth: 03/25/1934  Date of referral:  12/08/15               Reason for consult:  Facility Placement                Permission sought to share information with:    Permission granted to share information::     Name::        Agency::     Relationship::     Contact Information:     Housing/Transportation Living arrangements for the past 2 months:  Single Family Home Source of Information:  Patient Patient Interpreter Needed:  None Criminal Activity/Legal Involvement Pertinent to Current Situation/Hospitalization:  No - Comment as needed Significant Relationships:  Spouse Lives with:  Spouse Do you feel safe going back to the place where you live?  Yes Need for family participation in patient care:  Yes (Comment)  Care giving concerns: None identified at baseline.    Social Worker assessment / plan: Patient identified that he lives in [place] with his wife.  He stated that at baseline he ambulates unassisted and completes ADLs unassisted.  CSW spoke with patient about PT recommendation for SNF. Patient was agreeable. He stated that he preferred [facility] or [address] as they were both closer to his home.  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  Patient stated that he was willing to go to rehab short term.   Employment status:  Retired Database administrator PT Recommendations:  Skilled Nursing Facility Information / Referral to community resources:  Skilled Nursing Facility  Patient/Family's Response to care:  Patient is agreeable to go to SNF.  Patient/Family's Understanding of and Emotional Response to Diagnosis, Current Treatment, and Prognosis: Patient understands his diagnosis, treatment and prognosis.   Emotional Assessment Appearance:  Appears stated  age Attitude/Demeanor/Rapport:   (Cooperative) Affect (typically observed):  Accepting, Calm Orientation:  Oriented to Self, Oriented to Place, Oriented to  Time, Oriented to Situation Alcohol / Substance use:  Not Applicable Psych involvement (Current and /or in the community):  No (Comment)  Discharge Needs  Concerns to be addressed:  No discharge needs identified Readmission within the last 30 days:  No Current discharge risk:  None Barriers to Discharge:  No Barriers Identified   Annice Needy, LCSW 12/08/2015, 11:26 AM

## 2015-12-08 NOTE — Discharge Summary (Signed)
Physician Discharge Summary  Logan West ZOX:[id number] DOB: [birth year] DOA: 11/28/2015  PCP: Mechele Claude, MD  Admit date: 11/28/2015 Discharge date: 12/08/2015  Time spent: 45 minutes  Recommendations for Outpatient Follow-up:  -Will be discharged to SNF today hopefully for short-term skilled nursing purposes.   Discharge Diagnoses:  Principal Problem:   Acute respiratory failure with hypercapnia (HCC) Active Problems:   Obesity   Chronic pain syndrome   Chronic combined systolic and diastolic congestive heart failure (HCC)   Chronic atrial fibrillation (HCC)   COPD mixed type (HCC)   Bilateral pneumonia   Sepsis (HCC)   Hypothermia   Hyperglycemia, drug-induced   Morbid obesity (HCC)   Hypokalemia   Conjunctivitis   Palliative care encounter   Discharge Condition: Stable and improved  Filed Weights   12/06/15 0500 12/07/15 0500 12/08/15 0814  Weight: 103.5 kg (228 lb 2.8 oz) 102.4 kg (225 lb 12 oz) 100.336 kg (221 lb 3.2 oz)    History of present illness:  As per Dr. Conley Rolls on 1/7: Logan West is an 82 y.o. male with hx of COPD, HLD, CAD, combined CHF (Dr Octaviano Batty), obesity, chronic afib who declined anticoagulation, hx of hematuria (cleared by urology for anticoagualtion if needed), presented to the ER via EMS in respiratory extremis, receiving nebs and Bipap en route, intubated immediately upon arrival to the ER. In the ER, CXR showed possible Multifocal PNA vs pul edema, lactic acid normal, hypotensive after intubation and profopol, ABG showed 7.20/90/ pOx= 262 on 100%. With serology showed normal renal Fx test, no leuckocytosis, and normal Hb. He was given IV Solumedrol, and Van/Zosyn, and hospitalist was asked to admit him for acute on chronic hypercapneic respiratory failure with mulitifocal PNA and hypotension.   Hospital Course:   Acute, Ventilatory Dependant Respiratory Failure with Hypercapnea -2/2 HCAP and COPD. -Successfully extubated 1/13. -Patient  and family have now decided on DO NOT RESUSCITATE. -Has completed steroid course. -PT has assess and is recommending SNF.  HCAP -Completed treatment.  COPD -No wheezes on exam. -Continue steroid titration.  Acute on Chronic Combined CHF -ECHO 9/16: EF 45-50% with diffuse hypokinesis and high ventricular filling pressures. -Volume status has improved. -8.8 L negative since admission. -Continue Lasix 20 mg daily.  A Fib -Chronic. -Per charts, has refused anticoagulation in past. -Rate controlled at present. -Consider resuming Cardizem in near future if blood pressure tolerates for continued rate control.  Hyperglycemia -Steroid-Induced. -Well controlled. -DC lantus.  Right Conjunctivitis -With purulent discharge, likely bacterial. -Resolved, completed treatment with topical erythromycin.  Procedures:  None   Consultations:  Pulmonary  Discharge Instructions  Discharge Instructions    Diet - low sodium heart healthy    Complete by:  As directed      Increase activity slowly    Complete by:  As directed             Medication List    STOP taking these medications        diltiazem 360 MG 24 hr capsule  Commonly known as:  CARDIZEM CD     traMADol 50 MG tablet  Commonly known as:  ULTRAM      TAKE these medications        albuterol 108 (90 Base) MCG/ACT inhaler  Commonly known as:  PROVENTIL HFA;VENTOLIN HFA  Inhale 2 puffs into the lungs every 6 (six) hours as needed for wheezing or shortness of breath.     amLODipine 10 MG tablet  Commonly known as:  NORVASC  Take 10 mg by mouth daily.     aspirin 81 MG EC tablet  Take 81 mg by mouth daily.     budesonide-formoterol 160-4.5 MCG/ACT inhaler  Commonly known as:  SYMBICORT  Inhale 2 puffs into the lungs 2 (two) times daily.     furosemide 20 MG tablet  Commonly known as:  LASIX  TAKE 1 TABLET (20 MG TOTAL) BY MOUTH DAILY.     guaiFENesin 600 MG 12 hr tablet  Commonly known as:  MUCINEX    Take 2 tablets (1,200 mg total) by mouth 2 (two) times daily.     LORazepam 1 MG tablet  Commonly known as:  ATIVAN  Take 1 tablet (1 mg total) by mouth every 8 (eight) hours as needed for anxiety.     metoprolol 50 MG tablet  Commonly known as:  LOPRESSOR  TAKE 1 TABLET (50 MG TOTAL) BY MOUTH DAILY.     pravastatin 40 MG tablet  Commonly known as:  PRAVACHOL  TAKE 1 TABLET (40 MG TOTAL) BY MOUTH DAILY.     tamsulosin 0.4 MG Caps capsule  Commonly known as:  FLOMAX  Take 1 capsule by mouth daily.       No Known Allergies    The results of significant diagnostics from this hospitalization (including imaging, microbiology, ancillary and laboratory) are listed below for reference.    Significant Diagnostic Studies: Dg Chest Port 1 View  12/03/2015  CLINICAL DATA:  Respiratory failure. EXAM: PORTABLE CHEST 1 VIEW COMPARISON:  One day prior FINDINGS: Endotracheal tube terminates 5.8 cm above carina.Mildly degraded exam due to AP portable technique and patient body habitus. Nasogastric poorly visualized distally. The upper lungs are overpenetrated. Small bilateral pleural effusions persist. Limited evaluation for pneumothorax. Cardiomegaly. Similar bibasilar airspace disease. IMPRESSION: Decreased sensitivity and specificity exam due to technique related factors, as described above. Similar small bilateral pleural effusions with bibasilar airspace opacities. Cardiomegaly without congestive failure. Electronically Signed   By: Jeronimo Greaves M.D.   On: 12/03/2015 09:11   Dg Chest Port 1 View  12/02/2015  CLINICAL DATA:  Orogastric tube placement EXAM: PORTABLE CHEST 1 VIEW COMPARISON:  Portable exam 1443 hours compared to 12/01/2015 FINDINGS: Exam centered low to include upper abdomen for OG tube placement. Tip of orogastric tube is difficult to visualize but appears project over the mid to distal stomach. If stable heart size and mediastinal contours. Tip of endotracheal tube not visualized  by this exam. Bibasilar effusions and atelectasis. Atherosclerotic calcification aorta. Visualized bowel gas pattern unremarkable. IMPRESSION: Tip of orogastric tube appears to project over the mid to distal stomach. Electronically Signed   By: Ulyses Southward M.D.   On: 12/02/2015 15:13   Dg Chest Port 1 View  12/01/2015  CLINICAL DATA:  82 year old male currently ventilated EXAM: PORTABLE CHEST 1 VIEW COMPARISON:  Prior chest x-ray 11/30/2015 FINDINGS: The endotracheal tube is 6.2 cm above the carina. A nasogastric tube is present, the tip is not imaged but lies below the diaphragm likely within the stomach. Stable cardiac and mediastinal contours. Atherosclerotic calcifications again noted in the transverse aorta. Improved aeration with decreased bibasilar opacities likely reflecting decreasing atelectasis. There are persistent bilateral moderate layering pleural effusions. No pulmonary edema. No pneumothorax. No acute osseous abnormality. IMPRESSION: 1. Improved aeration in both lung bases likely secondary to decreasing atelectasis. 2. Persistent bilateral moderate layering pleural effusions. 3. Stable and satisfactory support apparatus. Electronically Signed   By: Malachy Moan M.D.   On: 12/01/2015 07:47  Dg Chest Port 1 View  11/30/2015  CLINICAL DATA:  Respiratory failure EXAM: PORTABLE CHEST 1 VIEW COMPARISON:  Yesterday FINDINGS: Endotracheal tube tip is between the clavicular heads and carina. Gastric suction tube is not visible below the thoracic inlet due to underpenetration. Increasing hazy basilar opacities, likely atelectasis and pleural fluid. Apparent increase could be from dependent flow. There could be underlying airspace opacity. No pulmonary edema seen in the apical lungs. Normal heart size and stable mediastinal contours. IMPRESSION: 1. Hazy basilar opacities favoring atelectasis and layering pleural fluid. 2. Endotracheal tube remains in good position. Most of the orogastric tube is not  visible due to technical factors. Electronically Signed   By: Marnee Spring M.D.   On: 11/30/2015 09:08   Dg Chest Port 1 View  11/29/2015  CLINICAL DATA:  Respiratory failure EXAM: PORTABLE CHEST 1 VIEW COMPARISON:  Chest x-rays dated 11/28/2015 and 08/27/2015. FINDINGS: Endotracheal tube remains well positioned with tip just above the level of the carina. Enteric tube passes below the diaphragm. Mild cardiomegaly is unchanged. Overall cardiomediastinal silhouette is stable in size and configuration. Central pulmonary vascular congestion persists with mild bilateral interstitial edema, unchanged compared to yesterday's exam, significantly worsened compared to exam of 09/08/2015. Some component of the perihilar opacities likely related to chronic underlying pulmonary artery hypertension. Suspect additional mild bibasilar atelectasis and/or small pleural effusions. No new lung abnormality. IMPRESSION: 1. Overall stable appearance compared to chest x-ray of 11/28/2015. 2. Persistent central pulmonary vascular congestion and mild bilateral interstitial edema, not significantly changed in the short-term interval, suggesting mild volume overload/CHF. Patchy bibasilar airspace opacities are also unchanged, compatible with either atelectasis or pneumonia, favor atelectasis. Also suspect small bilateral pleural effusions. 3. Some component of the perihilar opacities likely related to chronic pulmonary artery enlargement caused by underlying pulmonary artery hypertension. 4. Endotracheal tube remains well positioned with tip just above the level of the carina. Electronically Signed   By: Bary Richard M.D.   On: 11/29/2015 11:07   Dg Chest Portable 1 View  11/28/2015  CLINICAL DATA:  Respiratory distress status post intubation. EXAM: PORTABLE CHEST 1 VIEW COMPARISON:  09/08/2015 FINDINGS: Endotracheal tube overlies the tracheal air column and terminates less than 1 cm superior to the carina. The cardiac silhouette is  normal. Mediastinal contours appear intact considering portable technique. There is no evidence of pleural effusion or pneumothorax. There are bilateral lower lobe predominant patchy airspace opacities Osseous structures are without acute abnormality. Soft tissues are grossly normal. IMPRESSION: Status post intubation with endotracheal tube terminating less than a cm from the carina. Bilateral patchy airspace consolidation with lower lobe predominance. These may represent developing pulmonary edema or multifocal pneumonia. Electronically Signed   By: Ted Mcalpine M.D.   On: 11/28/2015 21:43    Microbiology: Recent Results (from the past 240 hour(s))  Blood culture (routine x 2)     Status: None   Collection Time: 11/28/15  9:25 PM  Result Value Ref Range Status   Specimen Description LEFT ANTECUBITAL  Final   Special Requests BOTTLES DRAWN AEROBIC ONLY 6CC ONLY  Final   Culture NO GROWTH 6 DAYS  Final   Report Status 12/04/2015 FINAL  Final  Blood culture (routine x 2)     Status: None   Collection Time: 11/28/15  9:25 PM  Result Value Ref Range Status   Specimen Description LEFT ANTECUBITAL  Final   Special Requests BOTTLES DRAWN AEROBIC ONLY 6CC ONLY  Final   Culture NO GROWTH 6 DAYS  Final   Report Status 12/04/2015 FINAL  Final  MRSA PCR Screening     Status: None   Collection Time: 11/29/15 12:46 AM  Result Value Ref Range Status   MRSA by PCR NEGATIVE NEGATIVE Final    Comment:        The GeneXpert MRSA Assay (FDA approved for NASAL specimens only), is one component of a comprehensive MRSA colonization surveillance program. It is not intended to diagnose MRSA infection nor to guide or monitor treatment for MRSA infections.   Culture, respiratory (NON-Expectorated)     Status: None   Collection Time: 11/29/15  3:18 PM  Result Value Ref Range Status   Specimen Description TRACHEAL ASPIRATE  Final   Special Requests NONE  Final   Gram Stain   Final    NO WBC SEEN NO  SQUAMOUS EPITHELIAL CELLS SEEN NO ORGANISMS SEEN Performed at [facility]    Culture   Final    NORMAL OROPHARYNGEAL FLORA Performed at [facility]    Report Status 12/01/2015 FINAL  Final     Labs: Basic Metabolic Panel:  Recent Labs Lab 12/02/15 0438 12/03/15 0437 12/04/15 0444 12/06/15 0446 12/07/15 0452  NA 144 144 147* 146* 143  K 4.5 4.2 3.5 4.3 3.5  CL 109 109 108 105 101  CO2 28 30 32 30 34*  GLUCOSE 138* 122* 90 91 92  BUN 35* 39* 42* 29* 30*  CREATININE 1.01 0.92 0.91 0.80 0.89  CALCIUM 8.9 8.7* 8.8* 9.1 9.2   Liver Function Tests: No results for input(s): AST, ALT, ALKPHOS, BILITOT, PROT, ALBUMIN in the last 168 hours. No results for input(s): LIPASE, AMYLASE in the last 168 hours. No results for input(s): AMMONIA in the last 168 hours. CBC:  Recent Labs Lab 12/02/15 0438 12/03/15 0437 12/04/15 0444 12/06/15 0729 12/07/15 0452  WBC 5.4 5.2 5.8 8.0 8.4  HGB 12.9* 13.4 13.9 15.5 14.7  HCT 40.9 43.7 44.5 49.7 46.6  MCV 96.5 96.0 96.1 96.7 94.5  PLT 151 163 154 178 174   Cardiac Enzymes: No results for input(s): CKTOTAL, CKMB, CKMBINDEX, TROPONINI in the last 168 hours. BNP: BNP (last 3 results)  Recent Labs  08/27/15 2301 11/04/15 1410 11/28/15 2125  BNP 160.6* 126.5* 128.0*    ProBNP (last 3 results) No results for input(s): PROBNP in the last 8760 hours.  CBG:  Recent Labs Lab 12/07/15 2024 12/08/15 0026 12/08/15 0406 12/08/15 0743 12/08/15 1133  GLUCAP 83 91 96 90 126*       Signed:  HERNANDEZ ACOSTA,ESTELA  [facility] Pager: [phone] 12/08/2015, 12:03 PM

## 2015-12-08 NOTE — Progress Notes (Signed)
Nutrition Brief Note  Wt Readings from Last 15 Encounters:  12/08/15 221 lb 3.2 oz (100.336 kg)  11/04/15 239 lb (108.41 kg)  09/23/15 242 lb (109.77 kg)  09/21/15 241 lb 3.2 oz (109.408 kg)  09/09/15 239 lb (108.41 kg)  09/08/15 243 lb (110.224 kg)  09/01/15 247 lb 6.4 oz (112.22 kg)  08/17/15 245 lb 3.2 oz (111.222 kg)  08/03/15 240 lb 4.8 oz (109 kg)  03/20/15 254 lb (115.214 kg)  10/02/14 254 lb 9.6 oz (115.486 kg)  03/31/14 252 lb 12.8 oz (114.669 kg)  10/01/13 263 lb 9.6 oz (119.568 kg)  05/28/13 265 lb (120.203 kg)  02/19/13 261 lb 12.8 oz (118.752 kg)    Body mass index is 29.99 kg/(m^2). Patient meets criteria for overweight based on current BMI.   Following up with patient due to Length of say of 10 days and also to assess PO intake post-extubation. Pt says it took a little bit for his appetite to return after his extubation because he was "still hallucinating". He says this has improved and now he is "putting away food". He denies any trouble eating. Pt to d/c today to SNF.   Current diet order is Soft,  patient is consuming approximately 75% of meals at this time. Labs and medications reviewed.   No nutrition interventions warranted at this time. If nutrition issues arise, please consult RD.   Christophe Louis RD, LDN Nutrition Pager: [phone] 12/08/2015 12:48 PM

## 2015-12-08 NOTE — Consult Note (Signed)
Consultation Note Date: 12/08/2015   Patient Name: Logan West  DOB: 1934-04-12  MRN: [id number]  Age / Sex: 82 y.o., male  PCP: Mechele Claude, MD Referring Physician: Micael Hampshire West*  Reason for Consultation: Disposition, Establishing goals of care and Psychosocial/spiritual support  Clinical Assessment/Narrative: [name] is sitting up in the chair with his wife, and daughters [name] and [name] at his side.  We talk about his current health concerns, and disposition to rehab for a few weeks.  [name] asks, "Why is he so weak?"  We talk about his intubation and the deconditioning that comes with being bed bound for over a week.   We talk about his goal to return home as soon as he is able.  We discuss the use of HH vs Hospice services when he is able to dc to home.  Daughter, [name] states she has worked with hospice patients and feels that this is a good plan for him. I encourage family to work with the SW at [facility] ( which is 5 minutes from their home.) We discuss HCPOA and AD.  [name] mentions that he has had PNE 3 times recently.  We talk about the realities of CPR and [name] and family elects to allow a natural death.   Contacts/Participants in Discussion: Present today are [name], his wife, and 2 daughters [name] and [name].  Primary Decision Maker: [name].  He shares that if he can not make health care decisions, he wants his wife to decide.  She states, "i cant".  He then defers to [employer].    Relationship to Patient daughters HCPOA: no   SUMMARY OF RECOMMENDATIONS  Code Status/Advance Care Planning: DNR- [place]rod completed.  We discuss the concept of intubation, chest compression, and defibrillation.  [name] states, at first, that he would like to try intubation, if he can "come back to her" (pointing to his wife). We talk about the realities of intubation, and the chronic  illness trajectory.  He states he would prefer a natural death.  Daughters [name] and [name] are in agreement.     Code Status Orders        Start     Ordered   12/04/15 1818  Do not attempt resuscitation (DNR)   Continuous    Question Answer Comment  In the event of cardiac or respiratory ARREST Do not call a "code blue"   In the event of cardiac or respiratory ARREST Do not perform Intubation, CPR, defibrillation or ACLS   In the event of cardiac or respiratory ARREST Use medication by any route, position, wound care, and other measures to relive pain and suffering. May use oxygen, suction and manual treatment of airway obstruction as needed for comfort.      12/04/15 1817    Code Status History    Date Active Date Inactive Code Status Order ID Comments User Context   11/29/2015 12:52 AM 12/04/2015  6:17 PM Full Code [id number]  Houston Siren, MD Inpatient   08/27/2015  9:38 PM 09/01/2015  5:53 PM Full Code [id number]  Yevonne Pax, MD Inpatient   08/01/2015  5:12 PM 08/04/2015  3:52 PM Full Code [id number]  Ozella Rocks, MD ED      Other Directives:None  Symptom Management:   Per Hospitalist.   Palliative Prophylaxis:   Bowel Regimen, Oral Care and Turn Reposition  Additional Recommendations (Limitations, Scope, Preferences):  None at this time.   Psycho-social/Spiritual:  Support System: Strong,  lives with wife of 38 years, daughters nearby to help.  Desire for further Chaplaincy support:Not discussed today.  Additional Recommendations: Education on Hospice  Prognosis: Unable to determine, based on outcomes.   Discharge Planning: Skilled Nursing Facility for rehab with Palliative care service follow-up. Home with hospice.    Chief Complaint/ Primary Diagnoses: Present on Admission:  . Obesity . Chronic atrial fibrillation (HCC) . Chronic pain syndrome . Chronic combined systolic and diastolic congestive heart failure (HCC) . Acute respiratory failure with hypercapnia  (HCC) . Bilateral pneumonia . Sepsis (HCC) . COPD mixed type (HCC) . Hypothermia . Hyperglycemia, drug-induced . Morbid obesity (HCC)  I have reviewed the medical record, interviewed the patient and family, and examined the patient. The following aspects are pertinent.  Past Medical History  Diagnosis Date  . Hypertension     x30 years  . Hyperlipidemia     x 7-8  . COPD (chronic obstructive pulmonary disease) (HCC)   . Obesity   . Chronic pain   . CHF (congestive heart failure) (HCC)   . Atrial fibrillation [facility])    Social History   Social History  . Marital Status: Single    Spouse Name: N/A  . Number of Children: N/A  . Years of Education: N/A   Social History Main Topics  . Smoking status: Former Smoker -- 2.50 packs/day for 25 years    Types: Cigarettes    Quit date: 02/23/1991  . Smokeless tobacco: Never Used  . Alcohol Use: None  . Drug Use: None  . Sexual Activity: Not Asked   Other Topics Concern  . None   Social History Narrative   Family History  Problem Relation Age of Onset  . Cancer Mother   . Asthma Father   . Heart attack Brother     same mother.  MI in his 13s  . Coronary artery disease Other    Scheduled Meds: . aspirin EC  81 mg Oral Daily  . enoxaparin (LOVENOX) injection  60 mg Subcutaneous Q24H  . famotidine  20 mg Per Tube Daily  . furosemide  40 mg Oral Daily  . insulin aspart  0-15 Units Subcutaneous 6 times per day  . metoprolol tartrate  25 mg Oral BID  . predniSONE  20 mg Oral Q breakfast  . sodium chloride  3 mL Intravenous Q12H   Continuous Infusions: . dextrose 5 % and 0.9 % NaCl with KCl 20 mEq/L 70 mL/hr at 12/08/15 0442   PRN Meds:.albuterol, fentaNYL (SUBLIMAZE) injection, LORazepam Medications Prior to Admission:  Prior to Admission medications   Medication Sig Start Date End Date Taking? Authorizing Provider  albuterol (PROVENTIL HFA;VENTOLIN HFA) 108 (90 BASE) MCG/ACT inhaler Inhale 2 puffs into the lungs every  6 (six) hours as needed for wheezing or shortness of breath.   Yes Historical Provider, MD  albuterol (PROVENTIL) (2.5 MG/3ML) 0.083% nebulizer solution Take 3 mLs (2.5 mg total) by nebulization every 6 (six) hours as needed for wheezing or shortness of breath. 08/04/15  Yes Erick Blinks, MD  amLODipine (NORVASC) 10 MG tablet Take 10 mg by mouth daily.   Yes Historical Provider, MD  aspirin 81 MG EC tablet Take 81 mg by mouth daily. 09/01/15  Yes Historical Provider, MD  budesonide-formoterol (SYMBICORT) 160-4.5 MCG/ACT inhaler Inhale 2 puffs into the lungs 2 (two) times daily. 05/28/13  Yes Ileana Ladd, MD  diltiazem (CARDIZEM CD) 360 MG 24 hr capsule Take 1 capsule (360 mg total) by mouth daily. 09/09/15  Yes Rollene Rotunda, MD  furosemide (LASIX) 20 MG tablet TAKE 1 TABLET (20 MG TOTAL) BY MOUTH DAILY. 11/04/15  Yes Rollene Rotunda, MD  guaiFENesin (MUCINEX) 600 MG 12 hr tablet Take 2 tablets (1,200 mg total) by mouth 2 (two) times daily. 08/04/15  Yes Erick Blinks, MD  LORazepam (ATIVAN) 1 MG tablet Take 1 tablet (1 mg total) by mouth every 8 (eight) hours as needed for anxiety. 08/17/15  Yes Mechele Claude, MD  metoprolol (LOPRESSOR) 50 MG tablet TAKE 1 TABLET (50 MG TOTAL) BY MOUTH DAILY. 09/28/15  Yes Mechele Claude, MD  pravastatin (PRAVACHOL) 40 MG tablet TAKE 1 TABLET (40 MG TOTAL) BY MOUTH DAILY. 11/12/15  Yes Mechele Claude, MD  tamsulosin (FLOMAX) 0.4 MG CAPS capsule Take 1 capsule by mouth daily. 09/09/15  Yes Historical Provider, MD  traMADol (ULTRAM) 50 MG tablet TAKE 1 TABLET BY MOUTH EVERY eight HOURS AS NEEDED FOR MODERATE PAIN 08/17/15  Yes Mechele Claude, MD   No Known Allergies  Review of Systems  HENT: Negative.   Respiratory: Negative.  Negative for cough.   Cardiovascular: Negative.   Endocrine: Negative.   Musculoskeletal:       Weakness  Neurological: Negative.   All other systems reviewed and are negative.   Physical Exam  Nursing note and vitals  reviewed. Constitutional: He is oriented to person, place, and time. He appears well-developed and well-nourished. No distress.  Cardiovascular: Normal rate and regular rhythm.   Respiratory: Effort normal and breath sounds normal. No respiratory distress.  GI: Soft. He exhibits no distension. There is no tenderness.  Musculoskeletal: Normal range of motion.  Neurological: He is alert and oriented to person, place, and time.  Skin: Skin is warm and dry.  Psychiatric: He has a normal mood and affect. Thought content normal.    Vital Signs: BP 121/72 mmHg  Pulse 86  Temp(Src) 98 F (36.7 C) (Oral)  Resp 20  Ht 6' (1.829 m)  Wt 100.336 kg (221 lb 3.2 oz)  BMI 29.99 kg/m2  SpO2 94%  SpO2: SpO2: 94 % O2 Device:SpO2: 94 % O2 Flow Rate: .O2 Flow Rate (L/min): 2 L/min  IO: Intake/output summary:  Intake/Output Summary (Last 24 hours) at 12/08/15 1442 Last data filed at 12/08/15 0947  Gross per 24 hour  Intake    120 ml  Output    950 ml  Net   -830 ml    LBM: Last BM Date: 12/07/15 Baseline Weight: Weight: 108.41 kg (239 lb) Most recent weight: Weight: 100.336 kg (221 lb 3.2 oz)      Palliative Assessment/Data:  Flowsheet Rows        Most Recent Value   Intake Tab    Referral Department  Hospitalist   Unit at Time of Referral  ICU   Palliative Care Primary Diagnosis  Pulmonary   Date Notified  12/05/15   Palliative Care Type  New Palliative care   Reason for referral  Clarify Goals of Care, Psychosocial or Spiritual support, Advance Care Planning   Date of Admission  11/28/15   Date first seen by Palliative Care  12/07/15   # of days Palliative referral response time  2 Day(s)   # of days IP prior to Palliative referral  7   Clinical Assessment    Palliative Performance Scale Score  40%   Pain Max last 24 hours  Not able to report   Pain Min Last 24 hours  Not able to report   Dyspnea Max Last  24 Hours  Not able to report   Dyspnea Min Last 24 hours  Not able to  report   Psychosocial & Spiritual Assessment    Social Work Plan of Care  Staff support   Palliative Care Outcomes    Patient/Family meeting held?  No   Palliative Care follow-up planned  Yes, Facility      Additional Data Reviewed:  CBC:    Component Value Date/Time   WBC 8.4 12/07/2015 0452   WBC 10.2 09/08/2015 1201   WBC 6.1 03/20/2015 0939   HGB 14.7 12/07/2015 0452   HGB 15.9 03/20/2015 0939   HCT 46.6 12/07/2015 0452   HCT 46.7 09/08/2015 1201   HCT 52.2 03/20/2015 0939   PLT 174 12/07/2015 0452   PLT 168 09/08/2015 1201   MCV 94.5 12/07/2015 0452   MCV 91 09/08/2015 1201   MCV 91.7 03/20/2015 0939   NEUTROABS 6.5 11/28/2015 2125   NEUTROABS 8.5* 09/08/2015 1201   LYMPHSABS 0.5* 11/28/2015 2125   LYMPHSABS 0.7 09/08/2015 1201   MONOABS 0.3 11/28/2015 2125   EOSABS 0.0 11/28/2015 2125   EOSABS 0.0 09/08/2015 1201   BASOSABS 0.0 11/28/2015 2125   BASOSABS 0.0 09/08/2015 1201   Comprehensive Metabolic Panel:    Component Value Date/Time   NA 143 12/07/2015 0452   NA 141 09/08/2015 1201   K 3.5 12/07/2015 0452   CL 101 12/07/2015 0452   CO2 34* 12/07/2015 0452   BUN 30* 12/07/2015 0452   BUN 14 09/08/2015 1201   CREATININE 0.89 12/07/2015 0452   CREATININE 1.29 05/28/2013 1050   GLUCOSE 92 12/07/2015 0452   GLUCOSE 95 09/08/2015 1201   CALCIUM 9.2 12/07/2015 0452   AST 18 11/28/2015 2125   ALT 13* 11/28/2015 2125   ALKPHOS 75 11/28/2015 2125   BILITOT 1.6* 11/28/2015 2125   BILITOT 1.0 09/08/2015 1201   PROT 6.8 11/28/2015 2125   PROT 6.2 09/08/2015 1201   ALBUMIN 3.4* 11/28/2015 2125   ALBUMIN 3.8 09/08/2015 1201     Time In: 1300 Time Out: 1430 Time Total: 90 minutes GOC discussion shared with nursing staff, Cm, SW, and Dr. Ardyth Harps.  Greater than 50%  of this time was spent counseling and coordinating care related to the above assessment and plan.  Signed by: Katheran Awe, NP  Katheran Awe, NP  12/08/2015, 2:42 PM  Please contact  Palliative Medicine Team phone at [phone] for questions and concerns.

## 2015-12-08 NOTE — Progress Notes (Addendum)
Pt IV and foley catheter removed, tolerated well.  Pt family here to transport pt to [facility].  Report called to Merita Norton at [facility], answered all questions at this time.

## 2015-12-08 NOTE — Plan of Care (Signed)
Problem: Acute Rehab PT Goals(only PT should resolve) Goal: Pt Will Ambulate Pt will ambulate with RW at Supervision using a step-through pattern and equal step length for a distances greater than 12ft to demonstrate the ability to perform safe household distance ambulation at discharge.    Goal: Pt Will Go Up/Down Stairs Pt will ascend/descend 10 stairs with LRAD and 1 HR at Supervision to demonstrate safe entry/exit of home.

## 2015-12-22 ENCOUNTER — Ambulatory Visit: Payer: Medicare Other | Admitting: Family Medicine

## 2015-12-30 ENCOUNTER — Ambulatory Visit: Payer: Self-pay | Admitting: Cardiology

## 2015-12-30 DIAGNOSIS — J440 Chronic obstructive pulmonary disease with acute lower respiratory infection: Secondary | ICD-10-CM | POA: Diagnosis not present

## 2015-12-30 DIAGNOSIS — J189 Pneumonia, unspecified organism: Secondary | ICD-10-CM | POA: Diagnosis not present

## 2015-12-30 DIAGNOSIS — I5042 Chronic combined systolic (congestive) and diastolic (congestive) heart failure: Secondary | ICD-10-CM | POA: Diagnosis not present

## 2015-12-30 DIAGNOSIS — I110 Hypertensive heart disease with heart failure: Secondary | ICD-10-CM | POA: Diagnosis not present

## 2015-12-31 DIAGNOSIS — J440 Chronic obstructive pulmonary disease with acute lower respiratory infection: Secondary | ICD-10-CM | POA: Diagnosis not present

## 2015-12-31 DIAGNOSIS — I5042 Chronic combined systolic (congestive) and diastolic (congestive) heart failure: Secondary | ICD-10-CM | POA: Diagnosis not present

## 2015-12-31 DIAGNOSIS — J189 Pneumonia, unspecified organism: Secondary | ICD-10-CM | POA: Diagnosis not present

## 2015-12-31 DIAGNOSIS — I110 Hypertensive heart disease with heart failure: Secondary | ICD-10-CM | POA: Diagnosis not present

## 2016-01-01 DIAGNOSIS — J440 Chronic obstructive pulmonary disease with acute lower respiratory infection: Secondary | ICD-10-CM | POA: Diagnosis not present

## 2016-01-01 DIAGNOSIS — Z9981 Dependence on supplemental oxygen: Secondary | ICD-10-CM | POA: Diagnosis not present

## 2016-01-01 DIAGNOSIS — J189 Pneumonia, unspecified organism: Secondary | ICD-10-CM | POA: Diagnosis not present

## 2016-01-01 DIAGNOSIS — J449 Chronic obstructive pulmonary disease, unspecified: Secondary | ICD-10-CM | POA: Diagnosis not present

## 2016-01-01 DIAGNOSIS — M6281 Muscle weakness (generalized): Secondary | ICD-10-CM | POA: Diagnosis not present

## 2016-01-01 DIAGNOSIS — I5042 Chronic combined systolic (congestive) and diastolic (congestive) heart failure: Secondary | ICD-10-CM | POA: Diagnosis not present

## 2016-01-01 DIAGNOSIS — I110 Hypertensive heart disease with heart failure: Secondary | ICD-10-CM | POA: Diagnosis not present

## 2016-01-03 DIAGNOSIS — J449 Chronic obstructive pulmonary disease, unspecified: Secondary | ICD-10-CM | POA: Diagnosis not present

## 2016-01-04 DIAGNOSIS — I5042 Chronic combined systolic (congestive) and diastolic (congestive) heart failure: Secondary | ICD-10-CM | POA: Diagnosis not present

## 2016-01-04 DIAGNOSIS — I110 Hypertensive heart disease with heart failure: Secondary | ICD-10-CM | POA: Diagnosis not present

## 2016-01-04 DIAGNOSIS — J189 Pneumonia, unspecified organism: Secondary | ICD-10-CM | POA: Diagnosis not present

## 2016-01-04 DIAGNOSIS — J440 Chronic obstructive pulmonary disease with acute lower respiratory infection: Secondary | ICD-10-CM | POA: Diagnosis not present

## 2016-01-05 DIAGNOSIS — I5042 Chronic combined systolic (congestive) and diastolic (congestive) heart failure: Secondary | ICD-10-CM | POA: Diagnosis not present

## 2016-01-05 DIAGNOSIS — I110 Hypertensive heart disease with heart failure: Secondary | ICD-10-CM | POA: Diagnosis not present

## 2016-01-05 DIAGNOSIS — J189 Pneumonia, unspecified organism: Secondary | ICD-10-CM | POA: Diagnosis not present

## 2016-01-05 DIAGNOSIS — J440 Chronic obstructive pulmonary disease with acute lower respiratory infection: Secondary | ICD-10-CM | POA: Diagnosis not present

## 2016-01-06 ENCOUNTER — Ambulatory Visit: Payer: Self-pay | Admitting: Urology

## 2016-01-06 DIAGNOSIS — J440 Chronic obstructive pulmonary disease with acute lower respiratory infection: Secondary | ICD-10-CM | POA: Diagnosis not present

## 2016-01-06 DIAGNOSIS — J189 Pneumonia, unspecified organism: Secondary | ICD-10-CM | POA: Diagnosis not present

## 2016-01-06 DIAGNOSIS — I110 Hypertensive heart disease with heart failure: Secondary | ICD-10-CM | POA: Diagnosis not present

## 2016-01-06 DIAGNOSIS — I5042 Chronic combined systolic (congestive) and diastolic (congestive) heart failure: Secondary | ICD-10-CM | POA: Diagnosis not present

## 2016-01-07 DIAGNOSIS — J189 Pneumonia, unspecified organism: Secondary | ICD-10-CM | POA: Diagnosis not present

## 2016-01-07 DIAGNOSIS — I110 Hypertensive heart disease with heart failure: Secondary | ICD-10-CM | POA: Diagnosis not present

## 2016-01-07 DIAGNOSIS — J440 Chronic obstructive pulmonary disease with acute lower respiratory infection: Secondary | ICD-10-CM | POA: Diagnosis not present

## 2016-01-07 DIAGNOSIS — I5042 Chronic combined systolic (congestive) and diastolic (congestive) heart failure: Secondary | ICD-10-CM | POA: Diagnosis not present

## 2016-01-08 ENCOUNTER — Ambulatory Visit (INDEPENDENT_AMBULATORY_CARE_PROVIDER_SITE_OTHER): Payer: Medicare Other

## 2016-01-08 ENCOUNTER — Ambulatory Visit (INDEPENDENT_AMBULATORY_CARE_PROVIDER_SITE_OTHER): Payer: Medicare Other | Admitting: Family Medicine

## 2016-01-08 ENCOUNTER — Encounter: Payer: Self-pay | Admitting: Family Medicine

## 2016-01-08 VITALS — BP 113/65 | HR 92 | Temp 98.5°F | Ht 72.0 in | Wt 222.0 lb

## 2016-01-08 DIAGNOSIS — J9602 Acute respiratory failure with hypercapnia: Secondary | ICD-10-CM | POA: Diagnosis not present

## 2016-01-08 DIAGNOSIS — J189 Pneumonia, unspecified organism: Secondary | ICD-10-CM

## 2016-01-08 DIAGNOSIS — J449 Chronic obstructive pulmonary disease, unspecified: Secondary | ICD-10-CM | POA: Diagnosis not present

## 2016-01-08 DIAGNOSIS — I10 Essential (primary) hypertension: Secondary | ICD-10-CM | POA: Diagnosis not present

## 2016-01-08 DIAGNOSIS — I5042 Chronic combined systolic (congestive) and diastolic (congestive) heart failure: Secondary | ICD-10-CM

## 2016-01-08 DIAGNOSIS — E669 Obesity, unspecified: Secondary | ICD-10-CM | POA: Diagnosis not present

## 2016-01-08 NOTE — Progress Notes (Signed)
Subjective:  Patient ID: Logan West, male    DOB: 1934/08/22  Age: 82 y.o. MRN: [id number]  CC: Hospitalization Follow-up   HPI Logan West presents for COPD, recent pneumonia. Hospital report reviewed showing pt. Was in ICU on ventilator, but weaned to [facility] during hospital stay. Now he is  Confused about medications. He finished the antibiotic.   Also having to urinate frequently. Up at night some. Not sure if he is taking tamsulosin once, twice a day or not at all. Thinks it is twice.    follow-up of hypertension. Patient has no history of headache chest pain or shortness of breath or recent cough. Patient also denies symptoms of TIA such as numbness weakness lateralizing. Patient checks  blood pressure at home and has not had any elevated readings recently. Patient denies side effects from his medication. States taking it regularly.    History Logan West has a past medical history of Hypertension; Hyperlipidemia; COPD (chronic obstructive pulmonary disease) ([place]); Obesity; Chronic pain; CHF (congestive heart failure) ([place]); and Atrial fibrillation ([place]).   He has past surgical history that includes Percutaneous pinning and Fracture surgery.   His family history includes Asthma in his father; Cancer in his mother; Coronary artery disease in his other; Heart attack in his brother.He reports that he quit smoking about 24 years ago. His smoking use included Cigarettes. He has a 62.5 pack-year smoking history. He has never used smokeless tobacco. His alcohol and drug histories are not on file.    ROS Review of Systems  Constitutional: Negative for fever, chills, diaphoresis and unexpected weight change.  HENT: Negative for congestion, hearing loss, rhinorrhea and sore throat.   Eyes: Negative for visual disturbance.  Respiratory: Positive for shortness of breath (O2 dependent. Home pulse ox remains 92-95 as long as on 3 liters.). Negative for cough.   Cardiovascular: Negative for chest  pain.  Gastrointestinal: Negative for abdominal pain, diarrhea and constipation.  Genitourinary: Negative for dysuria and flank pain.  Musculoskeletal: Negative for joint swelling and arthralgias.  Skin: Negative for rash.  Neurological: Negative for dizziness and headaches.  Psychiatric/Behavioral: Negative for sleep disturbance and dysphoric mood.    Objective:  BP 113/65 mmHg  Pulse 92  Temp(Src) 98.5 F (36.9 C) (Oral)  Ht 6' (1.829 m)  Wt 222 lb (100.699 kg)  BMI 30.10 kg/m2  BP Readings from Last 3 Encounters:  01/08/16 113/65  12/08/15 143/82  11/04/15 99/58    Wt Readings from Last 3 Encounters:  01/08/16 222 lb (100.699 kg)  12/08/15 221 lb 3.2 oz (100.336 kg)  11/04/15 239 lb (108.41 kg)     Physical Exam  Constitutional: He is oriented to person, place, and time. He appears well-developed and well-nourished. No distress.  HENT:  Head: Normocephalic and atraumatic.  Right Ear: External ear normal.  Left Ear: External ear normal.  Nose: Nose normal.  Mouth/Throat: Oropharynx is clear and moist.  Eyes: Conjunctivae and EOM are normal. Pupils are equal, round, and reactive to light.  Neck: Normal range of motion. Neck supple. No thyromegaly present.  Cardiovascular: Normal rate, regular rhythm and normal heart sounds.   No murmur heard. Pulmonary/Chest: Effort normal and breath sounds normal. No respiratory distress. He has no wheezes. He has no rales.  Abdominal: Soft. Bowel sounds are normal. He exhibits no distension. There is no tenderness.  Lymphadenopathy:    He has no cervical adenopathy.  Neurological: He is alert and oriented to person, place, and time. He has normal reflexes.  Skin: Skin is warm and dry.  Psychiatric: He has a normal mood and affect. His behavior is normal. Judgment and thought content normal.     Lab Results  Component Value Date   WBC 8.4 12/07/2015   HGB 14.7 12/07/2015   HCT 46.6 12/07/2015   PLT 174 12/07/2015   GLUCOSE  92 12/07/2015   CHOL 178 09/08/2015   TRIG 83 12/03/2015   HDL 81 09/08/2015   LDLCALC 78 09/08/2015   ALT 13* 11/28/2015   AST 18 11/28/2015   NA 143 12/07/2015   K 3.5 12/07/2015   CL 101 12/07/2015   CREATININE 0.89 12/07/2015   BUN 30* 12/07/2015   CO2 34* 12/07/2015   TSH 0.750 11/29/2015   PSA 1.9 03/31/2014   INR 1.38 08/27/2015   HGBA1C 6.0* 08/27/2015    Dg Chest Port 1 View  11/29/2015  CLINICAL DATA:  Respiratory failure EXAM: PORTABLE CHEST 1 VIEW COMPARISON:  Chest x-rays dated 11/28/2015 and 08/27/2015. FINDINGS: Endotracheal tube remains well positioned with tip just above the level of the carina. Enteric tube passes below the diaphragm. Mild cardiomegaly is unchanged. Overall cardiomediastinal silhouette is stable in size and configuration. Central pulmonary vascular congestion persists with mild bilateral interstitial edema, unchanged compared to yesterday's exam, significantly worsened compared to exam of 09/08/2015. Some component of the perihilar opacities likely related to chronic underlying pulmonary artery hypertension. Suspect additional mild bibasilar atelectasis and/or small pleural effusions. No new lung abnormality. IMPRESSION: 1. Overall stable appearance compared to chest x-ray of 11/28/2015. 2. Persistent central pulmonary vascular congestion and mild bilateral interstitial edema, not significantly changed in the short-term interval, suggesting mild volume overload/CHF. Patchy bibasilar airspace opacities are also unchanged, compatible with either atelectasis or pneumonia, favor atelectasis. Also suspect small bilateral pleural effusions. 3. Some component of the perihilar opacities likely related to chronic pulmonary artery enlargement caused by underlying pulmonary artery hypertension. 4. Endotracheal tube remains well positioned with tip just above the level of the carina. Electronically Signed   By: Franki Cabot M.D.   On: 11/29/2015 11:07   Dg Chest Portable  1 View  11/28/2015  CLINICAL DATA:  Respiratory distress status post intubation. EXAM: PORTABLE CHEST 1 VIEW COMPARISON:  09/08/2015 FINDINGS: Endotracheal tube overlies the tracheal air column and terminates less than 1 cm superior to the carina. The cardiac silhouette is normal. Mediastinal contours appear intact considering portable technique. There is no evidence of pleural effusion or pneumothorax. There are bilateral lower lobe predominant patchy airspace opacities Osseous structures are without acute abnormality. Soft tissues are grossly normal. IMPRESSION: Status post intubation with endotracheal tube terminating less than a cm from the carina. Bilateral patchy airspace consolidation with lower lobe predominance. These may represent developing pulmonary edema or multifocal pneumonia. Electronically Signed   By: Fidela Salisbury M.D.   On: 11/28/2015 21:43    Assessment & Plan:   Logan West was seen today for hospitalization follow-up.  Diagnoses and all orders for this visit:  Acute respiratory failure with hypercapnia ([place]) -     CMP14+EGFR -     Brain natriuretic peptide -     DG Chest 2 View; Future  Bilateral pneumonia -     CBC with Differential/Platelet -     CMP14+EGFR -     Brain natriuretic peptide -     DG Chest 2 View; Future  Chronic combined systolic and diastolic congestive heart failure (HCC) -     CMP14+EGFR -     Brain natriuretic peptide -  DG Chest 2 View; Future  COPD mixed type (HCC) -     CMP14+EGFR -     Brain natriuretic peptide -     DG Chest 2 View; Future  Essential hypertension -     CMP14+EGFR -     Brain natriuretic peptide  Obesity -     CMP14+EGFR -     Brain natriuretic peptide      I am having Mr. Pickney maintain his budesonide-formoterol, albuterol, guaiFENesin, LORazepam, aspirin, tamsulosin, metoprolol, amLODipine, furosemide, pravastatin, and traMADol.  Meds ordered this encounter  Medications  . traMADol (ULTRAM) 50 MG  tablet    Sig: TAKE 1 TABLET EVERY 8 HOURS AS NEEDED FOR MODERATE PAIN    Refill:  5     Follow-up: Return in about 1 month (around 02/05/2016).  Claretta Fraise, M.D.

## 2016-01-08 NOTE — Progress Notes (Signed)
Quick Note:  Your chest x-ray looked normal. Thanks, WS. ______ 

## 2016-01-09 LAB — CBC WITH DIFFERENTIAL/PLATELET
BASOS ABS: 0 10*3/uL (ref 0.0–0.2)
Basos: 0 %
EOS (ABSOLUTE): 0.1 10*3/uL (ref 0.0–0.4)
Eos: 1 %
HEMOGLOBIN: 14.2 g/dL (ref 12.6–17.7)
Hematocrit: 43.6 % (ref 37.5–51.0)
IMMATURE GRANS (ABS): 0 10*3/uL (ref 0.0–0.1)
Immature Granulocytes: 0 %
LYMPHS: 18 %
Lymphocytes Absolute: 1.7 10*3/uL (ref 0.7–3.1)
MCH: 29.2 pg (ref 26.6–33.0)
MCHC: 32.6 g/dL (ref 31.5–35.7)
MCV: 90 fL (ref 79–97)
MONOCYTES: 8 %
Monocytes Absolute: 0.8 10*3/uL (ref 0.1–0.9)
Neutrophils Absolute: 6.7 10*3/uL (ref 1.4–7.0)
Neutrophils: 73 %
PLATELETS: 249 10*3/uL (ref 150–379)
RBC: 4.87 x10E6/uL (ref 4.14–5.80)
RDW: 14.7 % (ref 12.3–15.4)
WBC: 9.3 10*3/uL (ref 3.4–10.8)

## 2016-01-09 LAB — CMP14+EGFR
ALBUMIN: 3.8 g/dL (ref 3.5–4.8)
ALK PHOS: 90 IU/L (ref 39–117)
ALT: 9 IU/L (ref 0–44)
AST: 15 IU/L (ref 0–40)
Albumin/Globulin Ratio: 1.2 (ref 1.1–2.5)
BUN / CREAT RATIO: 14 (ref 10–22)
BUN: 13 mg/dL (ref 8–27)
Bilirubin Total: 0.8 mg/dL (ref 0.0–1.2)
CHLORIDE: 99 mmol/L (ref 96–106)
CO2: 28 mmol/L (ref 18–29)
CREATININE: 0.9 mg/dL (ref 0.76–1.27)
Calcium: 9.8 mg/dL (ref 8.6–10.2)
GFR calc Af Amer: 94 mL/min/{1.73_m2} (ref >59)
GFR calc non Af Amer: 81 mL/min/{1.73_m2} (ref >59)
GLUCOSE: 104 mg/dL — AB (ref 65–99)
Globulin, Total: 3.2 g/dL (ref 1.5–4.5)
Potassium: 4.3 mmol/L (ref 3.5–5.2)
Sodium: 146 mmol/L — ABNORMAL HIGH (ref 134–144)
TOTAL PROTEIN: 7 g/dL (ref 6.0–8.5)

## 2016-01-09 LAB — BRAIN NATRIURETIC PEPTIDE: BNP: 162 pg/mL — AB (ref 0.0–100.0)

## 2016-01-11 DIAGNOSIS — I110 Hypertensive heart disease with heart failure: Secondary | ICD-10-CM | POA: Diagnosis not present

## 2016-01-11 DIAGNOSIS — I5042 Chronic combined systolic (congestive) and diastolic (congestive) heart failure: Secondary | ICD-10-CM | POA: Diagnosis not present

## 2016-01-11 DIAGNOSIS — J440 Chronic obstructive pulmonary disease with acute lower respiratory infection: Secondary | ICD-10-CM | POA: Diagnosis not present

## 2016-01-11 DIAGNOSIS — J189 Pneumonia, unspecified organism: Secondary | ICD-10-CM | POA: Diagnosis not present

## 2016-01-12 DIAGNOSIS — J440 Chronic obstructive pulmonary disease with acute lower respiratory infection: Secondary | ICD-10-CM | POA: Diagnosis not present

## 2016-01-12 DIAGNOSIS — I5042 Chronic combined systolic (congestive) and diastolic (congestive) heart failure: Secondary | ICD-10-CM | POA: Diagnosis not present

## 2016-01-12 DIAGNOSIS — I110 Hypertensive heart disease with heart failure: Secondary | ICD-10-CM | POA: Diagnosis not present

## 2016-01-12 DIAGNOSIS — J189 Pneumonia, unspecified organism: Secondary | ICD-10-CM | POA: Diagnosis not present

## 2016-01-13 ENCOUNTER — Ambulatory Visit (INDEPENDENT_AMBULATORY_CARE_PROVIDER_SITE_OTHER): Payer: Medicare Other | Admitting: Cardiology

## 2016-01-13 ENCOUNTER — Encounter: Payer: Self-pay | Admitting: Cardiology

## 2016-01-13 VITALS — BP 106/63 | HR 83 | Ht 68.0 in | Wt 225.0 lb

## 2016-01-13 DIAGNOSIS — I480 Paroxysmal atrial fibrillation: Secondary | ICD-10-CM | POA: Diagnosis not present

## 2016-01-13 NOTE — Progress Notes (Signed)
Cardiology Office Note   Date:  01/13/2016   ID:  Logan West, DOB 12/02/1933, MRN [id number]  PCP:  Mechele Claude, MD  Cardiologist:   Rollene Rotunda, MD   No chief complaint on file.     History of Present Illness: Logan West is a 82 y.o. male who presents for follow up of atrial fibrillation with a rapid ventricular rate.  He does have a mildly reduced EF.   He has not wanted a right and left heart cath which I suggested.  He has had atrial fib and was on Eliquis.  He has had hematuria but this resolved.  He has not wanted to be back on anticoagulation because of cost in part.  Since I last saw him he was in the hospital in Jan.  I reviewed these records.  He had respiratory failure with pneumonia and was intubated.  He went to a nursing him for 3 weeks afterward and is home now.  He has chronic lung disease and is on oxygen.  The patient denies any new symptoms such as chest discomfort, neck or arm discomfort. There has been no new.  He has a home scale provided by [place] and says that his weight at home is staying at about 219 and not fluctuating.  He watches his salt.  Past Medical History  Diagnosis Date  . Hypertension     x30 years  . Hyperlipidemia     x 7-8  . COPD (chronic obstructive pulmonary disease) (HCC)   . Obesity   . Chronic pain   . CHF (congestive heart failure) (HCC)   . Atrial fibrillation [facility])     Past Surgical History  Procedure Laterality Date  . Percutaneous pinning      right forearm fracture  . Fracture surgery      right forearm     Current Outpatient Prescriptions  Medication Sig Dispense Refill  . albuterol (PROVENTIL HFA;VENTOLIN HFA) 108 (90 BASE) MCG/ACT inhaler Inhale 2 puffs into the lungs every 6 (six) hours as needed for wheezing or shortness of breath.    Marland Kitchen amLODipine (NORVASC) 10 MG tablet Take 10 mg by mouth daily.    Marland Kitchen aspirin 81 MG EC tablet Take 81 mg by mouth daily.  0  . budesonide-formoterol (SYMBICORT) 160-4.5  MCG/ACT inhaler Inhale 2 puffs into the lungs 2 (two) times daily. 1 Inhaler 5  . furosemide (LASIX) 20 MG tablet TAKE 1 TABLET (20 MG TOTAL) BY MOUTH DAILY. 30 tablet 11  . guaiFENesin (MUCINEX) 600 MG 12 hr tablet Take 2 tablets (1,200 mg total) by mouth 2 (two) times daily. 20 tablet 1  . LORazepam (ATIVAN) 1 MG tablet Take 1 tablet (1 mg total) by mouth every 8 (eight) hours as needed for anxiety. 60 tablet 5  . metoprolol (LOPRESSOR) 50 MG tablet TAKE 1 TABLET (50 MG TOTAL) BY MOUTH DAILY. 30 tablet 5  . pravastatin (PRAVACHOL) 40 MG tablet TAKE 1 TABLET (40 MG TOTAL) BY MOUTH DAILY. 30 tablet 3  . tamsulosin (FLOMAX) 0.4 MG CAPS capsule Take 1 capsule by mouth daily.    . traMADol (ULTRAM) 50 MG tablet TAKE 1 TABLET EVERY 8 HOURS AS NEEDED FOR MODERATE PAIN  5   No current facility-administered medications for this visit.    Allergies:   Review of patient's allergies indicates no known allergies.    ROS:  Please see the history of present illness.   Otherwise, review of systems are positive for none.   All  other systems are reviewed and negative.    PHYSICAL EXAM: VS:  BP 106/63 mmHg  Pulse 83  Ht  (1.727 m)  Wt 225 lb (102.059 kg)  BMI 34.22 kg/m2 , BMI Body mass index is 34.22 kg/(m^2). GENERAL:  Chronically ill-appearing HEENT:  Pupils equal round and reactive, fundi not visualized, oral mucosa unremarkable NECK:  No jugular venous distention, waveform within normal limits, carotid upstroke brisk and symmetric, no bruits, no thyromegaly LUNGS:  Decreased breath sounds bilaterally with few scattered crackles BACK:  No CVA tenderness CHEST:  Unremarkable HEART:  PMI not displaced or sustained,S1 and S2 within normal limits, no S3,  no clicks, no rubs, no murmurs, irregular ABD:  Flat, positive bowel sounds normal in frequency in pitch, no bruits, no rebound, no guarding, no midline pulsatile mass, no hepatomegaly, no splenomegaly EXT:  2 plus pulses throughout, mild edema,  no cyanosis no clubbing   EKG:  EKG is not ordered today.   Recent Labs: 11/29/2015: TSH 0.750 12/07/2015: Hemoglobin 14.7 01/08/2016: ALT 9; BNP 162.0*; BUN 13; Creatinine, Ser 0.90; Platelets 249; Potassium 4.3; Sodium 146*    Lipid Panel    Component Value Date/Time   CHOL 178 09/08/2015 1201   CHOL 142 05/28/2013 1050   TRIG 83 12/03/2015 0437   TRIG 93 05/28/2013 1050   HDL 81 09/08/2015 1201   HDL 38* 05/28/2013 1050   CHOLHDL 2.2 09/08/2015 1201   LDLCALC 78 09/08/2015 1201   LDLCALC 85 05/28/2013 1050      Wt Readings from Last 3 Encounters:  01/13/16 225 lb (102.059 kg)  01/08/16 222 lb (100.699 kg)  12/08/15 221 lb 3.2 oz (100.336 kg)      Other studies Reviewed: Additional studies/ records that were reviewed today include: Hospital records. Review of the above records demonstrates:  Please see elsewhere in the note.     ASSESSMENT AND PLAN:  ATRIAL FIB:    His heart rate seems to be controlled.  Mr. Rhea Thrun has a CHA2DS2 - VASc score of 3 with a risk of stroke of 3.2%.  However, he still doesn't want to take anticoagulation.  He did check with his urologist and apparently he is cleared to restart anticoagulation if he would comply.  At this point he's not willing to take anticoagulation.  He does say that he would like to come back and talk to me about this in a couple of months.  He might reconsider.   CM:  The patient had slightly reduced ejection fraction.  My review of the records demonstrates that this EF was about the same when he had it checked earlier this year. He's not interested in right and left heart catheterization at this point.  We talked about PRN diuretic dosing.    Current medicines are reviewed at length with the patient today.  The patient does not have concerns regarding medicines.  The following changes have been made:  See above  Labs/ tests ordered today include:   None   Disposition:   FU with me in two months.     Signed, Rollene Rotunda, MD  01/13/2016 11:50 AM    [facility]

## 2016-01-13 NOTE — Patient Instructions (Signed)
Medication Instructions:  The current medical regimen is effective;  continue present plan and medications.  Follow-Up: Follow up in May with Dr Antoine Poche in the [place] office.  If you need a refill on your cardiac medications before your next appointment, please call your pharmacy.  Thank you for choosing [facility]!!

## 2016-01-14 ENCOUNTER — Encounter: Payer: Self-pay | Admitting: Cardiology

## 2016-01-14 DIAGNOSIS — I5042 Chronic combined systolic (congestive) and diastolic (congestive) heart failure: Secondary | ICD-10-CM | POA: Diagnosis not present

## 2016-01-14 DIAGNOSIS — J189 Pneumonia, unspecified organism: Secondary | ICD-10-CM | POA: Diagnosis not present

## 2016-01-14 DIAGNOSIS — J440 Chronic obstructive pulmonary disease with acute lower respiratory infection: Secondary | ICD-10-CM | POA: Diagnosis not present

## 2016-01-14 DIAGNOSIS — I110 Hypertensive heart disease with heart failure: Secondary | ICD-10-CM | POA: Diagnosis not present

## 2016-01-19 DIAGNOSIS — N138 Other obstructive and reflux uropathy: Secondary | ICD-10-CM | POA: Diagnosis not present

## 2016-01-19 DIAGNOSIS — I482 Chronic atrial fibrillation: Secondary | ICD-10-CM | POA: Diagnosis not present

## 2016-01-19 DIAGNOSIS — J189 Pneumonia, unspecified organism: Secondary | ICD-10-CM | POA: Diagnosis not present

## 2016-01-19 DIAGNOSIS — N401 Enlarged prostate with lower urinary tract symptoms: Secondary | ICD-10-CM | POA: Diagnosis not present

## 2016-01-19 DIAGNOSIS — G894 Chronic pain syndrome: Secondary | ICD-10-CM | POA: Diagnosis not present

## 2016-01-19 DIAGNOSIS — E785 Hyperlipidemia, unspecified: Secondary | ICD-10-CM | POA: Diagnosis not present

## 2016-01-19 DIAGNOSIS — I2510 Atherosclerotic heart disease of native coronary artery without angina pectoris: Secondary | ICD-10-CM | POA: Diagnosis not present

## 2016-01-19 DIAGNOSIS — I110 Hypertensive heart disease with heart failure: Secondary | ICD-10-CM | POA: Diagnosis not present

## 2016-01-19 DIAGNOSIS — I5042 Chronic combined systolic (congestive) and diastolic (congestive) heart failure: Secondary | ICD-10-CM | POA: Diagnosis not present

## 2016-01-19 DIAGNOSIS — J449 Chronic obstructive pulmonary disease, unspecified: Secondary | ICD-10-CM | POA: Diagnosis not present

## 2016-01-19 DIAGNOSIS — J440 Chronic obstructive pulmonary disease with acute lower respiratory infection: Secondary | ICD-10-CM | POA: Diagnosis not present

## 2016-01-20 DIAGNOSIS — I110 Hypertensive heart disease with heart failure: Secondary | ICD-10-CM | POA: Diagnosis not present

## 2016-01-20 DIAGNOSIS — I5042 Chronic combined systolic (congestive) and diastolic (congestive) heart failure: Secondary | ICD-10-CM | POA: Diagnosis not present

## 2016-01-20 DIAGNOSIS — N138 Other obstructive and reflux uropathy: Secondary | ICD-10-CM | POA: Diagnosis not present

## 2016-01-20 DIAGNOSIS — J440 Chronic obstructive pulmonary disease with acute lower respiratory infection: Secondary | ICD-10-CM | POA: Diagnosis not present

## 2016-01-20 DIAGNOSIS — G894 Chronic pain syndrome: Secondary | ICD-10-CM | POA: Diagnosis not present

## 2016-01-20 DIAGNOSIS — I482 Chronic atrial fibrillation: Secondary | ICD-10-CM | POA: Diagnosis not present

## 2016-01-20 DIAGNOSIS — I2510 Atherosclerotic heart disease of native coronary artery without angina pectoris: Secondary | ICD-10-CM | POA: Diagnosis not present

## 2016-01-20 DIAGNOSIS — E785 Hyperlipidemia, unspecified: Secondary | ICD-10-CM | POA: Diagnosis not present

## 2016-01-20 DIAGNOSIS — J189 Pneumonia, unspecified organism: Secondary | ICD-10-CM | POA: Diagnosis not present

## 2016-01-20 DIAGNOSIS — N401 Enlarged prostate with lower urinary tract symptoms: Secondary | ICD-10-CM | POA: Diagnosis not present

## 2016-01-21 DIAGNOSIS — I2510 Atherosclerotic heart disease of native coronary artery without angina pectoris: Secondary | ICD-10-CM | POA: Diagnosis not present

## 2016-01-21 DIAGNOSIS — G894 Chronic pain syndrome: Secondary | ICD-10-CM | POA: Diagnosis not present

## 2016-01-21 DIAGNOSIS — J440 Chronic obstructive pulmonary disease with acute lower respiratory infection: Secondary | ICD-10-CM | POA: Diagnosis not present

## 2016-01-21 DIAGNOSIS — E785 Hyperlipidemia, unspecified: Secondary | ICD-10-CM | POA: Diagnosis not present

## 2016-01-21 DIAGNOSIS — J189 Pneumonia, unspecified organism: Secondary | ICD-10-CM | POA: Diagnosis not present

## 2016-01-21 DIAGNOSIS — N138 Other obstructive and reflux uropathy: Secondary | ICD-10-CM | POA: Diagnosis not present

## 2016-01-21 DIAGNOSIS — N401 Enlarged prostate with lower urinary tract symptoms: Secondary | ICD-10-CM | POA: Diagnosis not present

## 2016-01-21 DIAGNOSIS — I110 Hypertensive heart disease with heart failure: Secondary | ICD-10-CM | POA: Diagnosis not present

## 2016-01-21 DIAGNOSIS — I482 Chronic atrial fibrillation: Secondary | ICD-10-CM | POA: Diagnosis not present

## 2016-01-21 DIAGNOSIS — I5042 Chronic combined systolic (congestive) and diastolic (congestive) heart failure: Secondary | ICD-10-CM | POA: Diagnosis not present

## 2016-01-26 DIAGNOSIS — J189 Pneumonia, unspecified organism: Secondary | ICD-10-CM | POA: Diagnosis not present

## 2016-01-26 DIAGNOSIS — J440 Chronic obstructive pulmonary disease with acute lower respiratory infection: Secondary | ICD-10-CM | POA: Diagnosis not present

## 2016-01-26 DIAGNOSIS — I2510 Atherosclerotic heart disease of native coronary artery without angina pectoris: Secondary | ICD-10-CM | POA: Diagnosis not present

## 2016-01-26 DIAGNOSIS — I5042 Chronic combined systolic (congestive) and diastolic (congestive) heart failure: Secondary | ICD-10-CM | POA: Diagnosis not present

## 2016-01-26 DIAGNOSIS — N138 Other obstructive and reflux uropathy: Secondary | ICD-10-CM | POA: Diagnosis not present

## 2016-01-26 DIAGNOSIS — I482 Chronic atrial fibrillation: Secondary | ICD-10-CM | POA: Diagnosis not present

## 2016-01-26 DIAGNOSIS — N401 Enlarged prostate with lower urinary tract symptoms: Secondary | ICD-10-CM | POA: Diagnosis not present

## 2016-01-26 DIAGNOSIS — G894 Chronic pain syndrome: Secondary | ICD-10-CM | POA: Diagnosis not present

## 2016-01-26 DIAGNOSIS — E785 Hyperlipidemia, unspecified: Secondary | ICD-10-CM | POA: Diagnosis not present

## 2016-01-26 DIAGNOSIS — I110 Hypertensive heart disease with heart failure: Secondary | ICD-10-CM | POA: Diagnosis not present

## 2016-01-27 ENCOUNTER — Ambulatory Visit (INDEPENDENT_AMBULATORY_CARE_PROVIDER_SITE_OTHER): Payer: Medicare Other | Admitting: Family Medicine

## 2016-01-27 ENCOUNTER — Telehealth: Payer: Self-pay | Admitting: Cardiology

## 2016-01-27 ENCOUNTER — Encounter: Payer: Self-pay | Admitting: Family Medicine

## 2016-01-27 VITALS — BP 116/65 | HR 137 | Temp 98.7°F | Ht 68.0 in | Wt 219.6 lb

## 2016-01-27 DIAGNOSIS — G894 Chronic pain syndrome: Secondary | ICD-10-CM | POA: Diagnosis not present

## 2016-01-27 DIAGNOSIS — I5042 Chronic combined systolic (congestive) and diastolic (congestive) heart failure: Secondary | ICD-10-CM | POA: Diagnosis not present

## 2016-01-27 DIAGNOSIS — I10 Essential (primary) hypertension: Secondary | ICD-10-CM

## 2016-01-27 DIAGNOSIS — N401 Enlarged prostate with lower urinary tract symptoms: Secondary | ICD-10-CM | POA: Diagnosis not present

## 2016-01-27 DIAGNOSIS — I2510 Atherosclerotic heart disease of native coronary artery without angina pectoris: Secondary | ICD-10-CM | POA: Diagnosis not present

## 2016-01-27 DIAGNOSIS — I482 Chronic atrial fibrillation, unspecified: Secondary | ICD-10-CM

## 2016-01-27 DIAGNOSIS — E785 Hyperlipidemia, unspecified: Secondary | ICD-10-CM | POA: Diagnosis not present

## 2016-01-27 DIAGNOSIS — J440 Chronic obstructive pulmonary disease with acute lower respiratory infection: Secondary | ICD-10-CM | POA: Diagnosis not present

## 2016-01-27 DIAGNOSIS — N138 Other obstructive and reflux uropathy: Secondary | ICD-10-CM | POA: Diagnosis not present

## 2016-01-27 DIAGNOSIS — J449 Chronic obstructive pulmonary disease, unspecified: Secondary | ICD-10-CM

## 2016-01-27 DIAGNOSIS — J189 Pneumonia, unspecified organism: Secondary | ICD-10-CM | POA: Diagnosis not present

## 2016-01-27 DIAGNOSIS — I110 Hypertensive heart disease with heart failure: Secondary | ICD-10-CM | POA: Diagnosis not present

## 2016-01-27 NOTE — Patient Instructions (Signed)
Thank you for allowing us to care for you today. We strive to provide exceptional quality and compassionate care. Please let us know how we are doing and how we can help serve you better by filling out the survey that you receive from [employer].     

## 2016-01-27 NOTE — Progress Notes (Signed)
Primary Physician: Mechele ClaudeSTACKS,WARREN, MD  Chief Complaint: 82 year old gentleman who was hospitalized about a month ago with pneumonia and COPD. He has been monitored by home health nurse and his blood pressures have been lower than usual. Medicines include metoprolol amlodipine as well as Lasix. He feels weak. He wears 3 L of oxygen continuously and sats usually in the 90s. He denies any shortness of breath dependent edema recently.     Past Medical History  Diagnosis Date  . Hypertension     x30 years  . Hyperlipidemia     x 7-8  . COPD (chronic obstructive pulmonary disease) (HCC)   . Obesity   . Chronic pain   . CHF (congestive heart failure) (HCC)   . Atrial fibrillation (HCC)      Home Meds: Prior to Admission medications   Medication Sig Start Date End Date Taking? Authorizing Provider  albuterol (PROVENTIL HFA;VENTOLIN HFA) 108 (90 BASE) MCG/ACT inhaler Inhale 2 puffs into the lungs every 6 (six) hours as needed for wheezing or shortness of breath.   Yes Historical Provider, MD  amLODipine (NORVASC) 10 MG tablet Take 10 mg by mouth daily.   Yes Historical Provider, MD  aspirin 81 MG EC tablet Take 81 mg by mouth daily. 09/01/15  Yes Historical Provider, MD  budesonide-formoterol (SYMBICORT) 160-4.5 MCG/ACT inhaler Inhale 2 puffs into the lungs 2 (two) times daily. 05/28/13  Yes Ileana LaddFrancis P Wong, MD  furosemide (LASIX) 20 MG tablet TAKE 1 TABLET (20 MG TOTAL) BY MOUTH DAILY. 11/04/15  Yes Rollene RotundaJames Hochrein, MD  guaiFENesin (MUCINEX) 600 MG 12 hr tablet Take 2 tablets (1,200 mg total) by mouth 2 (two) times daily. 08/04/15  Yes Erick BlinksJehanzeb Memon, MD  LORazepam (ATIVAN) 1 MG tablet Take 1 tablet (1 mg total) by mouth every 8 (eight) hours as needed for anxiety. 08/17/15  Yes Mechele ClaudeWarren Stacks, MD  metoprolol (LOPRESSOR) 50 MG tablet TAKE 1 TABLET (50 MG TOTAL) BY MOUTH DAILY. 09/28/15  Yes Mechele ClaudeWarren Stacks, MD  pravastatin (PRAVACHOL) 40 MG tablet TAKE 1 TABLET (40 MG TOTAL) BY MOUTH DAILY. 11/12/15   Yes Mechele ClaudeWarren Stacks, MD  tamsulosin (FLOMAX) 0.4 MG CAPS capsule Take 1 capsule by mouth daily. 09/09/15  Yes Historical Provider, MD  traMADol (ULTRAM) 50 MG tablet TAKE 1 TABLET EVERY 8 HOURS AS NEEDED FOR MODERATE PAIN 11/10/15  Yes Historical Provider, MD    Allergies: No Known Allergies  Social History   Social History  . Marital Status: Single    Spouse Name: N/A  . Number of Children: N/A  . Years of Education: N/A   Occupational History  . Not on file.   Social History Main Topics  . Smoking status: Former Smoker -- 2.50 packs/day for 25 years    Types: Cigarettes    Quit date: 02/23/1991  . Smokeless tobacco: Never Used  . Alcohol Use: Not on file  . Drug Use: Not on file  . Sexual Activity: Not on file   Other Topics Concern  . Not on file   Social History Narrative     Review of Systems:* Constitutional: negative for chills, fever, night sweats; lost weight  fatigue  HEENT: negative for vision changes, hearing loss, congestion, rhinorrhea, ST, epistaxis, or sinus pressure Cardiovascular: negative for chest pain or palpitations Respiratory: negative for hemoptysis, wheezing, shortness of breath, or cough Abdominal: negative for abdominal pain, nausea, vomiting, diarrhea, or constipation Dermatological: negative for rash Neurologic: negative for headache, dizziness, or syncope All other systems reviewed and are otherwise negative  with the exception to those above and in the HPI.   Physical Exam: Blood pressure 116/65, pulse 137, temperature 98.7 F (37.1 C), temperature source Oral, height  (1.727 m), weight 219 lb 9.6 oz (99.61 kg), SpO2 96 %., Body mass index is 33.4 kg/(m^2). General: Well developed, well nourished, in no acute distress. Head: Normocephalic, atraumatic, eyes without discharge, sclera non-icteric, nares are without discharge. Bilateral auditory canals clear, TM's are without perforation, pearly grey and translucent with reflective cone of  light bilaterally. Oral cavity moist, posterior pharynx without exudate, erythema, peritonsillar abscess, or post nasal drip.  Neck: Supple. No thyromegaly. Full ROM. No lymphadenopathy. Lungs: Clear bilaterally to auscultation without wheezes, rales, or rhonchi. Breathing is unlabored. Heart: RRR with S1 S2. No murmurs, rubs, or gallops appreciated. Abdomen: Soft, non-tender, non-distended with normoactive bowel sounds. No hepatomegaly. No rebound/guarding. No obvious abdominal masses. Msk:  Strength and tone normal for age. Extremities/Skin: Warm and dry. No clubbing or cyanosis. No edema. No rashes or suspicious lesions. Neuro: Alert and oriented X 3. Moves all extremities spontaneously. Gait is normal. CNII-XII grossly in tact. Psych:  Responds to questions appropriately with a normal affect.   Labs: none   ASSESSMENT AND PLAN:   1. Chronic atrial fibrillation (HCC) Rhythm remains irregular. Continue metoprolol  2. Essential hypertension Since blood pressures are lower will reduce Norvasc to 5 mg a day and continue to monitor  3. COPD mixed type (HCC) Breathing is okay on 3 L of oxygen and 2 inhalers. I have suggested he cut back on his Lasix to every other day with close monitoring (stressed) on daily weights observation for edema or any change in breathing pattern. He is complaining about staying in the bathroom all the time and with low blood pressure will reduce, reluctantly  Frederica Kuster MD  01/27/2016 8:13 AM

## 2016-01-27 NOTE — Telephone Encounter (Signed)
[facility]. Pt had been running low BP (between 80-90 systolic, ~1-2 weeks problem noted) - this was addressed by PCP - amlodipine dose cut, furosemide dose cut. (See med change documentation I added after verification w [facility] RN).   RN also concerned because pt has been on telemonitoring and HR when checked has been going betw 72-140. Duration ~1 week. Pt intmt SOB, fatigue. Notes rate 122 when checked in home. BP 115/66.  Pt has A Fib history. Gave instruction that he can take extra 1/2 tab of metoprolol to see if improvement of symptoms, but only if BP stable above 100 systolic.  Pt given appt w/ A Fib clinic tomorrow at 9am after discussion w/ Ventura SellersStacy Carter RN (offered 3:30pm today, pt wished to wait due to transportation difficulties). Pt instructed to go to ED if symptoms markedly worse, esp any syncope, near syncope, incr in SOB, or CP. Pt voiced understanding and thanks.

## 2016-01-28 ENCOUNTER — Ambulatory Visit (HOSPITAL_COMMUNITY)
Admission: RE | Admit: 2016-01-28 | Discharge: 2016-01-28 | Disposition: A | Discharge status: Home / Self Care | Payer: Medicare Other | Source: Ambulatory Visit | Attending: Nurse Practitioner | Admitting: Nurse Practitioner

## 2016-01-28 ENCOUNTER — Encounter (HOSPITAL_COMMUNITY): Payer: Self-pay | Admitting: Nurse Practitioner

## 2016-01-28 VITALS — BP 118/76 | HR 108 | Ht 68.0 in | Wt 213.8 lb

## 2016-01-28 DIAGNOSIS — Z8701 Personal history of pneumonia (recurrent): Secondary | ICD-10-CM | POA: Insufficient documentation

## 2016-01-28 DIAGNOSIS — I4891 Unspecified atrial fibrillation: Secondary | ICD-10-CM | POA: Diagnosis present

## 2016-01-28 DIAGNOSIS — E669 Obesity, unspecified: Secondary | ICD-10-CM | POA: Diagnosis not present

## 2016-01-28 DIAGNOSIS — Z87891 Personal history of nicotine dependence: Secondary | ICD-10-CM | POA: Insufficient documentation

## 2016-01-28 DIAGNOSIS — J449 Chronic obstructive pulmonary disease, unspecified: Secondary | ICD-10-CM | POA: Diagnosis not present

## 2016-01-28 DIAGNOSIS — I10 Essential (primary) hypertension: Secondary | ICD-10-CM | POA: Diagnosis not present

## 2016-01-28 DIAGNOSIS — E785 Hyperlipidemia, unspecified: Secondary | ICD-10-CM | POA: Insufficient documentation

## 2016-01-28 DIAGNOSIS — I509 Heart failure, unspecified: Secondary | ICD-10-CM | POA: Diagnosis not present

## 2016-01-28 DIAGNOSIS — G8929 Other chronic pain: Secondary | ICD-10-CM | POA: Insufficient documentation

## 2016-01-28 DIAGNOSIS — Z6832 Body mass index (BMI) 32.0-32.9, adult: Secondary | ICD-10-CM | POA: Insufficient documentation

## 2016-01-28 DIAGNOSIS — Z79899 Other long term (current) drug therapy: Secondary | ICD-10-CM | POA: Insufficient documentation

## 2016-01-28 DIAGNOSIS — I482 Chronic atrial fibrillation, unspecified: Secondary | ICD-10-CM

## 2016-01-28 DIAGNOSIS — Z825 Family history of asthma and other chronic lower respiratory diseases: Secondary | ICD-10-CM | POA: Insufficient documentation

## 2016-01-28 DIAGNOSIS — Z7982 Long term (current) use of aspirin: Secondary | ICD-10-CM | POA: Insufficient documentation

## 2016-01-28 DIAGNOSIS — I110 Hypertensive heart disease with heart failure: Secondary | ICD-10-CM | POA: Diagnosis not present

## 2016-01-28 DIAGNOSIS — Z8249 Family history of ischemic heart disease and other diseases of the circulatory system: Secondary | ICD-10-CM | POA: Diagnosis not present

## 2016-01-28 MED ORDER — FUROSEMIDE 20 MG PO TABS
20.0000 mg | ORAL_TABLET | ORAL | Status: DC
Start: 2016-01-28 — End: 2016-12-26

## 2016-01-28 MED ORDER — DILTIAZEM HCL ER COATED BEADS 180 MG PO CP24: 180.0000 mg | ORAL_CAPSULE | Freq: Every day | ORAL | Status: DC

## 2016-01-28 NOTE — Patient Instructions (Signed)
Your physician has recommended you make the following change in your medication:  1)Stop Amlodipine 2)Start Cardizem 180mg  once a day

## 2016-01-28 NOTE — Progress Notes (Signed)
Patient ID: Logan West, male   DOB: 1934-10-26, 82 y.o.   MRN: [id number]     Primary Care Physician: Mechele ClaudeSTACKS,WARREN, MD Referring Physician: Mylinda LatinaNorthline triage Cardiologist: Dr. Farris HasAllred   Logan West is a 82 y.o. male with a h/o chronic afib that is in the afib clinic today for evaluation for rvr, up to 140 bpm, noted by home health. He was seen by Dr. Antoine PocheHochrein last fall and diltiazem was up titrated to 360 mg daily and was rate controlled. However, he was hospitalized in January with pneumonia and was intubated. He went to a nursing home x 3 weeks afterward and back home now. While in the hospital, diltiazem was stopped and pt was placed back on amlodipine. He saw his pcp this week and amlodipine was decreased to 5 mg and lasix to 20 mg qod for some soft BP's. BP today 118/76. When he was seen by Dr. Antoine PocheHochrein in February, he was thought to be rate controlled. Pt was on eliquis at one time, had some hematuria, but reslolved. Pt is adamant that he does not want to go back on blood thinners although he understands his risk of stroke. He does have some intermittent chest pain but does not sound exertional more likely MS. He has been offered a L/R heart cath by Dr. Antoine PocheHochrein but he has refused in the past.   Today, he denies symptoms of palpitations, chest pain, shortness of breath, orthopnea, PND, lower extremity edema, dizziness, presyncope, syncope, or neurologic sequela. The patient is tolerating medications without difficulties and is otherwise without complaint today.   Past Medical History  Diagnosis Date  . Hypertension     x30 years  . Hyperlipidemia     x 7-8  . COPD (chronic obstructive pulmonary disease) (HCC)   . Obesity   . Chronic pain   . CHF (congestive heart failure) (HCC)   . Atrial fibrillation [facility](HCC)    Past Surgical History  Procedure Laterality Date  . Percutaneous pinning      right forearm fracture  . Fracture surgery      right forearm    Current Outpatient  Prescriptions  Medication Sig Dispense Refill  . albuterol (PROVENTIL HFA;VENTOLIN HFA) 108 (90 BASE) MCG/ACT inhaler Inhale 2 puffs into the lungs every 6 (six) hours as needed for wheezing or shortness of breath.    Marland Kitchen. aspirin 81 MG EC tablet Take 81 mg by mouth daily.  0  . budesonide-formoterol (SYMBICORT) 160-4.5 MCG/ACT inhaler Inhale 2 puffs into the lungs 2 (two) times daily. 1 Inhaler 5  . furosemide (LASIX) 20 MG tablet Take 1 tablet (20 mg total) by mouth every other day. 30 tablet 11  . guaiFENesin (MUCINEX) 600 MG 12 hr tablet Take 2 tablets (1,200 mg total) by mouth 2 (two) times daily. 20 tablet 1  . LORazepam (ATIVAN) 1 MG tablet Take 1 tablet (1 mg total) by mouth every 8 (eight) hours as needed for anxiety. 60 tablet 5  . metoprolol (LOPRESSOR) 50 MG tablet TAKE 1 TABLET (50 MG TOTAL) BY MOUTH DAILY. 30 tablet 5  . pravastatin (PRAVACHOL) 40 MG tablet TAKE 1 TABLET (40 MG TOTAL) BY MOUTH DAILY. 30 tablet 3  . tamsulosin (FLOMAX) 0.4 MG CAPS capsule Take 1 capsule by mouth daily.    . traMADol (ULTRAM) 50 MG tablet TAKE 1 TABLET EVERY 8 HOURS AS NEEDED FOR MODERATE PAIN  5  . diltiazem (CARDIZEM CD) 180 MG 24 hr capsule Take 1 capsule (180 mg total) by mouth  daily. 30 capsule 1   No current facility-administered medications for this encounter.    No Known Allergies  Social History   Social History  . Marital Status: Single    Spouse Name: N/A  . Number of Children: N/A  . Years of Education: N/A   Occupational History  . Not on file.   Social History Main Topics  . Smoking status: Former Smoker -- 2.50 packs/day for 25 years    Types: Cigarettes    Quit date: 02/23/1991  . Smokeless tobacco: Never Used  . Alcohol Use: Not on file  . Drug Use: Not on file  . Sexual Activity: Not on file   Other Topics Concern  . Not on file   Social History Narrative    Family History  Problem Relation Age of Onset  . Cancer Mother   . Asthma Father   . Heart attack  Brother     same mother.  MI in his 6s  . Coronary artery disease Other     ROS- All systems are reviewed and negative except as per the HPI above  Physical Exam: Filed Vitals:   01/28/16 0903  BP: 118/76  Pulse: 108  Height:  (1.727 m)  Weight: 213 lb 12.8 oz (96.979 kg)    GEN- The patient is chronically ill appearing, alert and oriented x 3 today.  Wearing O2 per [place] Head- normocephalic, atraumatic Eyes-  Sclera clear, conjunctiva pink Ears- hearing intact Oropharynx- clear Neck- supple, no JVP Lymph- no cervical lymphadenopathy Lungs- Clear to ausculation bilaterally, normal work of breathing Heart-Irregular rate and rhythm, no murmurs, rubs or gallops, PMI not laterally displaced GI- soft, NT, ND, + BS Extremities- no clubbing, cyanosis, or edema MS- no significant deformity or atrophy Skin- no rash or lesion Psych- euthymic mood, full affect Neuro- strength and sensation are intact  EKG- afib with rvr at 108 bpm qrs int 66 ms, qtc 375 ms Epic records reviewed  Assessment and Plan: 1. Chronic afib Will stop amlodipine and restart cardizem at 180 mg qd in addition to metoprolol 50 mg qd for better rate control  2. Chadsvasc score of at least 4 Refuses anticoagulation, understands risk of stroke  3. LLE/CHF No LLE edema noted today Weighting daily Limiting salt HH nurse in home  lasix 20 mg is now every other day  F/u in one week  Lupita Leash C. [facility] [address] [place], [place] [address] [phone]

## 2016-01-31 DIAGNOSIS — J449 Chronic obstructive pulmonary disease, unspecified: Secondary | ICD-10-CM | POA: Diagnosis not present

## 2016-02-01 ENCOUNTER — Ambulatory Visit (HOSPITAL_COMMUNITY)
Admission: RE | Admit: 2016-02-01 | Discharge: 2016-02-01 | Disposition: A | Discharge status: Home / Self Care | Payer: Medicare Other | Source: Ambulatory Visit | Attending: Nurse Practitioner | Admitting: Nurse Practitioner

## 2016-02-01 ENCOUNTER — Encounter (HOSPITAL_COMMUNITY): Payer: Self-pay | Admitting: Nurse Practitioner

## 2016-02-01 VITALS — BP 118/62 | HR 67 | Ht 68.0 in | Wt 222.0 lb

## 2016-02-01 DIAGNOSIS — Z6833 Body mass index (BMI) 33.0-33.9, adult: Secondary | ICD-10-CM | POA: Insufficient documentation

## 2016-02-01 DIAGNOSIS — Z8249 Family history of ischemic heart disease and other diseases of the circulatory system: Secondary | ICD-10-CM | POA: Diagnosis not present

## 2016-02-01 DIAGNOSIS — E785 Hyperlipidemia, unspecified: Secondary | ICD-10-CM | POA: Insufficient documentation

## 2016-02-01 DIAGNOSIS — Z825 Family history of asthma and other chronic lower respiratory diseases: Secondary | ICD-10-CM | POA: Diagnosis not present

## 2016-02-01 DIAGNOSIS — Z79899 Other long term (current) drug therapy: Secondary | ICD-10-CM | POA: Insufficient documentation

## 2016-02-01 DIAGNOSIS — I509 Heart failure, unspecified: Secondary | ICD-10-CM | POA: Insufficient documentation

## 2016-02-01 DIAGNOSIS — I482 Chronic atrial fibrillation, unspecified: Secondary | ICD-10-CM

## 2016-02-01 DIAGNOSIS — Z7982 Long term (current) use of aspirin: Secondary | ICD-10-CM | POA: Insufficient documentation

## 2016-02-01 DIAGNOSIS — J449 Chronic obstructive pulmonary disease, unspecified: Secondary | ICD-10-CM | POA: Insufficient documentation

## 2016-02-01 DIAGNOSIS — I110 Hypertensive heart disease with heart failure: Secondary | ICD-10-CM | POA: Insufficient documentation

## 2016-02-01 DIAGNOSIS — E669 Obesity, unspecified: Secondary | ICD-10-CM | POA: Diagnosis not present

## 2016-02-01 DIAGNOSIS — G8929 Other chronic pain: Secondary | ICD-10-CM | POA: Diagnosis not present

## 2016-02-01 DIAGNOSIS — Z87891 Personal history of nicotine dependence: Secondary | ICD-10-CM | POA: Diagnosis not present

## 2016-02-01 DIAGNOSIS — I4891 Unspecified atrial fibrillation: Secondary | ICD-10-CM | POA: Diagnosis present

## 2016-02-01 NOTE — Progress Notes (Signed)
Patient ID: Logan West, male   DOB: [birth year], 82 y.o.   MRN: [id number]     Primary Care Physician: Mechele Claude, MD Referring Physician: Mylinda Latina triage Cardiologist: Dr. Farris Has is a 82 y.o. male with a h/o chronic afib that is in the afib clinic today for evaluation for rvr, up to 140 bpm, noted by home health. He was seen by Dr. Antoine Poche last fall and diltiazem was up titrated to 360 mg daily and was rate controlled. However, he was hospitalized in January with pneumonia and was intubated. He went to a nursing home x 3 weeks afterward and back home now. While in the hospital, diltiazem was stopped and pt was placed back on amlodipine. He saw his pcp this week and amlodipine was decreased to 5 mg and lasix to 20 mg qod for some soft BP's. BP today 118/76. When he was seen by Dr. Antoine Poche in February, he was thought to be rate controlled. Pt was on eliquis at one time, had some hematuria, but reslolved. Pt is adamant that he does not want to go back on blood thinners although he understands his risk of stroke. He does have some intermittent chest pain but does not sound exertional more likely MS. He has been offered a L/R heart cath by Dr. Antoine Poche but he has refused in the past.   He returns 3/13 and feels much better, HR now controlled in the 60's on diltiazem.Marland Kitchen He could walk the distance to clinic where on last appointment had to be in a w/c because of shortness of breath. His weight is up from 219 to 222 but the pt denies fluid, no LLE, states he is eating better, he had a very poor appetite  for the last few weeks.  Today, he denies symptoms of palpitations, chest pain, shortness of breath, orthopnea, PND, lower extremity edema, dizziness, presyncope, syncope, or neurologic sequela. The patient is tolerating medications without difficulties and is otherwise without complaint today.   Past Medical History  Diagnosis Date  . Hypertension     x30 years  . Hyperlipidemia    x 7-8  . COPD (chronic obstructive pulmonary disease) (HCC)   . Obesity   . Chronic pain   . CHF (congestive heart failure) (HCC)   . Atrial fibrillation [facility])    Past Surgical History  Procedure Laterality Date  . Percutaneous pinning      right forearm fracture  . Fracture surgery      right forearm    Current Outpatient Prescriptions  Medication Sig Dispense Refill  . albuterol (PROVENTIL HFA;VENTOLIN HFA) 108 (90 BASE) MCG/ACT inhaler Inhale 2 puffs into the lungs every 6 (six) hours as needed for wheezing or shortness of breath.    Marland Kitchen aspirin 81 MG EC tablet Take 81 mg by mouth daily.  0  . budesonide-formoterol (SYMBICORT) 160-4.5 MCG/ACT inhaler Inhale 2 puffs into the lungs 2 (two) times daily. 1 Inhaler 5  . diltiazem (CARDIZEM CD) 180 MG 24 hr capsule Take 1 capsule (180 mg total) by mouth daily. 30 capsule 1  . furosemide (LASIX) 20 MG tablet Take 1 tablet (20 mg total) by mouth every other day. 30 tablet 11  . guaiFENesin (MUCINEX) 600 MG 12 hr tablet Take 2 tablets (1,200 mg total) by mouth 2 (two) times daily. 20 tablet 1  . LORazepam (ATIVAN) 1 MG tablet Take 1 tablet (1 mg total) by mouth every 8 (eight) hours as needed for anxiety. 60 tablet 5  .  metoprolol (LOPRESSOR) 50 MG tablet TAKE 1 TABLET (50 MG TOTAL) BY MOUTH DAILY. 30 tablet 5  . pravastatin (PRAVACHOL) 40 MG tablet TAKE 1 TABLET (40 MG TOTAL) BY MOUTH DAILY. 30 tablet 3  . tamsulosin (FLOMAX) 0.4 MG CAPS capsule Take 1 capsule by mouth daily.    . traMADol (ULTRAM) 50 MG tablet TAKE 1 TABLET EVERY 8 HOURS AS NEEDED FOR MODERATE PAIN  5   No current facility-administered medications for this encounter.    No Known Allergies  Social History   Social History  . Marital Status: Single    Spouse Name: N/A  . Number of Children: N/A  . Years of Education: N/A   Occupational History  . Not on file.   Social History Main Topics  . Smoking status: Former Smoker -- 2.50 packs/day for 25 years    Types:  Cigarettes    Quit date: 02/23/1991  . Smokeless tobacco: Never Used  . Alcohol Use: Not on file  . Drug Use: Not on file  . Sexual Activity: Not on file   Other Topics Concern  . Not on file   Social History Narrative    Family History  Problem Relation Age of Onset  . Cancer Mother   . Asthma Father   . Heart attack Brother     same mother.  MI in his 720s  . Coronary artery disease Other     ROS- All systems are reviewed and negative except as per the HPI above  Physical Exam: Filed Vitals:   02/01/16 1128  BP: 118/62  Pulse: 67  Height: 5\' 8"  (1.727 m)  Weight: 222 lb (100.699 kg)    GEN- The patient is chronically ill appearing, alert and oriented x 3 today.  Wearing O2 per [place] Head- normocephalic, atraumatic Eyes-  Sclera clear, conjunctiva pink Ears- hearing intact Oropharynx- clear Neck- supple, no JVP Lymph- no cervical lymphadenopathy Lungs- Clear to ausculation bilaterally, normal work of breathing Heart-Irregular rate and rhythm, no murmurs, rubs or gallops, PMI not laterally displaced GI- soft, NT, ND, + BS Extremities- no clubbing, cyanosis, or edema MS- no significant deformity or atrophy Skin- no rash or lesion Psych- euthymic mood, full affect Neuro- strength and sensation are intact  EKG- afib with rvr at 67 bpm qrs int 66 ms, qtc 431 ms Epic records reviewed  Assessment and Plan: 1. Chronic afib Much better rate controlled on diltizaem 180 mg qd, feels much improved.   2. Chadsvasc score of at least 4 Refuses anticoagulation, understands risk of stroke  3. LLE/CHF No LLE edema noted today Weighting daily, has seen some increase of weight since last week but appetite is much improved since heart rate better controlled Limiting salt HH nurse in home  lasix 20 mg is now every other day per pcp  4. HTN Stable off amlodipine and on diltiazem  F/u with Dr. Antoine PocheHochrein as scheduled afib clinic as needed  Elvina SidleDonna C. Matthew Folksarroll, ANP-C Afib  Clinic [facility] [address] [place], [place] [address] [phone]

## 2016-02-02 DIAGNOSIS — J189 Pneumonia, unspecified organism: Secondary | ICD-10-CM | POA: Diagnosis not present

## 2016-02-02 DIAGNOSIS — N401 Enlarged prostate with lower urinary tract symptoms: Secondary | ICD-10-CM | POA: Diagnosis not present

## 2016-02-02 DIAGNOSIS — I5042 Chronic combined systolic (congestive) and diastolic (congestive) heart failure: Secondary | ICD-10-CM | POA: Diagnosis not present

## 2016-02-02 DIAGNOSIS — E785 Hyperlipidemia, unspecified: Secondary | ICD-10-CM | POA: Diagnosis not present

## 2016-02-02 DIAGNOSIS — G894 Chronic pain syndrome: Secondary | ICD-10-CM | POA: Diagnosis not present

## 2016-02-02 DIAGNOSIS — N138 Other obstructive and reflux uropathy: Secondary | ICD-10-CM | POA: Diagnosis not present

## 2016-02-02 DIAGNOSIS — I2510 Atherosclerotic heart disease of native coronary artery without angina pectoris: Secondary | ICD-10-CM | POA: Diagnosis not present

## 2016-02-02 DIAGNOSIS — I482 Chronic atrial fibrillation: Secondary | ICD-10-CM | POA: Diagnosis not present

## 2016-02-02 DIAGNOSIS — I110 Hypertensive heart disease with heart failure: Secondary | ICD-10-CM | POA: Diagnosis not present

## 2016-02-02 DIAGNOSIS — J440 Chronic obstructive pulmonary disease with acute lower respiratory infection: Secondary | ICD-10-CM | POA: Diagnosis not present

## 2016-02-03 ENCOUNTER — Telehealth: Payer: Self-pay | Admitting: Family Medicine

## 2016-02-03 DIAGNOSIS — I110 Hypertensive heart disease with heart failure: Secondary | ICD-10-CM | POA: Diagnosis not present

## 2016-02-03 DIAGNOSIS — I5042 Chronic combined systolic (congestive) and diastolic (congestive) heart failure: Secondary | ICD-10-CM | POA: Diagnosis not present

## 2016-02-03 DIAGNOSIS — G894 Chronic pain syndrome: Secondary | ICD-10-CM | POA: Diagnosis not present

## 2016-02-03 DIAGNOSIS — J440 Chronic obstructive pulmonary disease with acute lower respiratory infection: Secondary | ICD-10-CM | POA: Diagnosis not present

## 2016-02-03 DIAGNOSIS — I2510 Atherosclerotic heart disease of native coronary artery without angina pectoris: Secondary | ICD-10-CM | POA: Diagnosis not present

## 2016-02-03 DIAGNOSIS — N138 Other obstructive and reflux uropathy: Secondary | ICD-10-CM | POA: Diagnosis not present

## 2016-02-03 DIAGNOSIS — I482 Chronic atrial fibrillation: Secondary | ICD-10-CM | POA: Diagnosis not present

## 2016-02-03 DIAGNOSIS — J189 Pneumonia, unspecified organism: Secondary | ICD-10-CM | POA: Diagnosis not present

## 2016-02-03 DIAGNOSIS — N401 Enlarged prostate with lower urinary tract symptoms: Secondary | ICD-10-CM | POA: Diagnosis not present

## 2016-02-03 DIAGNOSIS — E785 Hyperlipidemia, unspecified: Secondary | ICD-10-CM | POA: Diagnosis not present

## 2016-02-04 ENCOUNTER — Other Ambulatory Visit: Payer: Self-pay | Admitting: *Deleted

## 2016-02-04 ENCOUNTER — Other Ambulatory Visit: Payer: Self-pay

## 2016-02-04 DIAGNOSIS — J449 Chronic obstructive pulmonary disease, unspecified: Secondary | ICD-10-CM

## 2016-02-04 DIAGNOSIS — I10 Essential (primary) hypertension: Secondary | ICD-10-CM

## 2016-02-04 MED ORDER — BUDESONIDE-FORMOTEROL FUMARATE 160-4.5 MCG/ACT IN AERO: 2.0000 | INHALATION_SPRAY | Freq: Two times a day (BID) | RESPIRATORY_TRACT | Status: DC

## 2016-02-04 NOTE — Telephone Encounter (Signed)
done

## 2016-02-08 ENCOUNTER — Other Ambulatory Visit: Payer: Self-pay

## 2016-02-08 NOTE — Patient Outreach (Signed)
[facility] Care Management  02/08/2016  Meredith StaggersHorace Pyka [birth year] [id number]   Referral Date: 02-02-16 [facility] Reason: CHF High Risk Outreach Attempt: Reached on First Attempt  Social: Patient lives with spouse and family.  Daughter is caregiver and lives in the home. Patient is independent with activities of daily living.   Conditions: Patient admits to heart failure and COPD. [facility] Patient weighs daily.  Patient recently saw heart specialist and visit went well.  Patient reports that his COPD is controlled and states he knows when to notify physician. Patient currently has [facility] in for home health.    Medications: Patient reports he takes his medications as prescribed and is able to afford medications.     Outcome: Patient declined services at this time as he states he has home health and is doing well.   Plan: RN Health Coach will send letter and brochure for future reference.   RN Health Coach will notify primary doctor of refusal. RN Health Coach will notify care management assistance of case closure.    Bary Lericheionne J Chantel Teti, RN, MSN [facility] Care Management RN Telephonic Health Coach [phone]

## 2016-02-10 DIAGNOSIS — I110 Hypertensive heart disease with heart failure: Secondary | ICD-10-CM | POA: Diagnosis not present

## 2016-02-10 DIAGNOSIS — J440 Chronic obstructive pulmonary disease with acute lower respiratory infection: Secondary | ICD-10-CM | POA: Diagnosis not present

## 2016-02-10 DIAGNOSIS — I2510 Atherosclerotic heart disease of native coronary artery without angina pectoris: Secondary | ICD-10-CM | POA: Diagnosis not present

## 2016-02-10 DIAGNOSIS — I5042 Chronic combined systolic (congestive) and diastolic (congestive) heart failure: Secondary | ICD-10-CM | POA: Diagnosis not present

## 2016-02-10 DIAGNOSIS — G894 Chronic pain syndrome: Secondary | ICD-10-CM | POA: Diagnosis not present

## 2016-02-10 DIAGNOSIS — N401 Enlarged prostate with lower urinary tract symptoms: Secondary | ICD-10-CM | POA: Diagnosis not present

## 2016-02-10 DIAGNOSIS — I482 Chronic atrial fibrillation: Secondary | ICD-10-CM | POA: Diagnosis not present

## 2016-02-10 DIAGNOSIS — J189 Pneumonia, unspecified organism: Secondary | ICD-10-CM | POA: Diagnosis not present

## 2016-02-10 DIAGNOSIS — N138 Other obstructive and reflux uropathy: Secondary | ICD-10-CM | POA: Diagnosis not present

## 2016-02-10 DIAGNOSIS — E785 Hyperlipidemia, unspecified: Secondary | ICD-10-CM | POA: Diagnosis not present

## 2016-02-16 DIAGNOSIS — J449 Chronic obstructive pulmonary disease, unspecified: Secondary | ICD-10-CM | POA: Diagnosis not present

## 2016-02-17 DIAGNOSIS — J440 Chronic obstructive pulmonary disease with acute lower respiratory infection: Secondary | ICD-10-CM | POA: Diagnosis not present

## 2016-02-17 DIAGNOSIS — N138 Other obstructive and reflux uropathy: Secondary | ICD-10-CM | POA: Diagnosis not present

## 2016-02-17 DIAGNOSIS — J189 Pneumonia, unspecified organism: Secondary | ICD-10-CM | POA: Diagnosis not present

## 2016-02-17 DIAGNOSIS — G894 Chronic pain syndrome: Secondary | ICD-10-CM | POA: Diagnosis not present

## 2016-02-17 DIAGNOSIS — I5042 Chronic combined systolic (congestive) and diastolic (congestive) heart failure: Secondary | ICD-10-CM | POA: Diagnosis not present

## 2016-02-17 DIAGNOSIS — E785 Hyperlipidemia, unspecified: Secondary | ICD-10-CM | POA: Diagnosis not present

## 2016-02-17 DIAGNOSIS — N401 Enlarged prostate with lower urinary tract symptoms: Secondary | ICD-10-CM | POA: Diagnosis not present

## 2016-02-17 DIAGNOSIS — I2510 Atherosclerotic heart disease of native coronary artery without angina pectoris: Secondary | ICD-10-CM | POA: Diagnosis not present

## 2016-02-17 DIAGNOSIS — I110 Hypertensive heart disease with heart failure: Secondary | ICD-10-CM | POA: Diagnosis not present

## 2016-02-17 DIAGNOSIS — I482 Chronic atrial fibrillation: Secondary | ICD-10-CM | POA: Diagnosis not present

## 2016-02-19 ENCOUNTER — Ambulatory Visit (INDEPENDENT_AMBULATORY_CARE_PROVIDER_SITE_OTHER): Payer: Medicare Other | Admitting: Family Medicine

## 2016-02-20 ENCOUNTER — Other Ambulatory Visit: Payer: Self-pay | Admitting: Family Medicine

## 2016-02-22 NOTE — Telephone Encounter (Signed)
Last filled 01/19/16, last seen 01/08/16. Call in at [facility]

## 2016-02-22 NOTE — Telephone Encounter (Signed)
Please review and advise.

## 2016-02-22 NOTE — Telephone Encounter (Signed)
rx called into pharmacy

## 2016-02-25 DIAGNOSIS — J440 Chronic obstructive pulmonary disease with acute lower respiratory infection: Secondary | ICD-10-CM | POA: Diagnosis not present

## 2016-02-25 DIAGNOSIS — I5042 Chronic combined systolic (congestive) and diastolic (congestive) heart failure: Secondary | ICD-10-CM | POA: Diagnosis not present

## 2016-02-25 DIAGNOSIS — N401 Enlarged prostate with lower urinary tract symptoms: Secondary | ICD-10-CM | POA: Diagnosis not present

## 2016-02-25 DIAGNOSIS — G894 Chronic pain syndrome: Secondary | ICD-10-CM | POA: Diagnosis not present

## 2016-02-25 DIAGNOSIS — I110 Hypertensive heart disease with heart failure: Secondary | ICD-10-CM | POA: Diagnosis not present

## 2016-02-25 DIAGNOSIS — N138 Other obstructive and reflux uropathy: Secondary | ICD-10-CM | POA: Diagnosis not present

## 2016-02-25 DIAGNOSIS — I482 Chronic atrial fibrillation: Secondary | ICD-10-CM | POA: Diagnosis not present

## 2016-02-25 DIAGNOSIS — J189 Pneumonia, unspecified organism: Secondary | ICD-10-CM | POA: Diagnosis not present

## 2016-02-25 DIAGNOSIS — E785 Hyperlipidemia, unspecified: Secondary | ICD-10-CM | POA: Diagnosis not present

## 2016-02-25 DIAGNOSIS — I2510 Atherosclerotic heart disease of native coronary artery without angina pectoris: Secondary | ICD-10-CM | POA: Diagnosis not present

## 2016-03-02 DIAGNOSIS — I2510 Atherosclerotic heart disease of native coronary artery without angina pectoris: Secondary | ICD-10-CM | POA: Diagnosis not present

## 2016-03-02 DIAGNOSIS — G894 Chronic pain syndrome: Secondary | ICD-10-CM | POA: Diagnosis not present

## 2016-03-02 DIAGNOSIS — Z87891 Personal history of nicotine dependence: Secondary | ICD-10-CM | POA: Diagnosis not present

## 2016-03-02 DIAGNOSIS — Z9981 Dependence on supplemental oxygen: Secondary | ICD-10-CM | POA: Diagnosis not present

## 2016-03-02 DIAGNOSIS — J449 Chronic obstructive pulmonary disease, unspecified: Secondary | ICD-10-CM | POA: Diagnosis not present

## 2016-03-02 DIAGNOSIS — E785 Hyperlipidemia, unspecified: Secondary | ICD-10-CM | POA: Diagnosis not present

## 2016-03-02 DIAGNOSIS — I5042 Chronic combined systolic (congestive) and diastolic (congestive) heart failure: Secondary | ICD-10-CM | POA: Diagnosis not present

## 2016-03-02 DIAGNOSIS — I482 Chronic atrial fibrillation: Secondary | ICD-10-CM | POA: Diagnosis not present

## 2016-03-02 DIAGNOSIS — I110 Hypertensive heart disease with heart failure: Secondary | ICD-10-CM | POA: Diagnosis not present

## 2016-03-09 DIAGNOSIS — E785 Hyperlipidemia, unspecified: Secondary | ICD-10-CM | POA: Diagnosis not present

## 2016-03-09 DIAGNOSIS — I110 Hypertensive heart disease with heart failure: Secondary | ICD-10-CM | POA: Diagnosis not present

## 2016-03-09 DIAGNOSIS — I5042 Chronic combined systolic (congestive) and diastolic (congestive) heart failure: Secondary | ICD-10-CM | POA: Diagnosis not present

## 2016-03-09 DIAGNOSIS — I482 Chronic atrial fibrillation: Secondary | ICD-10-CM | POA: Diagnosis not present

## 2016-03-09 DIAGNOSIS — Z87891 Personal history of nicotine dependence: Secondary | ICD-10-CM | POA: Diagnosis not present

## 2016-03-09 DIAGNOSIS — G894 Chronic pain syndrome: Secondary | ICD-10-CM | POA: Diagnosis not present

## 2016-03-09 DIAGNOSIS — I2510 Atherosclerotic heart disease of native coronary artery without angina pectoris: Secondary | ICD-10-CM | POA: Diagnosis not present

## 2016-03-09 DIAGNOSIS — J449 Chronic obstructive pulmonary disease, unspecified: Secondary | ICD-10-CM | POA: Diagnosis not present

## 2016-03-09 DIAGNOSIS — Z9981 Dependence on supplemental oxygen: Secondary | ICD-10-CM | POA: Diagnosis not present

## 2016-03-16 DIAGNOSIS — J449 Chronic obstructive pulmonary disease, unspecified: Secondary | ICD-10-CM | POA: Diagnosis not present

## 2016-03-16 DIAGNOSIS — I2510 Atherosclerotic heart disease of native coronary artery without angina pectoris: Secondary | ICD-10-CM | POA: Diagnosis not present

## 2016-03-16 DIAGNOSIS — I482 Chronic atrial fibrillation: Secondary | ICD-10-CM | POA: Diagnosis not present

## 2016-03-16 DIAGNOSIS — I110 Hypertensive heart disease with heart failure: Secondary | ICD-10-CM | POA: Diagnosis not present

## 2016-03-16 DIAGNOSIS — G894 Chronic pain syndrome: Secondary | ICD-10-CM | POA: Diagnosis not present

## 2016-03-16 DIAGNOSIS — I5042 Chronic combined systolic (congestive) and diastolic (congestive) heart failure: Secondary | ICD-10-CM | POA: Diagnosis not present

## 2016-03-16 DIAGNOSIS — Z87891 Personal history of nicotine dependence: Secondary | ICD-10-CM | POA: Diagnosis not present

## 2016-03-16 DIAGNOSIS — E785 Hyperlipidemia, unspecified: Secondary | ICD-10-CM | POA: Diagnosis not present

## 2016-03-16 DIAGNOSIS — Z9981 Dependence on supplemental oxygen: Secondary | ICD-10-CM | POA: Diagnosis not present

## 2016-03-18 DIAGNOSIS — J449 Chronic obstructive pulmonary disease, unspecified: Secondary | ICD-10-CM | POA: Diagnosis not present

## 2016-03-20 ENCOUNTER — Ambulatory Visit (INDEPENDENT_AMBULATORY_CARE_PROVIDER_SITE_OTHER): Payer: Medicare Other | Admitting: Family Medicine

## 2016-03-20 DIAGNOSIS — J449 Chronic obstructive pulmonary disease, unspecified: Secondary | ICD-10-CM | POA: Diagnosis not present

## 2016-03-20 DIAGNOSIS — I2510 Atherosclerotic heart disease of native coronary artery without angina pectoris: Secondary | ICD-10-CM

## 2016-03-20 DIAGNOSIS — E785 Hyperlipidemia, unspecified: Secondary | ICD-10-CM | POA: Diagnosis not present

## 2016-03-20 DIAGNOSIS — I5042 Chronic combined systolic (congestive) and diastolic (congestive) heart failure: Secondary | ICD-10-CM

## 2016-03-20 DIAGNOSIS — I482 Chronic atrial fibrillation: Secondary | ICD-10-CM

## 2016-03-20 DIAGNOSIS — I110 Hypertensive heart disease with heart failure: Secondary | ICD-10-CM

## 2016-03-21 ENCOUNTER — Other Ambulatory Visit (HOSPITAL_COMMUNITY): Payer: Self-pay | Admitting: Nurse Practitioner

## 2016-03-21 ENCOUNTER — Other Ambulatory Visit: Payer: Self-pay | Admitting: Family Medicine

## 2016-03-21 NOTE — Telephone Encounter (Signed)
Last seen 01/27/16 Dr Hyacinth MeekerMiller   Dr Darlyn ReadStacks  PCP  If approved route to nurse to call into [facility]

## 2016-03-21 NOTE — Telephone Encounter (Signed)
Please review and advise.

## 2016-03-21 NOTE — Telephone Encounter (Signed)
Notified pt we can not refill until appt Verbalizes understanding

## 2016-03-23 DIAGNOSIS — E785 Hyperlipidemia, unspecified: Secondary | ICD-10-CM | POA: Diagnosis not present

## 2016-03-23 DIAGNOSIS — Z9981 Dependence on supplemental oxygen: Secondary | ICD-10-CM | POA: Diagnosis not present

## 2016-03-23 DIAGNOSIS — Z87891 Personal history of nicotine dependence: Secondary | ICD-10-CM | POA: Diagnosis not present

## 2016-03-23 DIAGNOSIS — I110 Hypertensive heart disease with heart failure: Secondary | ICD-10-CM | POA: Diagnosis not present

## 2016-03-23 DIAGNOSIS — J449 Chronic obstructive pulmonary disease, unspecified: Secondary | ICD-10-CM | POA: Diagnosis not present

## 2016-03-23 DIAGNOSIS — I5042 Chronic combined systolic (congestive) and diastolic (congestive) heart failure: Secondary | ICD-10-CM | POA: Diagnosis not present

## 2016-03-23 DIAGNOSIS — I2510 Atherosclerotic heart disease of native coronary artery without angina pectoris: Secondary | ICD-10-CM | POA: Diagnosis not present

## 2016-03-23 DIAGNOSIS — I482 Chronic atrial fibrillation: Secondary | ICD-10-CM | POA: Diagnosis not present

## 2016-03-23 DIAGNOSIS — G894 Chronic pain syndrome: Secondary | ICD-10-CM | POA: Diagnosis not present

## 2016-03-30 DIAGNOSIS — J449 Chronic obstructive pulmonary disease, unspecified: Secondary | ICD-10-CM | POA: Diagnosis not present

## 2016-03-30 DIAGNOSIS — I2510 Atherosclerotic heart disease of native coronary artery without angina pectoris: Secondary | ICD-10-CM | POA: Diagnosis not present

## 2016-03-30 DIAGNOSIS — Z87891 Personal history of nicotine dependence: Secondary | ICD-10-CM | POA: Diagnosis not present

## 2016-03-30 DIAGNOSIS — E785 Hyperlipidemia, unspecified: Secondary | ICD-10-CM | POA: Diagnosis not present

## 2016-03-30 DIAGNOSIS — I110 Hypertensive heart disease with heart failure: Secondary | ICD-10-CM | POA: Diagnosis not present

## 2016-03-30 DIAGNOSIS — I482 Chronic atrial fibrillation: Secondary | ICD-10-CM | POA: Diagnosis not present

## 2016-03-30 DIAGNOSIS — I5042 Chronic combined systolic (congestive) and diastolic (congestive) heart failure: Secondary | ICD-10-CM | POA: Diagnosis not present

## 2016-03-30 DIAGNOSIS — Z9981 Dependence on supplemental oxygen: Secondary | ICD-10-CM | POA: Diagnosis not present

## 2016-03-30 DIAGNOSIS — G894 Chronic pain syndrome: Secondary | ICD-10-CM | POA: Diagnosis not present

## 2016-04-01 DIAGNOSIS — J449 Chronic obstructive pulmonary disease, unspecified: Secondary | ICD-10-CM | POA: Diagnosis not present

## 2016-04-07 ENCOUNTER — Other Ambulatory Visit: Payer: Self-pay | Admitting: *Deleted

## 2016-04-07 ENCOUNTER — Ambulatory Visit (INDEPENDENT_AMBULATORY_CARE_PROVIDER_SITE_OTHER): Payer: Medicare Other | Admitting: Family Medicine

## 2016-04-07 ENCOUNTER — Encounter: Payer: Self-pay | Admitting: Family Medicine

## 2016-04-07 VITALS — BP 133/67 | HR 83 | Temp 97.1°F | Ht 68.0 in | Wt 214.0 lb

## 2016-04-07 DIAGNOSIS — I482 Chronic atrial fibrillation, unspecified: Secondary | ICD-10-CM

## 2016-04-07 DIAGNOSIS — I10 Essential (primary) hypertension: Secondary | ICD-10-CM | POA: Diagnosis not present

## 2016-04-07 DIAGNOSIS — F419 Anxiety disorder, unspecified: Secondary | ICD-10-CM

## 2016-04-07 DIAGNOSIS — J449 Chronic obstructive pulmonary disease, unspecified: Secondary | ICD-10-CM | POA: Diagnosis not present

## 2016-04-07 DIAGNOSIS — E876 Hypokalemia: Secondary | ICD-10-CM | POA: Diagnosis not present

## 2016-04-07 DIAGNOSIS — E785 Hyperlipidemia, unspecified: Secondary | ICD-10-CM

## 2016-04-07 DIAGNOSIS — J9611 Chronic respiratory failure with hypoxia: Secondary | ICD-10-CM

## 2016-04-07 DIAGNOSIS — R7303 Prediabetes: Secondary | ICD-10-CM | POA: Insufficient documentation

## 2016-04-07 LAB — BAYER DCA HB A1C WAIVED: HB A1C: 5.1 % (ref <7.0)

## 2016-04-07 MED ORDER — METOPROLOL TARTRATE 50 MG PO TABS: ORAL_TABLET | ORAL | Status: DC

## 2016-04-07 MED ORDER — LORAZEPAM 1 MG PO TABS: 1.0000 mg | ORAL_TABLET | Freq: Three times a day (TID) | ORAL | Status: DC | PRN

## 2016-04-07 MED ORDER — TRAMADOL HCL 50 MG PO TABS: ORAL_TABLET | ORAL | Status: DC

## 2016-04-07 NOTE — Progress Notes (Signed)
Subjective:  Patient ID: Logan West, male    DOB: [birth year]  Age: 82 y.o. MRN: [id number]  CC: Hypertension; COPD; Atrial Fibrillation; GAD; and Joint Pain   HPI Toshiro Hanken presents for  follow-up of hypertension. Patient has no history of headache chest pain or shortness of breath or recent cough. Patient also denies symptoms of TIA such as numbness weakness lateralizing. Patient checks  blood pressure at home and has not had any elevated readings recently. Patient denies side effects from his medication. States taking it regularly.  Patient also  in for follow-up of elevated cholesterol. Doing well without complaints on current medication. Denies side effects of statin including myalgia and arthralgia and nausea. Also in today for liver function testing. Currently no chest pain, shortness of breath or other cardiovascular related symptoms noted.  Patient in for follow-up of atrial fibrillation. Patient denies any recent bouts of chest pain or palpitations. Additionally, patient is no longer taking anticoagulants. Patient denies any recent excessive bleeding episodes including epistaxis, bleeding from the gums, genitalia, rectal bleeding or hematuria. Additionally there has been no excessive bruising.  Pt. Dyspneic on arrival. )2 was 74%. His tank was turn ed off accidentally. Turned it on and distress resolved. O2 sat went to 92.  GAD 7 : Generalized Anxiety Score 04/07/2016 09/21/2015  Nervous, Anxious, on Edge 3 2  Control/stop worrying 3 2  Worry too much - different things 3 2  Trouble relaxing 3 0  Restless 0 0  Easily annoyed or irritable 0 0  Afraid - awful might happen 0 0  Total GAD 7 Score 12 6  Anxiety Difficulty Not difficult at all -        History Grason has a past medical history of Hypertension; Hyperlipidemia; COPD (chronic obstructive pulmonary disease) ([place]); Obesity; Chronic pain; CHF (congestive heart failure) ([place]); and Atrial fibrillation ([place]).   He  has past surgical history that includes Percutaneous pinning and Fracture surgery.   His family history includes Asthma in his father; Cancer in his mother; Coronary artery disease in his other; Heart attack in his brother.He reports that he quit smoking about 25 years ago. His smoking use included Cigarettes. He has a 62.5 pack-year smoking history. He has never used smokeless tobacco. His alcohol and drug histories are not on file.  Current Outpatient Prescriptions on File Prior to Visit  Medication Sig Dispense Refill  . albuterol (PROVENTIL HFA;VENTOLIN HFA) 108 (90 BASE) MCG/ACT inhaler Inhale 2 puffs into the lungs every 6 (six) hours as needed for wheezing or shortness of breath.    Marland Kitchen aspirin 81 MG EC tablet Take 81 mg by mouth daily.  0  . budesonide-formoterol (SYMBICORT) 160-4.5 MCG/ACT inhaler Inhale 2 puffs into the lungs 2 (two) times daily. 1 Inhaler 5  . diltiazem (CARDIZEM CD) 180 MG 24 hr capsule TAKE 1 CAPSULE (180 MG TOTAL) BY MOUTH DAILY. 30 capsule 6  . furosemide (LASIX) 20 MG tablet Take 1 tablet (20 mg total) by mouth every other day. 30 tablet 11  . guaiFENesin (MUCINEX) 600 MG 12 hr tablet Take 2 tablets (1,200 mg total) by mouth 2 (two) times daily. 20 tablet 1  . metoprolol (LOPRESSOR) 50 MG tablet TAKE 1 TABLET (50 MG TOTAL) BY MOUTH DAILY. 30 tablet 5  . pravastatin (PRAVACHOL) 40 MG tablet TAKE 1 TABLET (40 MG TOTAL) BY MOUTH DAILY. 30 tablet 3  . tamsulosin (FLOMAX) 0.4 MG CAPS capsule Take 1 capsule by mouth daily.    . traMADol (  ULTRAM) 50 MG tablet TAKE 1 TABLET EVERY 8 HOURS AS NEEDED FOR MODERATE PAIN  5   No current facility-administered medications on file prior to visit.    ROS Review of Systems  Constitutional: Negative for fever, chills, diaphoresis and unexpected weight change.  HENT: Negative for congestion, hearing loss, rhinorrhea and sore throat.   Eyes: Negative for visual disturbance.  Respiratory: Negative for cough and shortness of breath.     Cardiovascular: Negative for chest pain.  Gastrointestinal: Negative for abdominal pain, diarrhea and constipation.  Genitourinary: Negative for dysuria and flank pain.  Musculoskeletal: Negative for joint swelling and arthralgias.  Skin: Negative for rash.  Neurological: Negative for dizziness and headaches.  Psychiatric/Behavioral: Negative for sleep disturbance and dysphoric mood. The patient is nervous/anxious.     Objective:  BP 133/67 mmHg  Pulse 83  Temp(Src) 97.1 F (36.2 C) (Oral)  Ht _0  (1.727 m)  Wt 214 lb (97.07 kg)  BMI 32.55 kg/m2  SpO2 74%  BP Readings from Last 3 Encounters:  04/07/16 133/67  02/01/16 118/62  01/28/16 118/76    Wt Readings from Last 3 Encounters:  04/07/16 214 lb (97.07 kg)  02/01/16 222 lb (100.699 kg)  01/28/16 213 lb 12.8 oz (96.979 kg)     Physical Exam  Constitutional: He is oriented to person, place, and time. He appears well-developed and well-nourished. No distress.  HENT:  Head: Normocephalic and atraumatic.  Right Ear: External ear normal.  Left Ear: External ear normal.  Nose: Nose normal.  Mouth/Throat: Oropharynx is clear and moist.  Eyes: Conjunctivae and EOM are normal. Pupils are equal, round, and reactive to light.  Neck: Normal range of motion. Neck supple. No thyromegaly present.  Cardiovascular: Normal rate, regular rhythm and normal heart sounds.   No murmur heard. Pulmonary/Chest: Effort normal and breath sounds normal. No respiratory distress. He has no wheezes. He has no rales.  Abdominal: Soft. Bowel sounds are normal. He exhibits no distension. There is no tenderness.  Lymphadenopathy:    He has no cervical adenopathy.  Neurological: He is alert and oriented to person, place, and time. He has normal reflexes.  Skin: Skin is warm and dry.  Psychiatric: He has a normal mood and affect. His behavior is normal. Judgment and thought content normal.    Lab Results  Component Value Date   HGBA1C 6.0*  08/27/2015    Lab Results  Component Value Date   WBC 9.3 01/08/2016   HGB 14.7 12/07/2015   HCT 43.6 01/08/2016   PLT 249 01/08/2016   GLUCOSE 104* 01/08/2016   CHOL 178 09/08/2015   TRIG 83 12/03/2015   HDL 81 09/08/2015   LDLCALC 78 09/08/2015   ALT 9 01/08/2016   AST 15 01/08/2016   NA 146* 01/08/2016   K 4.3 01/08/2016   CL 99 01/08/2016   CREATININE 0.90 01/08/2016   BUN 13 01/08/2016   CO2 28 01/08/2016   TSH 0.750 11/29/2015   PSA 1.9 03/31/2014   INR 1.38 08/27/2015   HGBA1C 6.0* 08/27/2015    No results found.  Assessment & Plan:   Prestyn was seen today for hypertension, copd, atrial fibrillation, gad and joint pain.  Diagnoses and all orders for this visit:  Prediabetes -     Bayer DCA Hb A1c Waived -     CMP14+EGFR  Chronic atrial fibrillation ([place]) -     CMP14+EGFR  COPD mixed type ([place]) -     CMP14+EGFR  Essential hypertension -  CMP14+EGFR  Hypokalemia -     CMP14+EGFR  Chronic respiratory failure with hypoxia (HCC) -     CMP14+EGFR  Anxiety -     CMP14+EGFR  HLD (hyperlipidemia) -     CMP14+EGFR  Other orders -     LORazepam (ATIVAN) 1 MG tablet; Take 1 tablet (1 mg total) by mouth every 8 (eight) hours as needed for anxiety.   I have changed Mr. Sharber's LORazepam. I am also having him maintain his albuterol, guaiFENesin, aspirin, tamsulosin, metoprolol, pravastatin, traMADol, furosemide, budesonide-formoterol, and diltiazem.  Meds ordered this encounter  Medications  . LORazepam (ATIVAN) 1 MG tablet    Sig: Take 1 tablet (1 mg total) by mouth every 8 (eight) hours as needed for anxiety.    Dispense:  60 tablet    Refill:  5    This request is for a new prescription for a controlled substance as required by Federal/State law..     Follow-up: Return in about 3 months (around 07/08/2016) for hypertension.  Claretta Fraise, M.D.

## 2016-04-08 LAB — CMP14+EGFR
ALBUMIN: 3.7 g/dL (ref 3.5–4.7)
ALK PHOS: 85 IU/L (ref 39–117)
ALT: 13 IU/L (ref 0–44)
AST: 16 IU/L (ref 0–40)
Albumin/Globulin Ratio: 1.3 (ref 1.2–2.2)
BUN / CREAT RATIO: 19 (ref 10–24)
BUN: 17 mg/dL (ref 8–27)
Bilirubin Total: 0.6 mg/dL (ref 0.0–1.2)
CO2: 30 mmol/L — AB (ref 18–29)
CREATININE: 0.91 mg/dL (ref 0.76–1.27)
Calcium: 9.6 mg/dL (ref 8.6–10.2)
Chloride: 99 mmol/L (ref 96–106)
GFR, EST AFRICAN AMERICAN: 92 mL/min/{1.73_m2} (ref >59)
GFR, EST NON AFRICAN AMERICAN: 79 mL/min/{1.73_m2} (ref >59)
GLOBULIN, TOTAL: 2.9 g/dL (ref 1.5–4.5)
Glucose: 95 mg/dL (ref 65–99)
Potassium: 4.5 mmol/L (ref 3.5–5.2)
SODIUM: 144 mmol/L (ref 134–144)
TOTAL PROTEIN: 6.6 g/dL (ref 6.0–8.5)

## 2016-04-11 ENCOUNTER — Other Ambulatory Visit: Payer: Self-pay | Admitting: Family Medicine

## 2016-04-17 DIAGNOSIS — J449 Chronic obstructive pulmonary disease, unspecified: Secondary | ICD-10-CM | POA: Diagnosis not present

## 2016-04-20 ENCOUNTER — Encounter: Payer: Self-pay | Admitting: Cardiology

## 2016-04-20 ENCOUNTER — Ambulatory Visit (INDEPENDENT_AMBULATORY_CARE_PROVIDER_SITE_OTHER): Payer: Medicare Other | Admitting: Cardiology

## 2016-04-20 VITALS — BP 100/68 | HR 76 | Ht 68.0 in | Wt 213.0 lb

## 2016-04-20 DIAGNOSIS — I482 Chronic atrial fibrillation, unspecified: Secondary | ICD-10-CM

## 2016-04-20 NOTE — Patient Instructions (Signed)
Medication Instructions:  The current medical regimen is effective;  continue present plan and medications.  Follow-Up: Follow up in 6 months with Dr. Hochrein.  You will receive a letter in the mail 2 months before you are due.  Please call us when you receive this letter to schedule your follow up appointment.  If you need a refill on your cardiac medications before your next appointment, please call your pharmacy.  Thank you for choosing [facility]!!       

## 2016-04-20 NOTE — Progress Notes (Signed)
Cardiology Office Note   Date:  04/20/2016   ID:  Logan West, DOB 21-Jan-1934, MRN [id number]  PCP:  Mechele ClaudeSTACKS,WARREN, MD  Cardiologist:   Rollene RotundaJames Kelee Cunningham, MD   Chief Complaint  Patient presents with  . Atrial Fibrillation      History of Present Illness: Logan West is a 82 y.o. male who presents for follow up of atrial fibrillation.  Since I last saw him he has done well.  He has been seen in the atrial fib clinic and his rate is controlled.  He has still refused to take any anticoagulation mostly because of cough.    He does have a mildly reduced EF.   He has not wanted a right and left heart cath which I suggested.  He was on Eliquis.  He has had hematuria but this resolved.  He has not wanted to be back on anticoagulation because of cost in part.    The patient denies any new symptoms such as chest discomfort, neck or arm discomfort. There has been no new shortness of breath, PND or orthopnea. There have been no reported palpitations, presyncope or syncope.  He does wear O2 at all times.   Past Medical History  Diagnosis Date  . Hypertension     x30 years  . Hyperlipidemia     x 7-8  . COPD (chronic obstructive pulmonary disease) (HCC)   . Obesity   . Chronic pain   . CHF (congestive heart failure) (HCC)   . Atrial fibrillation [facility](HCC)     Past Surgical History  Procedure Laterality Date  . Percutaneous pinning      right forearm fracture  . Fracture surgery      right forearm     Current Outpatient Prescriptions  Medication Sig Dispense Refill  . albuterol (PROVENTIL HFA;VENTOLIN HFA) 108 (90 BASE) MCG/ACT inhaler Inhale 2 puffs into the lungs every 6 (six) hours as needed for wheezing or shortness of breath.    Marland Kitchen. aspirin 81 MG EC tablet Take 81 mg by mouth daily.  0  . budesonide-formoterol (SYMBICORT) 160-4.5 MCG/ACT inhaler Inhale 2 puffs into the lungs 2 (two) times daily. 1 Inhaler 5  . diltiazem (CARDIZEM CD) 180 MG 24 hr capsule TAKE 1 CAPSULE (180 MG TOTAL)  BY MOUTH DAILY. 30 capsule 6  . furosemide (LASIX) 20 MG tablet Take 1 tablet (20 mg total) by mouth every other day. 30 tablet 11  . guaiFENesin (MUCINEX) 600 MG 12 hr tablet Take 2 tablets (1,200 mg total) by mouth 2 (two) times daily. 20 tablet 1  . LORazepam (ATIVAN) 1 MG tablet Take 1 tablet (1 mg total) by mouth every 8 (eight) hours as needed for anxiety. 60 tablet 5  . metoprolol (LOPRESSOR) 50 MG tablet TAKE 1 TABLET (50 MG TOTAL) BY MOUTH DAILY. 30 tablet 5  . pravastatin (PRAVACHOL) 40 MG tablet TAKE 1 TABLET (40 MG TOTAL) BY MOUTH DAILY. 30 tablet 0  . tamsulosin (FLOMAX) 0.4 MG CAPS capsule Take 1 capsule by mouth daily.    . traMADol (ULTRAM) 50 MG tablet TAKE 1 TABLET EVERY 8 HOURS AS NEEDED FOR MODERATE PAIN 60 tablet 5   No current facility-administered medications for this visit.    Allergies:   Review of patient's allergies indicates no known allergies.    ROS:  Please see the history of present illness.   Otherwise, review of systems are positive for none.   All other systems are reviewed and negative.    PHYSICAL  EXAM: VS:  BP 100/68 mmHg  Pulse 76  Ht  (1.727 m)  Wt 213 lb (96.616 kg)  BMI 32.39 kg/m2 , BMI Body mass index is 32.39 kg/(m^2). GENERAL:  Chronically ill-appearing HEENT:  Pupils equal round and reactive, fundi not visualized, oral mucosa unremarkable NECK:  No jugular venous distention, waveform within normal limits, carotid upstroke brisk and symmetric, no bruits, no thyromegaly LUNGS:  Decreased breath sounds bilaterally with few scattered crackles BACK:  No CVA tenderness CHEST:  Unremarkable HEART:  PMI not displaced or sustained,S1 and S2 within normal limits, no S3,  no clicks, no rubs, no murmurs, irregular ABD:  Flat, positive bowel sounds normal in frequency in pitch, no bruits, no rebound, no guarding, no midline pulsatile mass, no hepatomegaly, no splenomegaly EXT:  2 plus pulses throughout, mild edema, no cyanosis no  clubbing   EKG:  EKG is not ordered today.   Recent Labs: 11/29/2015: TSH 0.750 12/07/2015: Hemoglobin 14.7 01/08/2016: BNP 162.0*; Platelets 249 04/07/2016: ALT 13; BUN 17; Creatinine, Ser 0.91; Potassium 4.5; Sodium 144    Lipid Panel    Component Value Date/Time   CHOL 178 09/08/2015 1201   CHOL 142 05/28/2013 1050   TRIG 83 12/03/2015 0437   TRIG 93 05/28/2013 1050   HDL 81 09/08/2015 1201   HDL 38* 05/28/2013 1050   CHOLHDL 2.2 09/08/2015 1201   LDLCALC 78 09/08/2015 1201   LDLCALC 85 05/28/2013 1050      Wt Readings from Last 3 Encounters:  04/20/16 213 lb (96.616 kg)  04/07/16 214 lb (97.07 kg)  02/01/16 222 lb (100.699 kg)      Other studies Reviewed: Additional studies/ records that were reviewed today include: None Review of the above records demonstrates:  P   ASSESSMENT AND PLAN:  ATRIAL FIB:    His heart rate seems to be controlled.  Logan West has a CHA2DS2 - VASc score of 4 with a risk of stroke of 4%.  However, he still doesn't want to take anticoagulation.  We had another long discussion about this.  CM:  The patient had slightly reduced ejection fraction.  He's not interested in right and left heart catheterization at this point.  He is doing better with leg swelling since starting PRN Lasix.     Current medicines are reviewed at length with the patient today.  The patient does not have concerns regarding medicines.  The following changes have been made:  None  Labs/ tests ordered today include:   None   Disposition:   FU with me in six months.    Signed, Rollene Rotunda, MD  04/20/2016 1:18 PM    [facility]

## 2016-05-02 DIAGNOSIS — J449 Chronic obstructive pulmonary disease, unspecified: Secondary | ICD-10-CM | POA: Diagnosis not present

## 2016-05-18 DIAGNOSIS — J449 Chronic obstructive pulmonary disease, unspecified: Secondary | ICD-10-CM | POA: Diagnosis not present

## 2016-06-01 DIAGNOSIS — J449 Chronic obstructive pulmonary disease, unspecified: Secondary | ICD-10-CM | POA: Diagnosis not present

## 2016-06-09 ENCOUNTER — Other Ambulatory Visit: Payer: Self-pay | Admitting: Family Medicine

## 2016-06-17 DIAGNOSIS — J449 Chronic obstructive pulmonary disease, unspecified: Secondary | ICD-10-CM | POA: Diagnosis not present

## 2016-07-02 DIAGNOSIS — J449 Chronic obstructive pulmonary disease, unspecified: Secondary | ICD-10-CM | POA: Diagnosis not present

## 2016-07-08 ENCOUNTER — Other Ambulatory Visit: Payer: Self-pay | Admitting: Family Medicine

## 2016-07-18 DIAGNOSIS — J449 Chronic obstructive pulmonary disease, unspecified: Secondary | ICD-10-CM | POA: Diagnosis not present

## 2016-07-19 ENCOUNTER — Encounter: Payer: Self-pay | Admitting: Family Medicine

## 2016-07-19 ENCOUNTER — Ambulatory Visit (INDEPENDENT_AMBULATORY_CARE_PROVIDER_SITE_OTHER): Payer: Medicare Other | Admitting: Family Medicine

## 2016-07-19 VITALS — BP 114/69 | HR 74 | Temp 97.2°F | Ht 68.0 in | Wt 211.6 lb

## 2016-07-19 DIAGNOSIS — R7303 Prediabetes: Secondary | ICD-10-CM | POA: Diagnosis not present

## 2016-07-19 DIAGNOSIS — J449 Chronic obstructive pulmonary disease, unspecified: Secondary | ICD-10-CM | POA: Diagnosis not present

## 2016-07-19 DIAGNOSIS — I482 Chronic atrial fibrillation, unspecified: Secondary | ICD-10-CM

## 2016-07-19 DIAGNOSIS — I10 Essential (primary) hypertension: Secondary | ICD-10-CM | POA: Diagnosis not present

## 2016-07-19 DIAGNOSIS — J9611 Chronic respiratory failure with hypoxia: Secondary | ICD-10-CM | POA: Diagnosis not present

## 2016-07-19 DIAGNOSIS — E785 Hyperlipidemia, unspecified: Secondary | ICD-10-CM | POA: Diagnosis not present

## 2016-07-19 NOTE — Progress Notes (Signed)
Subjective:  Patient ID: Logan West, male    DOB: 1934/02/04  Age: 82 y.o. MRN: [id number]  CC: Hypertension (3 mth rck); Hyperlipidemia; and COPD   HPI Logan West presents for  follow-up of hypertension. Patient has no history of headache chest pain or shortness of breath or recent cough. Patient also denies symptoms of TIA such as numbness weakness lateralizing. Patient checks  blood pressure at home and has not had any elevated readings recently. Patient denies side effects from medication. States taking it regularly.  using symbicort with occasional supplement with albuterol. Continues to use O2 2l N/C for COPD. Able to lie down at night, but can not exert for more than a slow walk. Denies wheeze.  Patient in for follow-up of elevated cholesterol. Doing well without complaints on current medication. Denies side effects of statin including myalgia and arthralgia and nausea. Also in today for liver function testing. Currently no chest pain, shortness of breath or other cardiovascular related symptoms noted.   History Logan West has a past medical history of Atrial fibrillation ([place]); CHF (congestive heart failure) ([place]); Chronic pain; COPD (chronic obstructive pulmonary disease) ([place]); Hyperlipidemia; Hypertension; and Obesity.   He has a past surgical history that includes Percutaneous pinning and Fracture surgery.   His family history includes Asthma in his father; Cancer in his mother; Coronary artery disease in his other; Heart attack in his brother.He reports that he quit smoking about 25 years ago. His smoking use included Cigarettes. He has a 62.50 pack-year smoking history. He has never used smokeless tobacco. His alcohol and drug histories are not on file.  Current Outpatient Prescriptions on File Prior to Visit  Medication Sig Dispense Refill  . albuterol (PROVENTIL HFA;VENTOLIN HFA) 108 (90 BASE) MCG/ACT inhaler Inhale 2 puffs into the lungs every 6 (six) hours as needed for  wheezing or shortness of breath.    Marland Kitchen aspirin 81 MG EC tablet Take 81 mg by mouth daily.  0  . budesonide-formoterol (SYMBICORT) 160-4.5 MCG/ACT inhaler Inhale 2 puffs into the lungs 2 (two) times daily. 1 Inhaler 5  . diltiazem (CARDIZEM CD) 180 MG 24 hr capsule TAKE 1 CAPSULE (180 MG TOTAL) BY MOUTH DAILY. 30 capsule 6  . furosemide (LASIX) 20 MG tablet Take 1 tablet (20 mg total) by mouth every other day. 30 tablet 11  . guaiFENesin (MUCINEX) 600 MG 12 hr tablet Take 2 tablets (1,200 mg total) by mouth 2 (two) times daily. 20 tablet 1  . LORazepam (ATIVAN) 1 MG tablet Take 1 tablet (1 mg total) by mouth every 8 (eight) hours as needed for anxiety. 60 tablet 5  . metoprolol (LOPRESSOR) 50 MG tablet TAKE 1 TABLET (50 MG TOTAL) BY MOUTH DAILY. 30 tablet 5  . pravastatin (PRAVACHOL) 40 MG tablet TAKE 1 TABLET (40 MG TOTAL) BY MOUTH DAILY. 30 tablet 3  . tamsulosin (FLOMAX) 0.4 MG CAPS capsule Take 1 capsule by mouth daily.    . traMADol (ULTRAM) 50 MG tablet TAKE 1 TABLET EVERY 8 HOURS AS NEEDED FOR MODERATE PAIN 60 tablet 5   No current facility-administered medications on file prior to visit.     ROS Review of Systems  Constitutional: Negative for chills, diaphoresis, fever and unexpected weight change.  HENT: Negative for congestion, hearing loss, rhinorrhea and sore throat.   Eyes: Negative for visual disturbance.  Respiratory: Positive for shortness of breath (  See history of present illness). Negative for cough.   Cardiovascular: Negative for chest pain.  Gastrointestinal: Negative  for abdominal pain, constipation and diarrhea.  Genitourinary: Negative for dysuria and flank pain.  Musculoskeletal: Negative for arthralgias and joint swelling.  Skin: Negative for rash.  Neurological: Negative for dizziness and headaches.  Psychiatric/Behavioral: Negative for dysphoric mood and sleep disturbance.    Objective:  BP 114/69 (BP Location: Left Arm, Patient Position: Sitting, Cuff Size:  Normal)   Pulse 74   Temp 97.2 F (36.2 C) (Oral)   Ht 5' 8"  (1.727 m)   Wt 211 lb 9.6 oz (96 kg)   SpO2 (!) 89% Comment: RA w/ exertion  BMI 32.17 kg/m   BP Readings from Last 3 Encounters:  07/19/16 114/69  04/20/16 100/68  04/07/16 133/67    Wt Readings from Last 3 Encounters:  07/19/16 211 lb 9.6 oz (96 kg)  04/20/16 213 lb (96.6 kg)  04/07/16 214 lb (97.1 kg)     Physical Exam   Lab Results  Component Value Date   WBC 9.3 01/08/2016   HGB 14.7 12/07/2015   HCT 43.6 01/08/2016   PLT 249 01/08/2016   GLUCOSE 95 04/07/2016   CHOL 178 09/08/2015   TRIG 83 12/03/2015   HDL 81 09/08/2015   LDLCALC 78 09/08/2015   ALT 13 04/07/2016   AST 16 04/07/2016   NA 144 04/07/2016   K 4.5 04/07/2016   CL 99 04/07/2016   CREATININE 0.91 04/07/2016   BUN 17 04/07/2016   CO2 30 (H) 04/07/2016   TSH 0.750 11/29/2015   PSA 1.9 03/31/2014   INR 1.38 08/27/2015   HGBA1C 6.0 (H) 08/27/2015    No results found.  Assessment & Plan:   Blayne was seen today for hypertension, hyperlipidemia and copd.  Diagnoses and all orders for this visit:  Chronic atrial fibrillation ([place]) -     CBC with Differential/Platelet -     CMP14+EGFR  COPD mixed type ([place]) -     CBC with Differential/Platelet -     CMP14+EGFR  Chronic respiratory failure with hypoxia (HCC) -     CBC with Differential/Platelet -     CMP14+EGFR  Essential hypertension -     CBC with Differential/Platelet -     CMP14+EGFR  HLD (hyperlipidemia) -     CBC with Differential/Platelet -     CMP14+EGFR -     Lipid panel  Prediabetes -     CBC with Differential/Platelet -     CMP14+EGFR   I am having Logan West maintain his albuterol, guaiFENesin, aspirin, tamsulosin, furosemide, budesonide-formoterol, diltiazem, LORazepam, traMADol, metoprolol, and pravastatin.  No orders of the defined types were placed in this encounter.   Follow-up: Return in about 3 months (around 10/19/2016).  Claretta Fraise,  M.D.

## 2016-07-20 LAB — CMP14+EGFR
A/G RATIO: 1.1 — AB (ref 1.2–2.2)
ALBUMIN: 3.5 g/dL (ref 3.5–4.7)
ALT: 11 IU/L (ref 0–44)
AST: 15 IU/L (ref 0–40)
Alkaline Phosphatase: 83 IU/L (ref 39–117)
BUN / CREAT RATIO: 20 (ref 10–24)
BUN: 19 mg/dL (ref 8–27)
Bilirubin Total: 0.5 mg/dL (ref 0.0–1.2)
CALCIUM: 9.9 mg/dL (ref 8.6–10.2)
CO2: 31 mmol/L — ABNORMAL HIGH (ref 18–29)
Chloride: 97 mmol/L (ref 96–106)
Creatinine, Ser: 0.97 mg/dL (ref 0.76–1.27)
GFR, EST AFRICAN AMERICAN: 85 mL/min/{1.73_m2} (ref >59)
GFR, EST NON AFRICAN AMERICAN: 73 mL/min/{1.73_m2} (ref >59)
GLOBULIN, TOTAL: 3.3 g/dL (ref 1.5–4.5)
Glucose: 87 mg/dL (ref 65–99)
POTASSIUM: 4.7 mmol/L (ref 3.5–5.2)
SODIUM: 143 mmol/L (ref 134–144)
Total Protein: 6.8 g/dL (ref 6.0–8.5)

## 2016-07-20 LAB — LIPID PANEL
CHOL/HDL RATIO: 3.2 ratio (ref 0.0–5.0)
Cholesterol, Total: 160 mg/dL (ref 100–199)
HDL: 50 mg/dL (ref >39)
LDL CALC: 92 mg/dL (ref 0–99)
TRIGLYCERIDES: 92 mg/dL (ref 0–149)
VLDL Cholesterol Cal: 18 mg/dL (ref 5–40)

## 2016-07-20 LAB — CBC WITH DIFFERENTIAL/PLATELET
BASOS: 0 %
Basophils Absolute: 0 10*3/uL (ref 0.0–0.2)
EOS (ABSOLUTE): 0.4 10*3/uL (ref 0.0–0.4)
EOS: 6 %
HEMATOCRIT: 42.2 % (ref 37.5–51.0)
HEMOGLOBIN: 13.6 g/dL (ref 12.6–17.7)
IMMATURE GRANULOCYTES: 0 %
Immature Grans (Abs): 0 10*3/uL (ref 0.0–0.1)
LYMPHS ABS: 1.6 10*3/uL (ref 0.7–3.1)
Lymphs: 25 %
MCH: 29.7 pg (ref 26.6–33.0)
MCHC: 32.2 g/dL (ref 31.5–35.7)
MCV: 92 fL (ref 79–97)
MONOS ABS: 0.6 10*3/uL (ref 0.1–0.9)
Monocytes: 9 %
NEUTROS PCT: 60 %
Neutrophils Absolute: 3.9 10*3/uL (ref 1.4–7.0)
Platelets: 187 10*3/uL (ref 150–379)
RBC: 4.58 x10E6/uL (ref 4.14–5.80)
RDW: 14.6 % (ref 12.3–15.4)
WBC: 6.5 10*3/uL (ref 3.4–10.8)

## 2016-08-02 DIAGNOSIS — J449 Chronic obstructive pulmonary disease, unspecified: Secondary | ICD-10-CM | POA: Diagnosis not present

## 2016-08-18 DIAGNOSIS — J449 Chronic obstructive pulmonary disease, unspecified: Secondary | ICD-10-CM | POA: Diagnosis not present

## 2016-09-01 DIAGNOSIS — J449 Chronic obstructive pulmonary disease, unspecified: Secondary | ICD-10-CM | POA: Diagnosis not present

## 2016-09-17 DIAGNOSIS — J449 Chronic obstructive pulmonary disease, unspecified: Secondary | ICD-10-CM | POA: Diagnosis not present

## 2016-09-22 ENCOUNTER — Other Ambulatory Visit: Payer: Self-pay | Admitting: Family Medicine

## 2016-09-27 ENCOUNTER — Ambulatory Visit (INDEPENDENT_AMBULATORY_CARE_PROVIDER_SITE_OTHER): Payer: Medicare Other | Admitting: Family Medicine

## 2016-09-27 ENCOUNTER — Encounter: Payer: Self-pay | Admitting: Family Medicine

## 2016-09-27 VITALS — BP 109/72 | HR 81 | Temp 97.0°F | Resp 16 | Ht 68.0 in | Wt 213.2 lb

## 2016-09-27 DIAGNOSIS — I10 Essential (primary) hypertension: Secondary | ICD-10-CM

## 2016-09-27 DIAGNOSIS — F419 Anxiety disorder, unspecified: Secondary | ICD-10-CM | POA: Diagnosis not present

## 2016-09-27 DIAGNOSIS — I482 Chronic atrial fibrillation, unspecified: Secondary | ICD-10-CM

## 2016-09-27 DIAGNOSIS — R7303 Prediabetes: Secondary | ICD-10-CM

## 2016-09-27 DIAGNOSIS — Z23 Encounter for immunization: Secondary | ICD-10-CM | POA: Diagnosis not present

## 2016-09-27 MED ORDER — LORAZEPAM 1 MG PO TABS: 1.0000 mg | ORAL_TABLET | Freq: Three times a day (TID) | ORAL | 5 refills | Status: DC | PRN

## 2016-09-27 MED ORDER — TRAMADOL HCL 50 MG PO TABS: ORAL_TABLET | ORAL | 5 refills | Status: DC

## 2016-09-27 NOTE — Progress Notes (Signed)
Subjective:  Patient ID: Logan West Antwi, male    DOB: 1934/03/18  Age: 82 y.o. MRN: [id number]  CC: Medication Refill   HPI Logan West [place] presents for  follow-up of hypertension. Patient has no history of headache chest pain or shortness of breath or recent cough. Patient also denies symptoms of TIA such as numbness weakness lateralizing. Patient checks  blood pressure at home and has not had any elevated readings recently. Patient denies side effects from his medication. States taking it regularly.  Patient in for follow-up of atrial fibrillation. Patient denies any recent bouts of chest pain or palpitations. Additionally, patient is taking anticoagulants. Patient denies any recent excessive bleeding episodes including epistaxis, bleeding from the gums, genitalia, rectal bleeding or hematuria. Additionally there has been no excessive bruising. Depression screen [facility] 2/9 09/27/2016 07/19/2016 04/07/2016 02/08/2016 09/21/2015  Decreased Interest 0 0 0 0 3  Down, Depressed, Hopeless 0 0 0 0 3  PHQ - 2 Score 0 0 0 0 6  Altered sleeping - - - - 3  Tired, decreased energy - - - - 3  Change in appetite - - - - 0  Feeling bad or failure about yourself  - - - - 1  Trouble concentrating - - - - 0  Moving slowly or fidgety/restless - - - - 0  Suicidal thoughts - - - - 0  PHQ-9 Score - - - - 13  Difficult doing work/chores - - - - -   GAD 7 : Generalized Anxiety Score 07/19/2016 04/07/2016 09/21/2015  Nervous, Anxious, on Edge 3 3 2   Control/stop worrying 2 3 2   Worry too much - different things 2 3 2   Trouble relaxing 0 3 0  Restless 0 0 0  Easily annoyed or irritable 0 0 0  Afraid - awful might happen 0 0 0  Total GAD 7 Score 7 12 6   Anxiety Difficulty Not difficult at all Not difficult at all -     History [name] has a past medical history of Atrial fibrillation (HCC); CHF (congestive heart failure) (HCC); Chronic pain; COPD (chronic obstructive pulmonary disease) (HCC); Hyperlipidemia;  Hypertension; and Obesity.   He has a past surgical history that includes Percutaneous pinning and Fracture surgery.   His family history includes Asthma in his father; Cancer in his mother; Coronary artery disease in his other; Heart attack in his brother.He reports that he quit smoking about 25 years ago. His smoking use included Cigarettes. He has a 62.50 pack-year smoking history. He has never used smokeless tobacco. His alcohol and drug histories are not on file.    ROS Review of Systems  Constitutional: Negative for chills, diaphoresis, fever and unexpected weight change.  HENT: Negative for congestion, hearing loss, rhinorrhea and sore throat.   Eyes: Negative for visual disturbance.  Respiratory: Negative for cough and shortness of breath.   Cardiovascular: Negative for chest pain.  Gastrointestinal: Negative for abdominal pain, constipation and diarrhea.  Genitourinary: Negative for dysuria and flank pain.  Musculoskeletal: Negative for arthralgias and joint swelling.  Skin: Negative for rash.  Neurological: Negative for dizziness and headaches.  Psychiatric/Behavioral: Negative for dysphoric mood and sleep disturbance.    Objective:  BP 109/72   Pulse 81   Temp 97 F (36.1 C) (Oral)   Resp 16   Ht 5\' 8"  (1.727 m)   Wt 213 lb 3.2 oz (96.7 kg)   SpO2 92% Comment: 2L O2  BMI 32.42 kg/m   BP Readings from Last 3 Encounters:  09/27/16 109/72  07/19/16 114/69  04/20/16 100/68    Wt Readings from Last 3 Encounters:  09/27/16 213 lb 3.2 oz (96.7 kg)  07/19/16 211 lb 9.6 oz (96 kg)  04/20/16 213 lb (96.6 kg)     Physical Exam  Constitutional: He is oriented to person, place, and time. He appears well-developed and well-nourished.  HENT:  Head: Normocephalic and atraumatic.  Mouth/Throat: Oropharynx is clear and moist.  Eyes: EOM are normal. Pupils are equal, round, and reactive to light.  Neck: Normal range of motion. No tracheal deviation present. No  thyromegaly present.  Cardiovascular: Normal rate, regular rhythm and normal heart sounds.  Exam reveals no gallop and no friction rub.   No murmur heard. Pulmonary/Chest: Breath sounds normal. He has no wheezes. He has no rales.  Abdominal: Soft. He exhibits no mass. There is no tenderness.  Musculoskeletal: Normal range of motion. He exhibits no edema.  Neurological: He is alert and oriented to person, place, and time.  Skin: Skin is warm and dry.  Psychiatric: He has a normal mood and affect.     Lab Results  Component Value Date   WBC 6.5 07/19/2016   HGB 14.7 12/07/2015   HCT 42.2 07/19/2016   PLT 187 07/19/2016   GLUCOSE 87 07/19/2016   CHOL 160 07/19/2016   TRIG 92 07/19/2016   HDL 50 07/19/2016   LDLCALC 92 07/19/2016   ALT 11 07/19/2016   AST 15 07/19/2016   NA 143 07/19/2016   K 4.7 07/19/2016   CL 97 07/19/2016   CREATININE 0.97 07/19/2016   BUN 19 07/19/2016   CO2 31 (H) 07/19/2016   TSH 0.750 11/29/2015   PSA 1.9 03/31/2014   INR 1.38 08/27/2015   HGBA1C 6.0 (H) 08/27/2015    No results found.  Assessment & Plan:   [name] was seen today for medication refill.  Diagnoses and all orders for this visit:  Chronic atrial fibrillation (HCC)  Encounter for immunization -     Flu Vaccine QUAD 36+ mos IM  Anxiety  Essential hypertension  Prediabetes  Other orders -     traMADol (ULTRAM) 50 MG tablet; TAKE 1 TABLET EVERY 8 HOURS AS NEEDED FOR MODERATE PAIN -     LORazepam (ATIVAN) 1 MG tablet; Take 1 tablet (1 mg total) by mouth every 8 (eight) hours as needed for anxiety.   I am having [name] maintain his albuterol, guaiFENesin, aspirin, tamsulosin, furosemide, budesonide-formoterol, metoprolol, pravastatin, traMADol, and LORazepam.  Meds ordered this encounter  Medications  . traMADol (ULTRAM) 50 MG tablet    Sig: TAKE 1 TABLET EVERY 8 HOURS AS NEEDED FOR MODERATE PAIN    Dispense:  60 tablet    Refill:  5  . LORazepam (ATIVAN) 1 MG tablet      Sig: Take 1 tablet (1 mg total) by mouth every 8 (eight) hours as needed for anxiety.    Dispense:  60 tablet    Refill:  5    This request is for a new prescription for a controlled substance as required by Federal/State law..     Follow-up: Return in about 3 months (around 12/28/2016).  Mechele ClaudeWarren Sania Noy, M.D.

## 2016-09-28 ENCOUNTER — Ambulatory Visit: Payer: Medicare Other | Admitting: Family Medicine

## 2016-10-02 DIAGNOSIS — J449 Chronic obstructive pulmonary disease, unspecified: Secondary | ICD-10-CM | POA: Diagnosis not present

## 2016-10-04 ENCOUNTER — Other Ambulatory Visit (HOSPITAL_COMMUNITY): Payer: Self-pay | Admitting: Nurse Practitioner

## 2016-10-18 ENCOUNTER — Other Ambulatory Visit: Payer: Self-pay | Admitting: Family Medicine

## 2016-10-18 DIAGNOSIS — J449 Chronic obstructive pulmonary disease, unspecified: Secondary | ICD-10-CM | POA: Diagnosis not present

## 2016-10-25 ENCOUNTER — Ambulatory Visit: Payer: Medicare Other | Admitting: Family Medicine

## 2016-10-29 ENCOUNTER — Other Ambulatory Visit: Payer: Self-pay | Admitting: Family Medicine

## 2016-10-31 ENCOUNTER — Other Ambulatory Visit: Payer: Self-pay | Admitting: Family Medicine

## 2016-10-31 NOTE — Telephone Encounter (Signed)
Per [facility], pt does have refills on Tramadol  Pt notified

## 2016-11-01 DIAGNOSIS — J449 Chronic obstructive pulmonary disease, unspecified: Secondary | ICD-10-CM | POA: Diagnosis not present

## 2016-11-09 ENCOUNTER — Other Ambulatory Visit: Payer: Self-pay | Admitting: Family Medicine

## 2016-11-17 DIAGNOSIS — J449 Chronic obstructive pulmonary disease, unspecified: Secondary | ICD-10-CM | POA: Diagnosis not present

## 2016-11-25 ENCOUNTER — Other Ambulatory Visit: Payer: Self-pay | Admitting: Cardiology

## 2016-11-25 DIAGNOSIS — I10 Essential (primary) hypertension: Secondary | ICD-10-CM

## 2016-11-25 NOTE — Telephone Encounter (Signed)
Rx(s) sent to pharmacy electronically.  

## 2016-12-02 DIAGNOSIS — J449 Chronic obstructive pulmonary disease, unspecified: Secondary | ICD-10-CM | POA: Diagnosis not present

## 2016-12-18 DIAGNOSIS — J449 Chronic obstructive pulmonary disease, unspecified: Secondary | ICD-10-CM | POA: Diagnosis not present

## 2016-12-26 ENCOUNTER — Encounter: Payer: Self-pay | Admitting: Family Medicine

## 2016-12-26 ENCOUNTER — Ambulatory Visit (INDEPENDENT_AMBULATORY_CARE_PROVIDER_SITE_OTHER): Payer: Medicare Other | Admitting: Family Medicine

## 2016-12-26 VITALS — BP 117/65 | HR 76 | Temp 96.9°F | Ht 68.0 in | Wt 214.0 lb

## 2016-12-26 DIAGNOSIS — I482 Chronic atrial fibrillation, unspecified: Secondary | ICD-10-CM

## 2016-12-26 DIAGNOSIS — R7303 Prediabetes: Secondary | ICD-10-CM | POA: Diagnosis not present

## 2016-12-26 DIAGNOSIS — R319 Hematuria, unspecified: Secondary | ICD-10-CM

## 2016-12-26 DIAGNOSIS — J449 Chronic obstructive pulmonary disease, unspecified: Secondary | ICD-10-CM

## 2016-12-26 DIAGNOSIS — F419 Anxiety disorder, unspecified: Secondary | ICD-10-CM | POA: Diagnosis not present

## 2016-12-26 DIAGNOSIS — I10 Essential (primary) hypertension: Secondary | ICD-10-CM

## 2016-12-26 LAB — URINALYSIS
Bilirubin, UA: NEGATIVE
Glucose, UA: NEGATIVE
Nitrite, UA: POSITIVE — AB
PH UA: 6 (ref 5.0–7.5)
RBC UA: NEGATIVE
SPEC GRAV UA: 1.02 (ref 1.005–1.030)
Urobilinogen, Ur: 1 mg/dL (ref 0.2–1.0)

## 2016-12-26 LAB — BAYER DCA HB A1C WAIVED: HB A1C (BAYER DCA - WAIVED): 5 % (ref <7.0)

## 2016-12-26 MED ORDER — DILTIAZEM HCL ER COATED BEADS 180 MG PO CP24: 180.0000 mg | ORAL_CAPSULE | Freq: Every day | ORAL | 1 refills | Status: DC

## 2016-12-26 MED ORDER — LORAZEPAM 1 MG PO TABS: 1.0000 mg | ORAL_TABLET | Freq: Three times a day (TID) | ORAL | 5 refills | Status: DC | PRN

## 2016-12-26 MED ORDER — TRAMADOL HCL 50 MG PO TABS: ORAL_TABLET | ORAL | 5 refills | Status: DC

## 2016-12-26 MED ORDER — FUROSEMIDE 20 MG PO TABS: 20.0000 mg | ORAL_TABLET | ORAL | 11 refills | Status: DC

## 2016-12-26 MED ORDER — METOPROLOL TARTRATE 50 MG PO TABS: ORAL_TABLET | ORAL | 5 refills | Status: DC

## 2016-12-26 MED ORDER — PRAVASTATIN SODIUM 40 MG PO TABS: ORAL_TABLET | ORAL | 11 refills | Status: DC

## 2016-12-26 MED ORDER — GUAIFENESIN ER 600 MG PO TB12: 1200.0000 mg | ORAL_TABLET | Freq: Two times a day (BID) | ORAL | 1 refills | Status: DC

## 2016-12-26 MED ORDER — TAMSULOSIN HCL 0.4 MG PO CAPS: 0.4000 mg | ORAL_CAPSULE | Freq: Every day | ORAL | 5 refills | Status: DC

## 2016-12-26 MED ORDER — ASPIRIN 81 MG PO TBEC: 81.0000 mg | DELAYED_RELEASE_TABLET | Freq: Every day | ORAL | 0 refills | Status: DC

## 2016-12-26 MED ORDER — BUDESONIDE-FORMOTEROL FUMARATE 160-4.5 MCG/ACT IN AERO: 2.0000 | INHALATION_SPRAY | Freq: Two times a day (BID) | RESPIRATORY_TRACT | 5 refills | Status: DC

## 2016-12-26 NOTE — Progress Notes (Signed)
Subjective:  Patient ID: Logan West, male    DOB: 1934/03/18  Age: 83 y.o. MRN: [id number]  CC: Medication Refill   HPI Logan West presents for  follow-up of hypertension. Patient has no history of headache chest pain or shortness of breath or recent cough. Patient also denies symptoms of TIA such as numbness weakness lateralizing. Patient checks  blood pressure at home and has not had any elevated readings recently. Patient denies side effects from his medication. States taking it regularly.  Patient in for follow-up of atrial fibrillation. Patient denies any recent bouts of chest pain or palpitations. Additionally, patient is taking anticoagulants. Patient denies any recent excessive bleeding episodes including epistaxis, bleeding from the gums, genitalia, rectal bleeding or hematuria. Additionally there has been no excessive bruising. Depression screen [facility] 2/9 09/27/2016 07/19/2016 04/07/2016 02/08/2016 09/21/2015  Decreased Interest 0 0 0 0 3  Down, Depressed, Hopeless 0 0 0 0 3  PHQ - 2 Score 0 0 0 0 6  Altered sleeping - - - - 3  Tired, decreased energy - - - - 3  Change in appetite - - - - 0  Feeling bad or failure about yourself  - - - - 1  Trouble concentrating - - - - 0  Moving slowly or fidgety/restless - - - - 0  Suicidal thoughts - - - - 0  PHQ-9 Score - - - - 13  Difficult doing work/chores - - - - -   GAD 7 : Generalized Anxiety Score 07/19/2016 04/07/2016 09/21/2015  Nervous, Anxious, on Edge 3 3 2   Control/stop worrying 2 3 2   Worry too much - different things 2 3 2   Trouble relaxing 0 3 0  Restless 0 0 0  Easily annoyed or irritable 0 0 0  Afraid - awful might happen 0 0 0  Total GAD 7 Score 7 12 6   Anxiety Difficulty Not difficult at all Not difficult at all -     History Logan West has a past medical history of Atrial fibrillation (HCC); CHF (congestive heart failure) (HCC); Chronic pain; COPD (chronic obstructive pulmonary disease) (HCC); Hyperlipidemia;  Hypertension; and Obesity.   He has a past surgical history that includes Percutaneous pinning and Fracture surgery.   His family history includes Asthma in his father; Cancer in his mother; Coronary artery disease in his other; Heart attack in his brother.He reports that he quit smoking about 25 years ago. His smoking use included Cigarettes. He has a 62.50 pack-year smoking history. He has never used smokeless tobacco. His alcohol and drug histories are not on file.    ROS Review of Systems  Constitutional: Negative for chills, diaphoresis, fever and unexpected weight change.  HENT: Negative for congestion, hearing loss, rhinorrhea and sore throat.   Eyes: Negative for visual disturbance.  Respiratory: Negative for cough and shortness of breath.   Cardiovascular: Negative for chest pain.  Gastrointestinal: Negative for abdominal pain, constipation and diarrhea.  Genitourinary: Negative for dysuria and flank pain.  Musculoskeletal: Negative for arthralgias and joint swelling.  Skin: Negative for rash.  Neurological: Negative for dizziness and headaches.  Psychiatric/Behavioral: Negative for dysphoric mood and sleep disturbance.    Objective:  BP 109/72   Pulse 81   Temp 97 F (36.1 C) (Oral)   Resp 16   Ht 5\' 8"  (1.727 m)   Wt 213 lb 3.2 oz (96.7 kg)   SpO2 92% Comment: 2L O2  BMI 32.42 kg/m   BP Readings from Last 3 Encounters:  09/27/16 109/72  07/19/16 114/69  04/20/16 100/68    Wt Readings from Last 3 Encounters:  09/27/16 213 lb 3.2 oz (96.7 kg)  07/19/16 211 lb 9.6 oz (96 kg)  04/20/16 213 lb (96.6 kg)     Physical Exam  Constitutional: He is oriented to person, place, and time. He appears well-developed and well-nourished.  HENT:  Head: Normocephalic and atraumatic.  Mouth/Throat: Oropharynx is clear and moist.  Eyes: EOM are normal. Pupils are equal, round, and reactive to light.  Neck: Normal range of motion. No tracheal deviation present. No  thyromegaly present.  Cardiovascular: Normal rate, regular rhythm and normal heart sounds.  Exam reveals no gallop and no friction rub.   No murmur heard. Pulmonary/Chest: Breath sounds normal. He has no wheezes. He has no rales.  Abdominal: Soft. He exhibits no mass. There is no tenderness.  Musculoskeletal: Normal range of motion. He exhibits no edema.  Neurological: He is alert and oriented to person, place, and time.  Skin: Skin is warm and dry.  Psychiatric: He has a normal mood and affect.     Lab Results  Component Value Date   WBC 6.5 07/19/2016   HGB 14.7 12/07/2015   HCT 42.2 07/19/2016   PLT 187 07/19/2016   GLUCOSE 87 07/19/2016   CHOL 160 07/19/2016   TRIG 92 07/19/2016   HDL 50 07/19/2016   LDLCALC 92 07/19/2016   ALT 11 07/19/2016   AST 15 07/19/2016   NA 143 07/19/2016   K 4.7 07/19/2016   CL 97 07/19/2016   CREATININE 0.97 07/19/2016   BUN 19 07/19/2016   CO2 31 (H) 07/19/2016   TSH 0.750 11/29/2015   PSA 1.9 03/31/2014   INR 1.38 08/27/2015   HGBA1C 6.0 (H) 08/27/2015    No results found.  Assessment & Plan:   Logan West was seen today for medication refill.  Diagnoses and all orders for this visit:  Chronic atrial fibrillation (HCC)  Encounter for immunization -     Flu Vaccine QUAD 36+ mos IM  Anxiety  Essential hypertension  Prediabetes  Other orders -     traMADol (ULTRAM) 50 MG tablet; TAKE 1 TABLET EVERY 8 HOURS AS NEEDED FOR MODERATE PAIN -     LORazepam (ATIVAN) 1 MG tablet; Take 1 tablet (1 mg total) by mouth every 8 (eight) hours as needed for anxiety.   I am having Logan West maintain his albuterol, guaiFENesin, aspirin, tamsulosin, furosemide, budesonide-formoterol, metoprolol, pravastatin, traMADol, and LORazepam.  Meds ordered this encounter  Medications  . traMADol (ULTRAM) 50 MG tablet    Sig: TAKE 1 TABLET EVERY 8 HOURS AS NEEDED FOR MODERATE PAIN    Dispense:  60 tablet    Refill:  5  . LORazepam (ATIVAN) 1 MG tablet      Sig: Take 1 tablet (1 mg total) by mouth every 8 (eight) hours as needed for anxiety.    Dispense:  60 tablet    Refill:  5    This request is for a new prescription for a controlled substance as required by Federal/State law..     Follow-up: Return in about 3 months (around 12/28/2016).  Mechele ClaudeWarren Andres Bantz, M.D.

## 2016-12-27 ENCOUNTER — Other Ambulatory Visit: Payer: Self-pay | Admitting: Family Medicine

## 2016-12-27 LAB — CMP14+EGFR
ALT: 16 IU/L (ref 0–44)
AST: 19 IU/L (ref 0–40)
Albumin/Globulin Ratio: 1.4 (ref 1.2–2.2)
Albumin: 3.8 g/dL (ref 3.5–4.7)
Alkaline Phosphatase: 72 IU/L (ref 39–117)
BUN/Creatinine Ratio: 23 (ref 10–24)
BUN: 19 mg/dL (ref 8–27)
Bilirubin Total: 0.5 mg/dL (ref 0.0–1.2)
CALCIUM: 9.8 mg/dL (ref 8.6–10.2)
CO2: 28 mmol/L (ref 18–29)
CREATININE: 0.81 mg/dL (ref 0.76–1.27)
Chloride: 100 mmol/L (ref 96–106)
GFR, EST AFRICAN AMERICAN: 97 mL/min/{1.73_m2} (ref >59)
GFR, EST NON AFRICAN AMERICAN: 84 mL/min/{1.73_m2} (ref >59)
GLOBULIN, TOTAL: 2.7 g/dL (ref 1.5–4.5)
Glucose: 149 mg/dL — ABNORMAL HIGH (ref 65–99)
POTASSIUM: 4.5 mmol/L (ref 3.5–5.2)
Sodium: 146 mmol/L — ABNORMAL HIGH (ref 134–144)
TOTAL PROTEIN: 6.5 g/dL (ref 6.0–8.5)

## 2016-12-27 MED ORDER — CIPROFLOXACIN HCL 500 MG PO TABS: 500.0000 mg | ORAL_TABLET | Freq: Two times a day (BID) | ORAL | 0 refills | Status: DC

## 2017-01-02 DIAGNOSIS — J449 Chronic obstructive pulmonary disease, unspecified: Secondary | ICD-10-CM | POA: Diagnosis not present

## 2017-01-18 DIAGNOSIS — J449 Chronic obstructive pulmonary disease, unspecified: Secondary | ICD-10-CM | POA: Diagnosis not present

## 2017-01-30 DIAGNOSIS — J449 Chronic obstructive pulmonary disease, unspecified: Secondary | ICD-10-CM | POA: Diagnosis not present

## 2017-02-01 ENCOUNTER — Ambulatory Visit: Payer: Medicare Other | Admitting: Family

## 2017-02-03 ENCOUNTER — Other Ambulatory Visit: Payer: Self-pay | Admitting: Family Medicine

## 2017-02-07 NOTE — Progress Notes (Deleted)
Cardiology Office Note   Date:  02/07/2017   ID:  Logan West, DOB 09-Oct-1934, MRN [id number]  PCP:  Mechele Claude, MD  Cardiologist:   Rollene Rotunda, MD   No chief complaint on file.     History of Present Illness: Logan West is a 83 y.o. male who presents for follow up of atrial fibrillation.  Since I last saw him he has done well.  He has been seen in the atrial fib clinic and his rate is controlled.  He has still refused to take any anticoagulation mostly because of cough.    He does have a mildly reduced EF.   He has not wanted a right and left heart cath which I suggested.  He was on Eliquis.  ***  He has had hematuria but this resolved.  He has not wanted to be back on anticoagulation because of cost in part.    The patient denies any new symptoms such as chest discomfort, neck or arm discomfort. There has been no new shortness of breath, PND or orthopnea. There have been no reported palpitations, presyncope or syncope.  He does wear O2 at all times.   Past Medical History:  Diagnosis Date  . Atrial fibrillation (HCC)   . CHF (congestive heart failure) (HCC)   . Chronic pain   . COPD (chronic obstructive pulmonary disease) (HCC)   . Hyperlipidemia    x 7-8  . Hypertension    x30 years  . Obesity     Past Surgical History:  Procedure Laterality Date  . FRACTURE SURGERY     right forearm  . PERCUTANEOUS PINNING     right forearm fracture     Current Outpatient Prescriptions  Medication Sig Dispense Refill  . ASPIRIN ADULT LOW DOSE 81 MG EC tablet TAKE 1 TABLET (81 MG TOTAL) BY MOUTH DAILY. 30 tablet 1  . budesonide-formoterol (SYMBICORT) 160-4.5 MCG/ACT inhaler Inhale 2 puffs into the lungs 2 (two) times daily. 1 Inhaler 5  . ciprofloxacin (CIPRO) 500 MG tablet Take 1 tablet (500 mg total) by mouth 2 (two) times daily. For prostate. Take all of these. 30 tablet 0  . diltiazem (CARDIZEM CD) 180 MG 24 hr capsule Take 1 capsule (180 mg total) by mouth daily.  Need appointment for refills. 30 capsule 1  . furosemide (LASIX) 20 MG tablet Take 1 tablet (20 mg total) by mouth every other day. 30 tablet 11  . guaiFENesin (MUCINEX) 600 MG 12 hr tablet Take 2 tablets (1,200 mg total) by mouth 2 (two) times daily. 20 tablet 1  . LORazepam (ATIVAN) 1 MG tablet Take 1 tablet (1 mg total) by mouth every 8 (eight) hours as needed for anxiety. 60 tablet 5  . metoprolol (LOPRESSOR) 50 MG tablet TAKE 1 TABLET (50 MG TOTAL) BY MOUTH DAILY. 30 tablet 5  . pravastatin (PRAVACHOL) 40 MG tablet TAKE 1 TABLET (40 MG TOTAL) BY MOUTH DAILY. 30 tablet 11  . tamsulosin (FLOMAX) 0.4 MG CAPS capsule Take 1 capsule (0.4 mg total) by mouth daily. 30 capsule 5  . traMADol (ULTRAM) 50 MG tablet TAKE 1 TABLET EVERY 8 HOURS AS NEEDED FOR MODERATE PAIN 60 tablet 5   No current facility-administered medications for this visit.     Allergies:   Patient has no known allergies.    ROS:  Please see the history of present illness.   Otherwise, review of systems are positive for ***.   All other systems are reviewed and negative.  PHYSICAL EXAM: VS:  There were no vitals taken for this visit. , BMI There is no height or weight on file to calculate BMI. GENERAL:  Chronically ill-appearing HEENT:  Pupils equal round and reactive, fundi not visualized, oral mucosa unremarkable NECK:  No jugular venous distention, waveform within normal limits, carotid upstroke brisk and symmetric, no bruits, no thyromegaly LUNGS:  Decreased breath sounds bilaterally with few scattered crackles BACK:  No CVA tenderness CHEST:  Unremarkable HEART:  PMI not displaced or sustained,S1 and S2 within normal limits, no S3,  no clicks, no rubs, no murmurs, *** irregular ABD:  Flat, positive bowel sounds normal in frequency in pitch, no bruits, no rebound, no guarding, no midline pulsatile mass, no hepatomegaly, no splenomegaly EXT:  2 plus pulses throughout, mild edema, no cyanosis no clubbing   EKG:  EKG  is ***ordered today.   Recent Labs: 07/19/2016: Platelets 187 12/26/2016: ALT 16; BUN 19; Creatinine, Ser 0.81; Potassium 4.5; Sodium 146    Lipid Panel    Component Value Date/Time   CHOL 160 07/19/2016 1552   CHOL 142 05/28/2013 1050   TRIG 92 07/19/2016 1552   TRIG 93 05/28/2013 1050   HDL 50 07/19/2016 1552   HDL 38 (L) 05/28/2013 1050   CHOLHDL 3.2 07/19/2016 1552   LDLCALC 92 07/19/2016 1552   LDLCALC 85 05/28/2013 1050      Wt Readings from Last 3 Encounters:  12/26/16 214 lb (97.1 kg)  09/27/16 213 lb 3.2 oz (96.7 kg)  07/19/16 211 lb 9.6 oz (96 kg)      Other studies Reviewed: Additional studies/ records that were reviewed today include: *** Review of the above records demonstrates:  ***   ASSESSMENT AND PLAN:  ATRIAL FIB:    His heart rate seems to be controlled.  [name] has a CHA2DS2 - VASc score of 4 with a risk of stroke of 4%.  ***  CM:  The patient had slightly reduced ejection fraction.  He's not interested in right and left heart catheterization.  ***    Current medicines are reviewed at length with the patient today.  The patient does not have concerns regarding medicines.  The following changes have been made:  ***  Labs/ tests ordered today include:   ***    Disposition:   FU with me in *** months.    Signed, Rollene RotundaJames Kisha Messman, MD  02/07/2017 9:00 PM    [facility]

## 2017-02-08 ENCOUNTER — Ambulatory Visit: Payer: Self-pay | Admitting: Cardiology

## 2017-02-08 NOTE — Progress Notes (Signed)
Cardiology Office Note   Date:  02/10/2017   ID:  Logan West, DOB [birth year], MRN [id number]  PCP:  Mechele Claude, MD  Cardiologist:   Rollene Rotunda, MD   Chief Complaint  Patient presents with  . Atrial Fibrillation     History of Present Illness: Logan West is a 83 y.o. male who presents for follow up of atrial fibrillation.  Since I last saw him he has done well.  He has been seen in the atrial fib clinic and his rate is controlled.  He has still refused to take any anticoagulation mostly because of cough.    He does have a mildly reduced EF.   He has not wanted a right and left heart cath which I suggested.  He was on Eliquis.  He comes today and says he's been having some chest discomfort. He thinks this is been happening for about a week or so. However, there are some memory issues and he doesn't recall all the details. He described it as a discomfort under his left breast. He points across his chest. It might be 5 out of 10 in intensity. Seems to last for a few seconds at a time. He is limited in his activities. He has chronic COPD.  He does wear O2 at all times.   He's not describing any new fevers or chills. Does notice any palpitations, presyncope or syncope. He's had no weight gain or edema.  Past Medical History:  Diagnosis Date  . Atrial fibrillation (HCC)   . CHF (congestive heart failure) (HCC)   . Chronic pain   . COPD (chronic obstructive pulmonary disease) (HCC)   . Hyperlipidemia    x 7-8  . Hypertension    x30 years  . Obesity     Past Surgical History:  Procedure Laterality Date  . FRACTURE SURGERY     right forearm  . PERCUTANEOUS PINNING     right forearm fracture     Current Outpatient Prescriptions  Medication Sig Dispense Refill  . ASPIRIN ADULT LOW DOSE 81 MG EC tablet TAKE 1 TABLET (81 MG TOTAL) BY MOUTH DAILY. 30 tablet 1  . budesonide-formoterol (SYMBICORT) 160-4.5 MCG/ACT inhaler Inhale 2 puffs into the lungs 2 (two) times daily. 1  Inhaler 5  . ciprofloxacin (CIPRO) 500 MG tablet Take 500 mg by mouth every other day.    . diltiazem (CARDIZEM CD) 180 MG 24 hr capsule Take 1 capsule (180 mg total) by mouth daily. Need appointment for refills. 30 capsule 1  . furosemide (LASIX) 20 MG tablet Take 1 tablet (20 mg total) by mouth every other day. 30 tablet 11  . guaiFENesin (MUCINEX) 600 MG 12 hr tablet Take 2 tablets (1,200 mg total) by mouth 2 (two) times daily. 20 tablet 1  . LORazepam (ATIVAN) 1 MG tablet Take 1 tablet (1 mg total) by mouth every 8 (eight) hours as needed for anxiety. 60 tablet 5  . metoprolol (LOPRESSOR) 50 MG tablet TAKE 1 TABLET (50 MG TOTAL) BY MOUTH DAILY. 30 tablet 5  . pravastatin (PRAVACHOL) 40 MG tablet TAKE 1 TABLET (40 MG TOTAL) BY MOUTH DAILY. 30 tablet 11  . tamsulosin (FLOMAX) 0.4 MG CAPS capsule Take 1 capsule (0.4 mg total) by mouth daily. 30 capsule 5  . traMADol (ULTRAM) 50 MG tablet TAKE 1 TABLET EVERY 8 HOURS AS NEEDED FOR MODERATE PAIN 60 tablet 5   No current facility-administered medications for this visit.     Allergies:   Patient has  no known allergies.    ROS:  Please see the history of present illness.   Otherwise, review of systems are positive for none.   All other systems are reviewed and negative.    PHYSICAL EXAM: VS:  BP 135/82 (BP Location: Right Arm)   Pulse 85   Ht 5\' 8"  (1.727 m)   Wt 212 lb 9.6 oz (96.4 kg)   BMI 32.33 kg/m  , BMI Body mass index is 32.33 kg/m. GENERAL:  Chronically ill-appearing but in no distress. HEENT:  Pupils equal round and reactive, fundi not visualized, oral mucosa unremarkable NECK:  No jugular venous distention, waveform within normal limits, carotid upstroke brisk and symmetric, no bruits, no thyromegaly LUNGS:  Decreased breath sounds bilaterally with few scattered crackles BACK:  No CVA tenderness CHEST:  Unremarkable HEART:  PMI not displaced or sustained,S1 and S2 within normal limits, no S3,  no clicks, no rubs, no murmurs,   irregular ABD:  Flat, positive bowel sounds normal in frequency in pitch, no bruits, no rebound, no guarding, no midline pulsatile mass, no hepatomegaly, no splenomegaly EXT:  2 plus pulses throughout, mild edema, no cyanosis no clubbing   EKG:  EKG is ordered today.  Atrial fibrillation, rate 85, axis within normal limits, intervals within normal limits, no acute ST-T.   Recent Labs: 07/19/2016: Platelets 187 12/26/2016: ALT 16; BUN 19; Creatinine, Ser 0.81; Potassium 4.5; Sodium 146    Lipid Panel    Component Value Date/Time   CHOL 160 07/19/2016 1552   CHOL 142 05/28/2013 1050   TRIG 92 07/19/2016 1552   TRIG 93 05/28/2013 1050   HDL 50 07/19/2016 1552   HDL 38 (L) 05/28/2013 1050   CHOLHDL 3.2 07/19/2016 1552   LDLCALC 92 07/19/2016 1552   LDLCALC 85 05/28/2013 1050      Wt Readings from Last 3 Encounters:  02/09/17 212 lb 9.6 oz (96.4 kg)  12/26/16 214 lb (97.1 kg)  09/27/16 213 lb 3.2 oz (96.7 kg)      Other studies Reviewed: Additional studies/ records that were reviewed today include: None Review of the above records demonstrates:     ASSESSMENT AND PLAN:  ATRIAL FIB:    His heart rate seems to be controlled.  Mr. Deklan Minar has a CHA2DS2 - VASc score of 4 with a risk of stroke of 4%.  Again I discussed this with him and he does not want anticoagulation.   CM:  The patient had slightly reduced ejection fraction.  He's not interested in right and left heart catheterization.  We talked about this again.  He is not interested in this.    CHEST PAIN:     his pain is atypical and it seems to have gone away. He won't allow any further cardiovascular testing. At this point he'll continue with meds as listed.   I did suggest a cardiac cath and discussed the risk/benefit in detail.  He still does not want this and his family will try to talk to him about it further.  He should go to the ED with any increasing pain.  I will make sure that he has SLNTG.   DECREASED  MEMORY:  They were interested in dimension testing and I suggested follow-up with  Dr. Darlyn Read to discuss possibly a neurology referral.      Current medicines are reviewed at length with the patient today.  The patient does not have concerns regarding medicines.  The following changes have been made:  None  Labs/ tests ordered today include:   None    Disposition:   FU with me in 6 months.    Signed, Rollene RotundaJames Tena Linebaugh, MD  02/10/2017 8:03 AM    [facility]

## 2017-02-09 ENCOUNTER — Encounter: Payer: Self-pay | Admitting: Cardiology

## 2017-02-09 ENCOUNTER — Ambulatory Visit (INDEPENDENT_AMBULATORY_CARE_PROVIDER_SITE_OTHER): Payer: Medicare Other | Admitting: Cardiology

## 2017-02-09 VITALS — BP 135/82 | HR 85 | Ht 68.0 in | Wt 212.6 lb

## 2017-02-09 DIAGNOSIS — I10 Essential (primary) hypertension: Secondary | ICD-10-CM | POA: Diagnosis not present

## 2017-02-09 DIAGNOSIS — I429 Cardiomyopathy, unspecified: Secondary | ICD-10-CM

## 2017-02-09 DIAGNOSIS — I482 Chronic atrial fibrillation, unspecified: Secondary | ICD-10-CM

## 2017-02-09 DIAGNOSIS — R079 Chest pain, unspecified: Secondary | ICD-10-CM | POA: Diagnosis not present

## 2017-02-09 NOTE — Patient Instructions (Signed)

## 2017-02-10 ENCOUNTER — Encounter: Payer: Self-pay | Admitting: Cardiology

## 2017-02-10 MED ORDER — NITROGLYCERIN 0.4 MG SL SUBL: 0.4000 mg | SUBLINGUAL_TABLET | SUBLINGUAL | 3 refills | Status: DC | PRN

## 2017-02-10 NOTE — Addendum Note (Signed)
Addended by: Barrie DunkerHOMAS, NYASHA N on: 02/10/2017 08:17 AM   Modules accepted: Orders

## 2017-02-15 DIAGNOSIS — J449 Chronic obstructive pulmonary disease, unspecified: Secondary | ICD-10-CM | POA: Diagnosis not present

## 2017-02-18 ENCOUNTER — Other Ambulatory Visit (HOSPITAL_COMMUNITY): Payer: Self-pay | Admitting: Family Medicine

## 2017-03-02 DIAGNOSIS — J449 Chronic obstructive pulmonary disease, unspecified: Secondary | ICD-10-CM | POA: Diagnosis not present

## 2017-03-09 IMAGING — CR DG CHEST 1V PORT
1 series · 1 of 1 positions shown · non-contrast
Comparison: 09/08/2015

CLINICAL DATA: Respiratory distress status post intubation.

EXAM:
PORTABLE CHEST 1 VIEW

[ap portable]
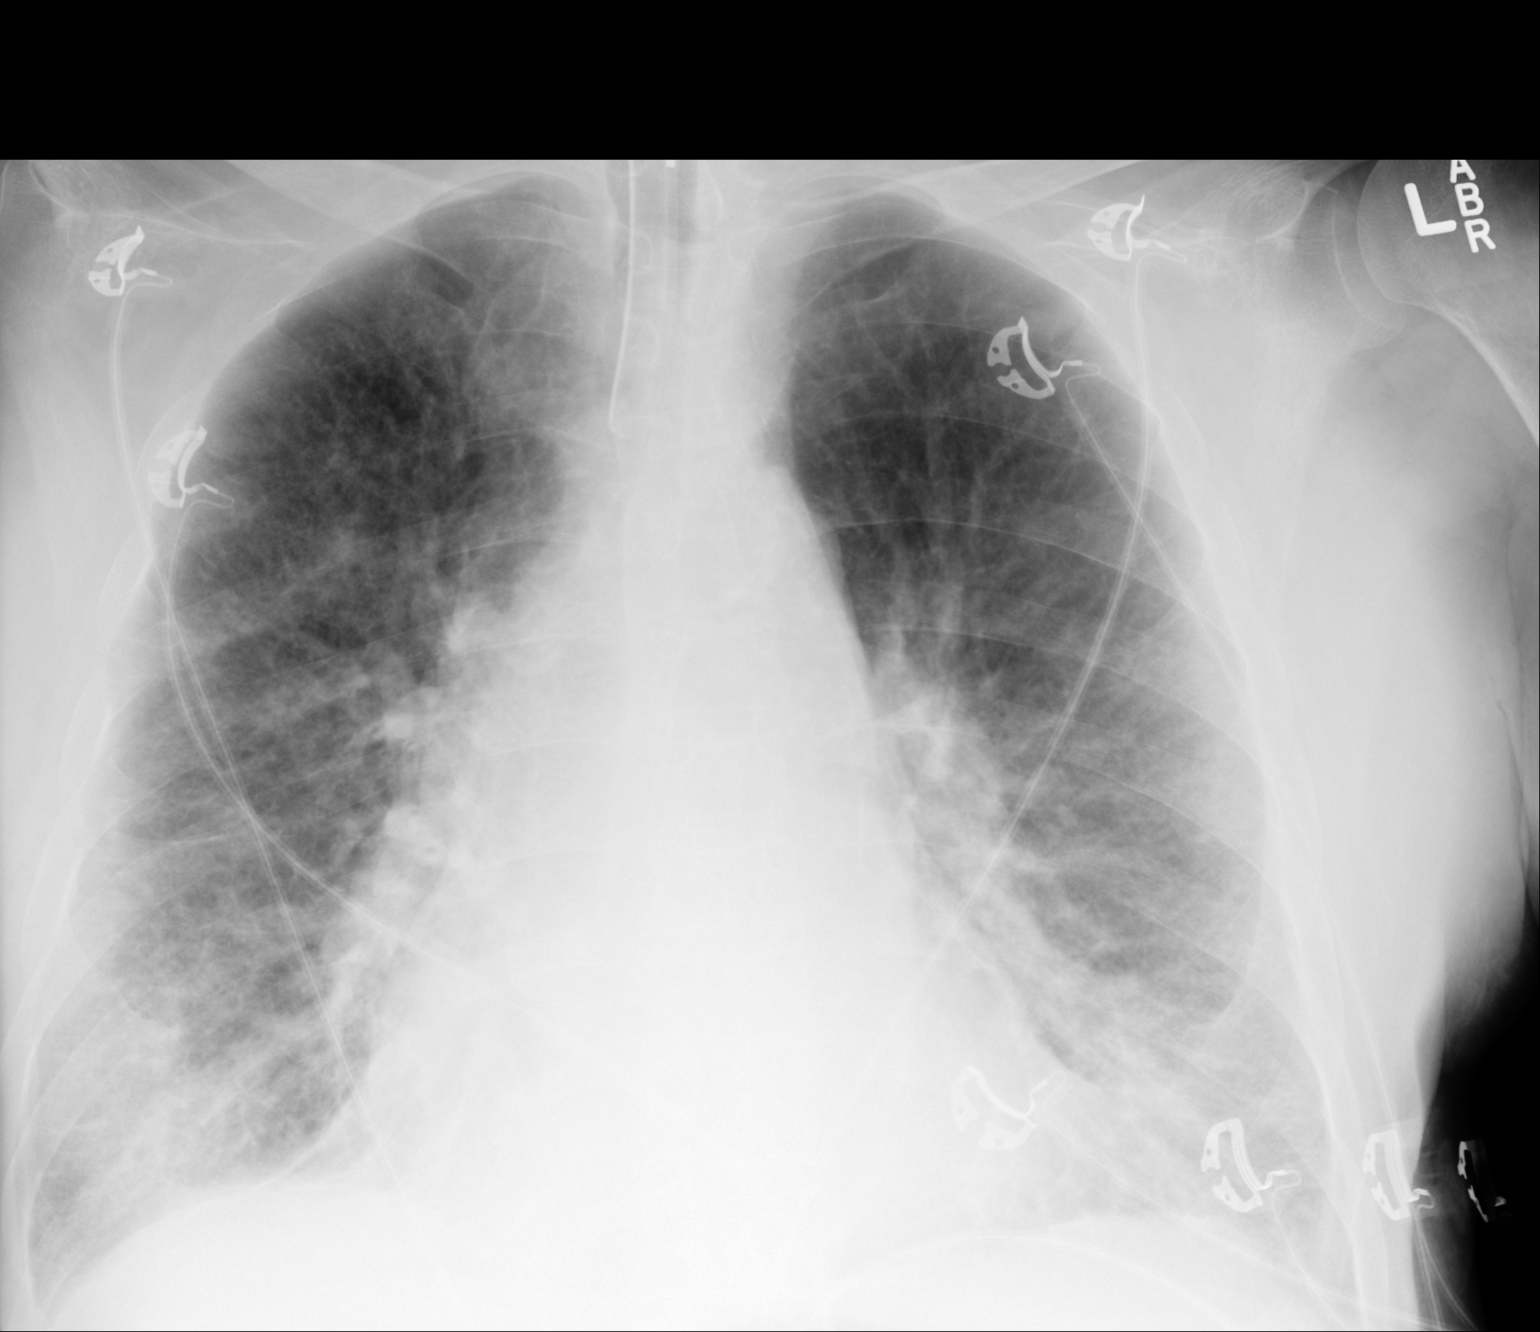

[1 of 1 positions shown; findings below may reference images not displayed]

FINDINGS: Endotracheal tube overlies the tracheal air column and terminates
less than 1 cm superior to the carina.

The cardiac silhouette is normal. Mediastinal contours appear intact
considering portable technique.

There is no evidence of pleural effusion or pneumothorax. There are
bilateral lower lobe predominant patchy airspace opacities

Osseous structures are without acute abnormality. Soft tissues are
grossly normal.
IMPRESSION: Status post intubation with endotracheal tube terminating less than
a cm from the carina.

Bilateral patchy airspace consolidation with lower lobe
predominance. These may represent developing pulmonary edema or
multifocal pneumonia.

## 2017-03-10 IMAGING — CR DG CHEST 1V PORT
1 series · 1 of 1 positions shown · non-contrast
Comparison: Chest x-rays dated 11/28/2015 and 08/27/2015.

CLINICAL DATA: Respiratory failure

EXAM:
PORTABLE CHEST 1 VIEW

[ap portable]
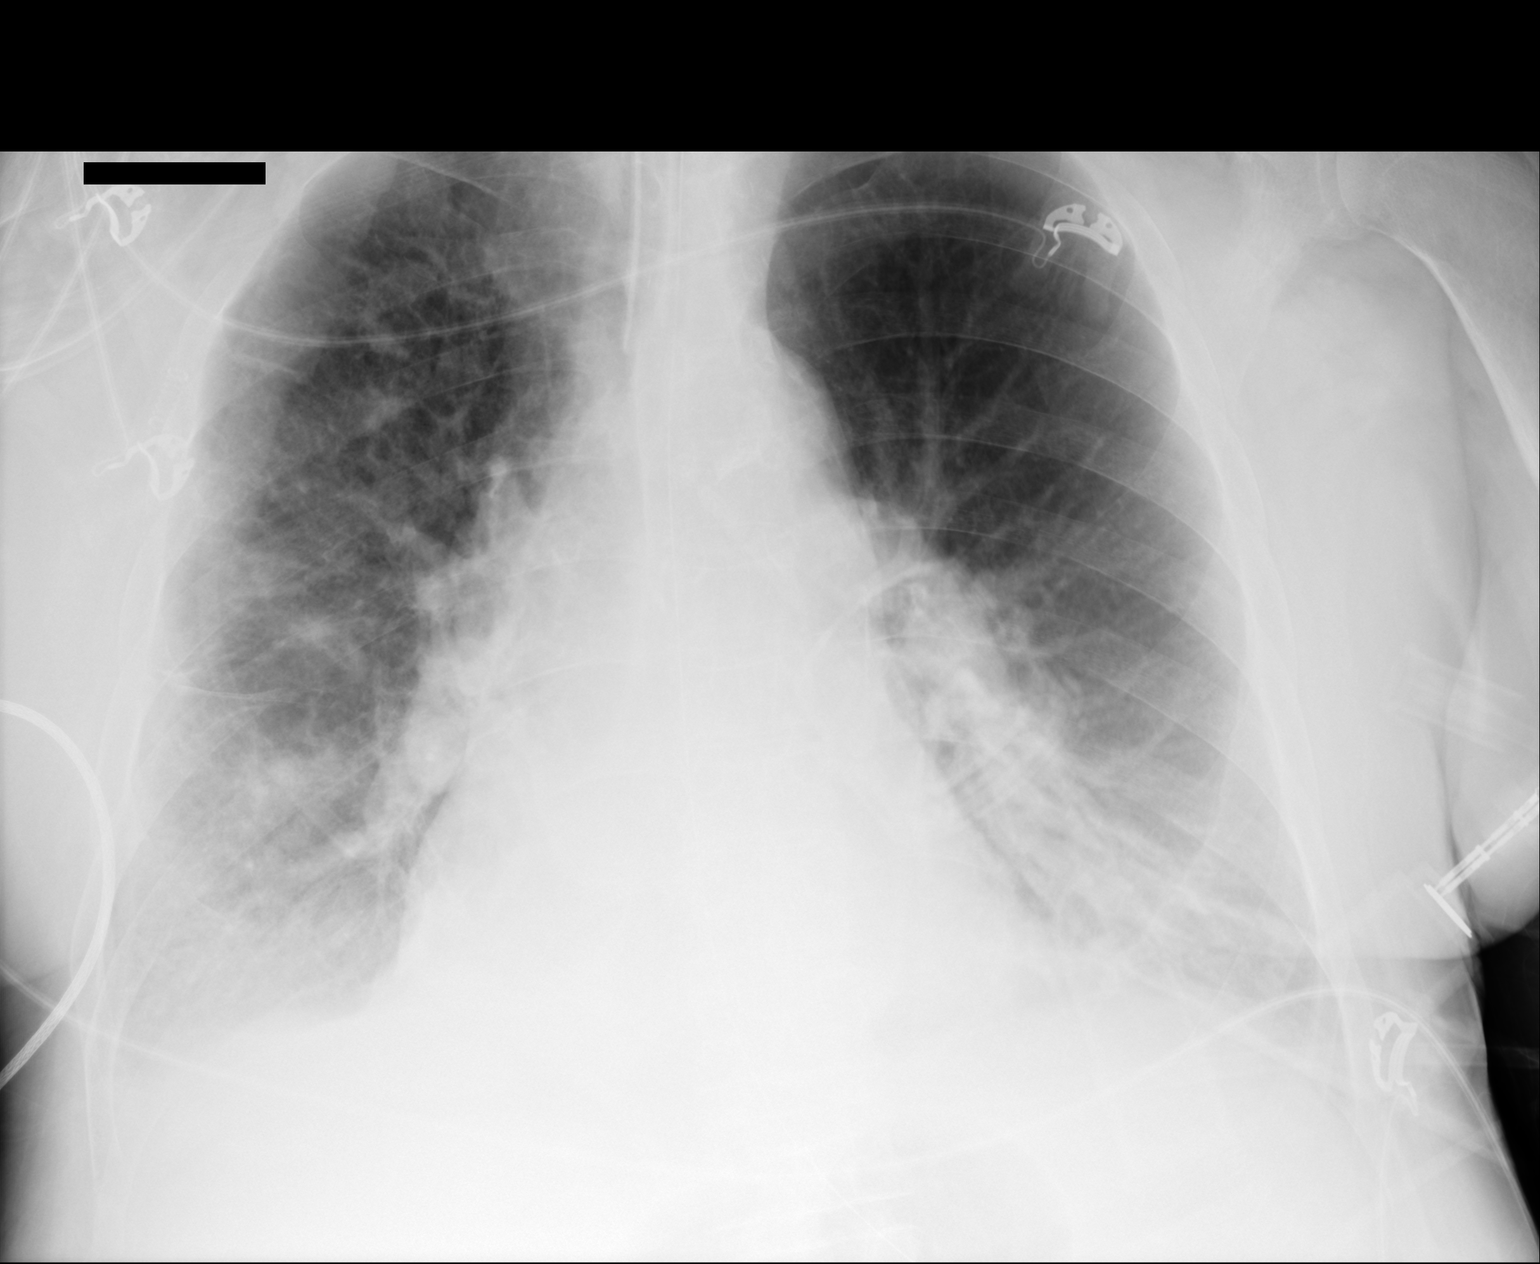

[1 of 1 positions shown; findings below may reference images not displayed]

FINDINGS: Endotracheal tube remains well positioned with tip just above the
level of the carina. Enteric tube passes below the diaphragm.

Mild cardiomegaly is unchanged. Overall cardiomediastinal silhouette
is stable in size and configuration.

Central pulmonary vascular congestion persists with mild bilateral
interstitial edema, unchanged compared to yesterday's exam,
significantly worsened compared to exam of 09/08/2015. Some
component of the perihilar opacities likely related to chronic
underlying pulmonary artery hypertension. Suspect additional mild
bibasilar atelectasis and/or small pleural effusions. No new lung
abnormality.
IMPRESSION: 1. Overall stable appearance compared to chest x-ray of 11/28/2015.
2. Persistent central pulmonary vascular congestion and mild
bilateral interstitial edema, not significantly changed in the
short-term interval, suggesting mild volume overload/CHF. Patchy
bibasilar airspace opacities are also unchanged, compatible with
either atelectasis or pneumonia, favor atelectasis. Also suspect
small bilateral pleural effusions.
3. Some component of the perihilar opacities likely related to
chronic pulmonary artery enlargement caused by underlying pulmonary
artery hypertension.
4. Endotracheal tube remains well positioned with tip just above the
level of the carina.

## 2017-03-11 IMAGING — CR DG CHEST 1V PORT
1 series · 1 of 1 positions shown · non-contrast
Comparison: Yesterday

CLINICAL DATA: Respiratory failure

EXAM:
PORTABLE CHEST 1 VIEW

[ap portable]
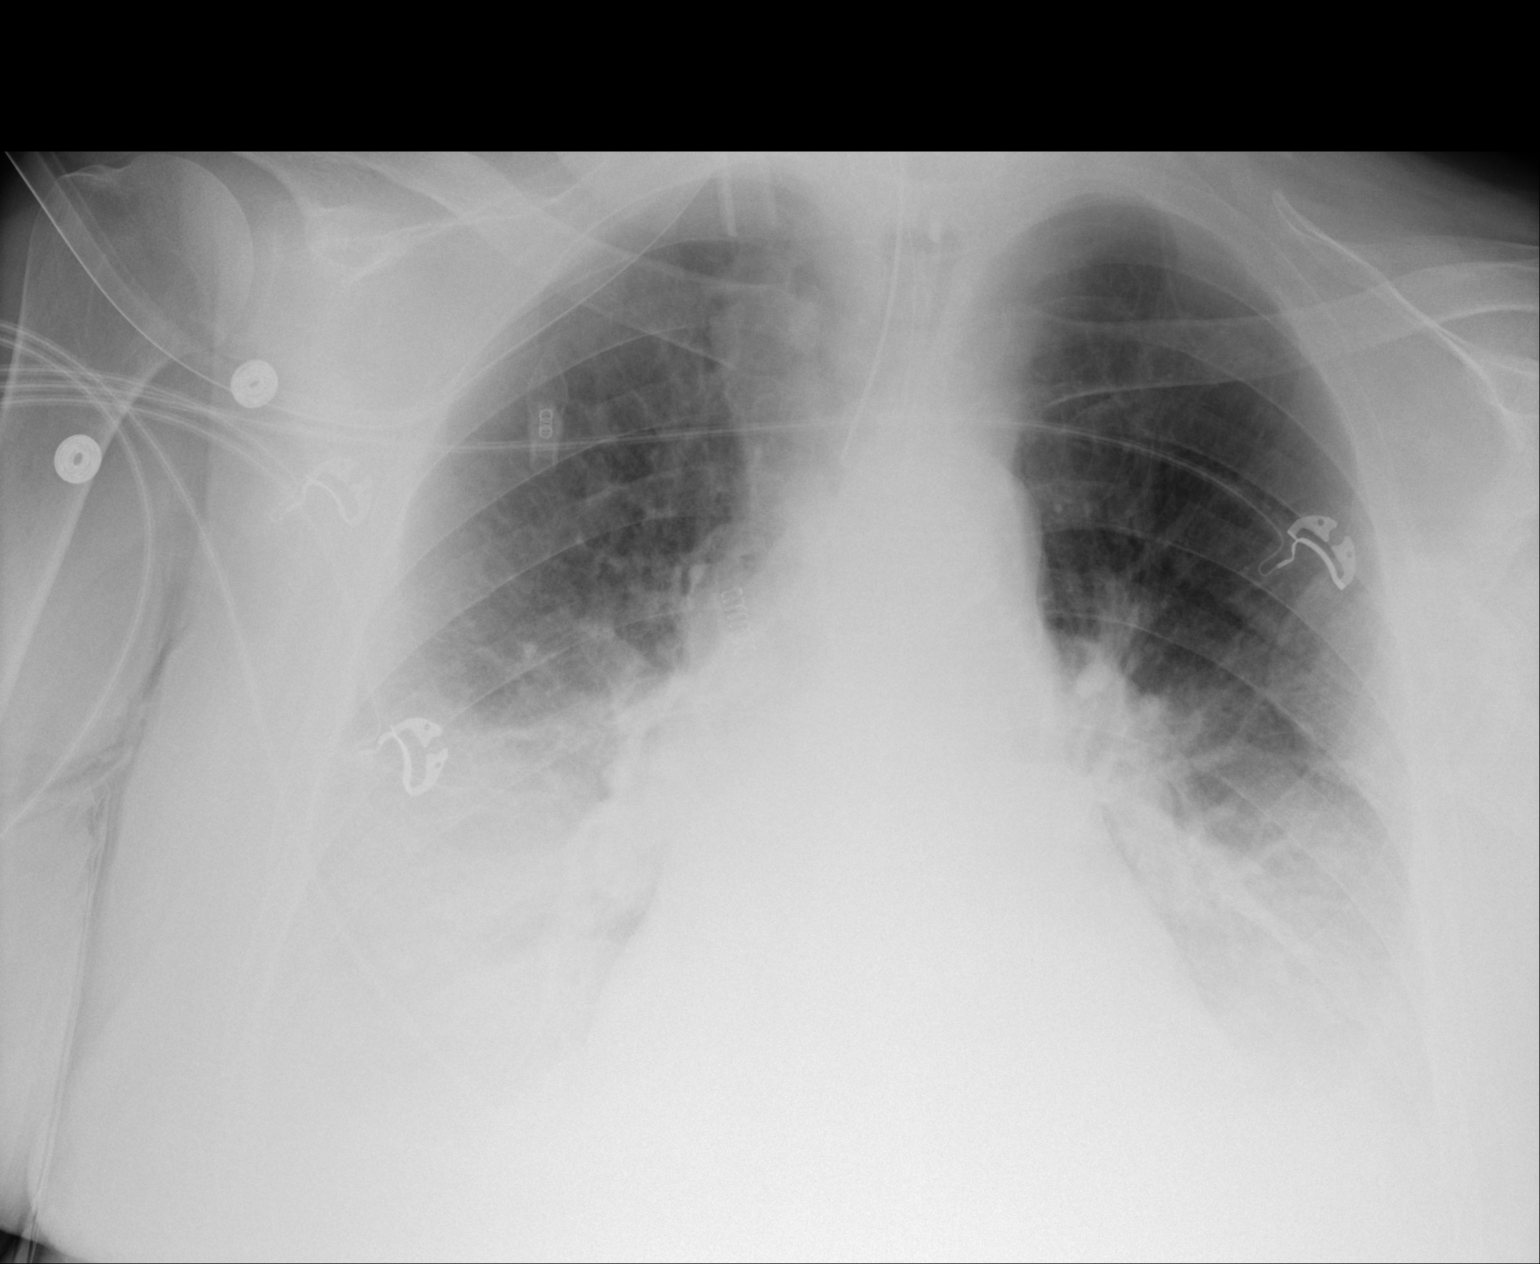

[1 of 1 positions shown; findings below may reference images not displayed]

FINDINGS: Endotracheal tube tip is between the clavicular heads and carina.
Gastric suction tube is not visible below the thoracic inlet due to
underpenetration.

Increasing hazy basilar opacities, likely atelectasis and pleural
fluid. Apparent increase could be from dependent flow. There could
be underlying airspace opacity. No pulmonary edema seen in the
apical lungs. Normal heart size and stable mediastinal contours.
IMPRESSION: 1. Hazy basilar opacities favoring atelectasis and layering pleural
fluid.
2. Endotracheal tube remains in good position. Most of the
orogastric tube is not visible due to technical factors.

## 2017-03-12 IMAGING — CR DG CHEST 1V PORT
1 series · 1 of 1 positions shown · non-contrast
Comparison: Prior chest x-ray 11/30/2015

CLINICAL DATA: 81-year-old male currently ventilated

EXAM:
PORTABLE CHEST 1 VIEW

[ap]
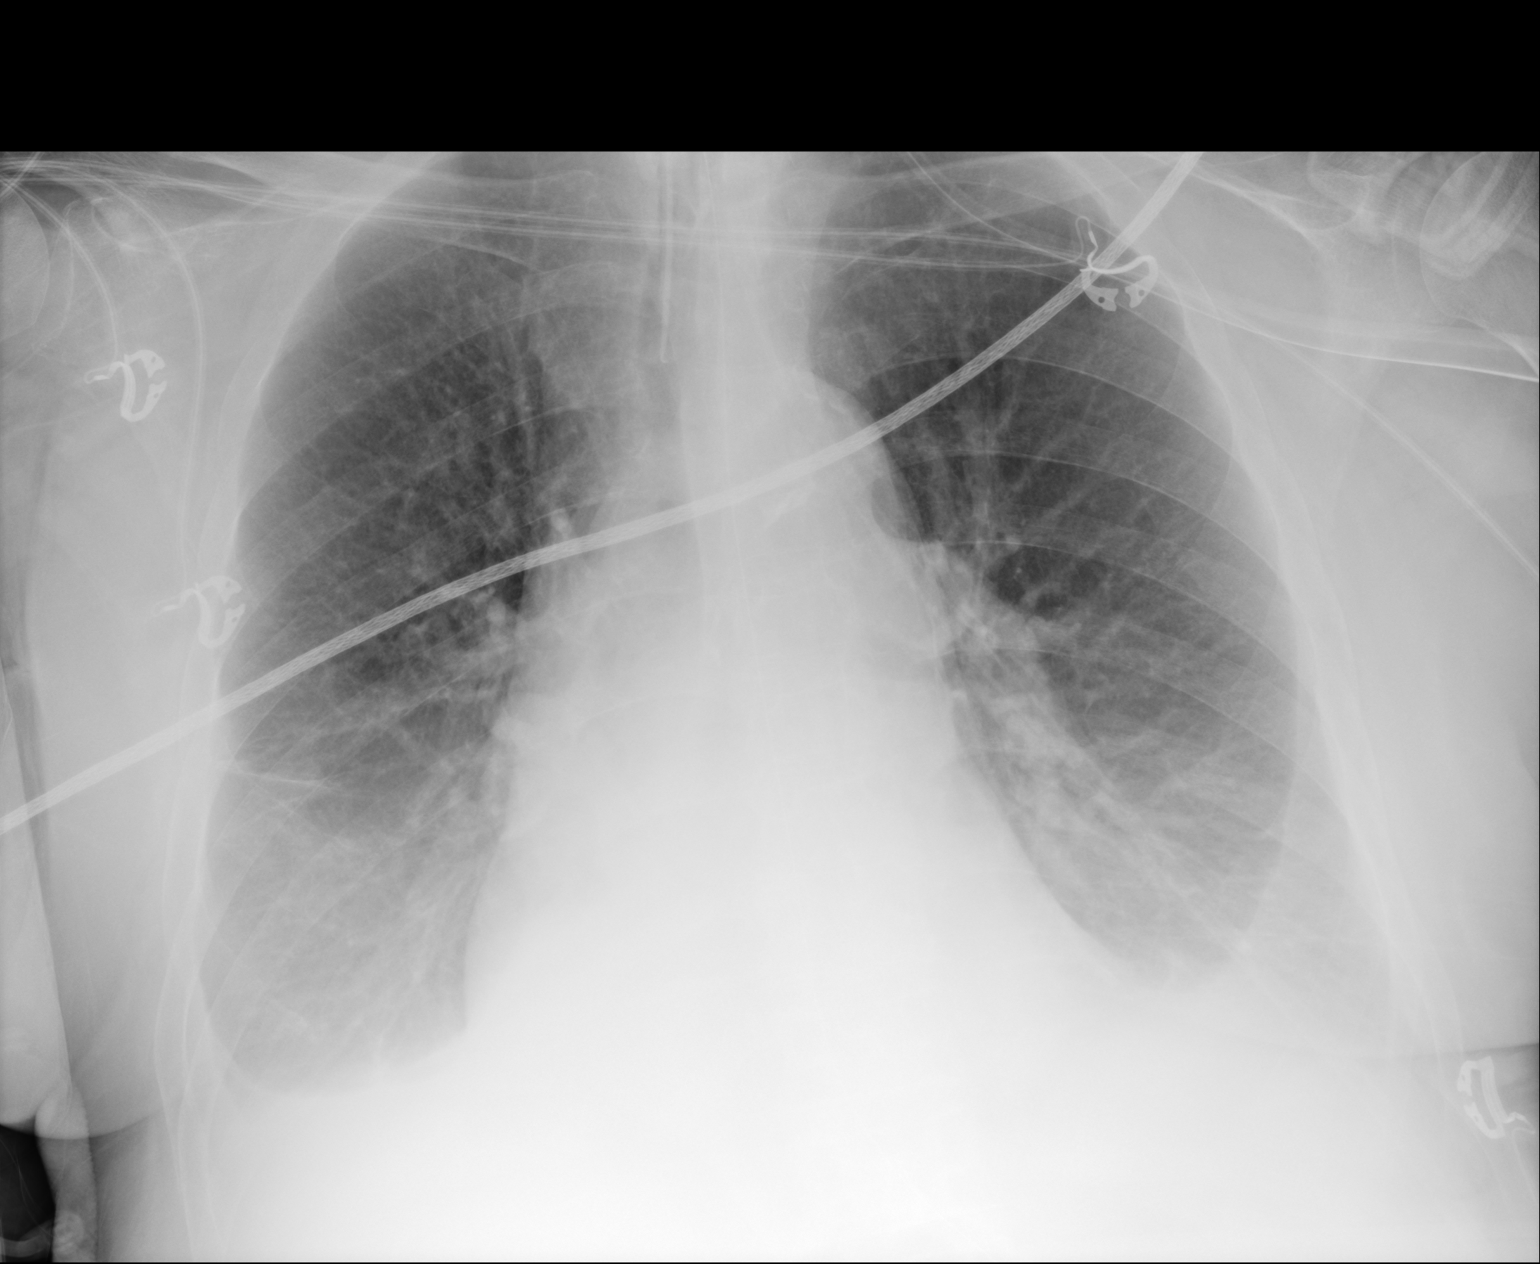

[1 of 1 positions shown; findings below may reference images not displayed]

FINDINGS: The endotracheal tube is 6.2 cm above the carina. A nasogastric tube
is present, the tip is not imaged but lies below the diaphragm
likely within the stomach.

Stable cardiac and mediastinal contours. Atherosclerotic
calcifications again noted in the transverse aorta. Improved
aeration with decreased bibasilar opacities likely reflecting
decreasing atelectasis. There are persistent bilateral moderate
layering pleural effusions. No pulmonary edema. No pneumothorax. No
acute osseous abnormality.
IMPRESSION: 1. Improved aeration in both lung bases likely secondary to
decreasing atelectasis.
2. Persistent bilateral moderate layering pleural effusions.
3. Stable and satisfactory support apparatus.

## 2017-03-14 IMAGING — CR DG CHEST 1V PORT
1 series · 1 of 1 positions shown · non-contrast
Comparison: One day prior

CLINICAL DATA: Respiratory failure.

EXAM:
PORTABLE CHEST 1 VIEW

[ap portable]
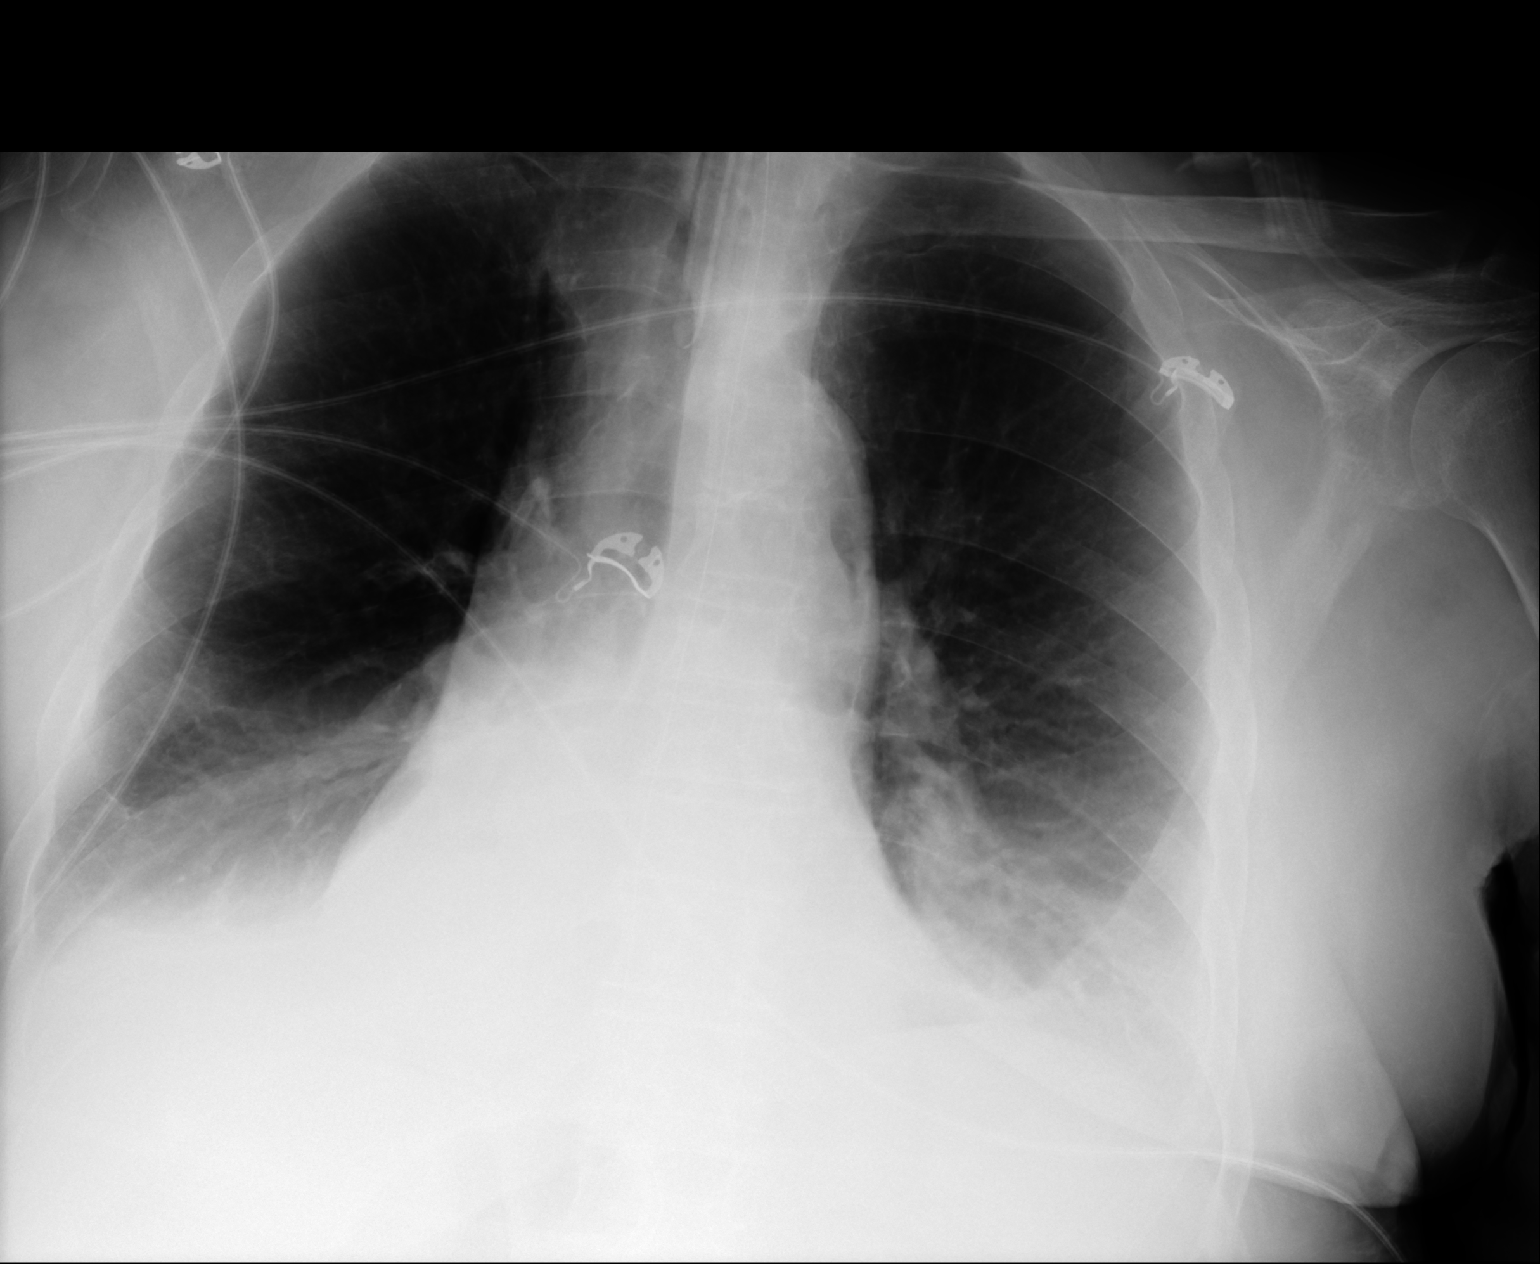

[1 of 1 positions shown; findings below may reference images not displayed]

FINDINGS: Endotracheal tube terminates 5.8 cm above carina.Mildly degraded
exam due to AP portable technique and patient body habitus.
Nasogastric poorly visualized distally. The upper lungs are
overpenetrated. Small bilateral pleural effusions persist. Limited
evaluation for pneumothorax. Cardiomegaly. Similar bibasilar
airspace disease.
IMPRESSION: Decreased sensitivity and specificity exam due to technique related
factors, as described above.

Similar small bilateral pleural effusions with bibasilar airspace
opacities.

Cardiomegaly without congestive failure.

## 2017-03-18 DIAGNOSIS — J449 Chronic obstructive pulmonary disease, unspecified: Secondary | ICD-10-CM | POA: Diagnosis not present

## 2017-04-01 DIAGNOSIS — J449 Chronic obstructive pulmonary disease, unspecified: Secondary | ICD-10-CM | POA: Diagnosis not present

## 2017-04-17 DIAGNOSIS — J449 Chronic obstructive pulmonary disease, unspecified: Secondary | ICD-10-CM | POA: Diagnosis not present

## 2017-04-22 ENCOUNTER — Other Ambulatory Visit: Payer: Self-pay | Admitting: Family Medicine

## 2017-04-24 ENCOUNTER — Telehealth: Payer: Self-pay | Admitting: Family Medicine

## 2017-04-24 NOTE — Telephone Encounter (Signed)
Pharmacy advised Dr Darlyn ReadStacks is aware pt is on Tramadol and Ativan and ok'd them to fill.

## 2017-05-02 DIAGNOSIS — J449 Chronic obstructive pulmonary disease, unspecified: Secondary | ICD-10-CM | POA: Diagnosis not present

## 2017-05-03 ENCOUNTER — Telehealth: Payer: Self-pay | Admitting: Family Medicine

## 2017-05-03 NOTE — Telephone Encounter (Signed)
Wife says he pulls it off while resting.  Advise to possibly tape to ear at bedtime?

## 2017-05-17 ENCOUNTER — Ambulatory Visit (INDEPENDENT_AMBULATORY_CARE_PROVIDER_SITE_OTHER): Payer: Medicare Other | Admitting: *Deleted

## 2017-05-17 VITALS — BP 124/73 | HR 96 | Ht 66.0 in | Wt 206.0 lb

## 2017-05-17 DIAGNOSIS — Z23 Encounter for immunization: Secondary | ICD-10-CM | POA: Diagnosis not present

## 2017-05-17 DIAGNOSIS — Z0000 Encounter for general adult medical examination without abnormal findings: Secondary | ICD-10-CM | POA: Diagnosis not present

## 2017-05-17 DIAGNOSIS — J449 Chronic obstructive pulmonary disease, unspecified: Secondary | ICD-10-CM

## 2017-05-17 NOTE — Patient Instructions (Addendum)
[name] , Thank you for taking time to come for your Medicare Wellness Visit. I appreciate your ongoing commitment to your health goals. Please review the following plan we discussed and let me know if I can assist you in the future.   These are the goals we discussed: Goals    . Exercise 150 minutes per week (moderate activity)          Chair exercises daily and walk as tolerated for at least 10 minutes a day       This is a list of the screening recommended for you and due dates:  Health Maintenance  Topic Date Due  . Tetanus Vaccine  04/04/1953  . Pneumonia vaccines (2 of 2 - PCV13) 11/30/2016  . Flu Shot  06/21/2017   neumococcal Vaccine, Polyvalent solution for injection What is this medicine? PNEUMOCOCCAL VACCINE, POLYVALENT (NEU mo KOK al vak SEEN, pol ee VEY luhnt) is a vaccine to prevent pneumococcus bacteria infection. These bacteria are a major cause of ear infections, Strep throat infections, and serious pneumonia, meningitis, or blood infections worldwide. These vaccines help the body to produce antibodies (protective substances) that help your body defend against these bacteria. This vaccine is recommended for people 512 years of age and older with health problems. It is also recommended for all adults over 81 years old. This vaccine will not treat an infection. This medicine may be used for other purposes; ask your health care provider or pharmacist if you have questions. COMMON BRAND NAME(S): Pneumovax 23 What should I tell my health care provider before I take this medicine? They need to know if you have any of these conditions: -bleeding problems -bone marrow or organ transplant -cancer, Hodgkin's disease -fever -infection -immune system problems -low platelet count in the blood -seizures -an unusual or allergic reaction to pneumococcal vaccine, diphtheria toxoid, other vaccines, latex, other medicines, foods, dyes, or preservatives -pregnant or trying to get  pregnant -breast-feeding How should I use this medicine? This vaccine is for injection into a muscle or under the skin. It is given by a health care professional. A copy of Vaccine Information Statements will be given before each vaccination. Read this sheet carefully each time. The sheet may change frequently. Talk to your pediatrician regarding the use of this medicine in children. While this drug may be prescribed for children as young as 672 years of age for selected conditions, precautions do apply. Overdosage: If you think you have taken too much of this medicine contact a poison control center or emergency room at once. NOTE: This medicine is only for you. Do not share this medicine with others. What if I miss a dose? It is important not to miss your dose. Call your doctor or health care professional if you are unable to keep an appointment. What may interact with this medicine? -medicines for cancer chemotherapy -medicines that suppress your immune function -medicines that treat or prevent blood clots like warfarin, enoxaparin, and dalteparin -steroid medicines like prednisone or cortisone This list may not describe all possible interactions. Give your health care provider a list of all the medicines, herbs, non-prescription drugs, or dietary supplements you use. Also tell them if you smoke, drink alcohol, or use illegal drugs. Some items may interact with your medicine. What should I watch for while using this medicine? Mild fever and pain should go away in 3 days or less. Report any unusual symptoms to your doctor or health care professional. What side effects may I notice  from receiving this medicine? Side effects that you should report to your doctor or health care professional as soon as possible: -allergic reactions like skin rash, itching or hives, swelling of the face, lips, or tongue -breathing problems -confused -fever over 102 degrees F -pain, tingling, numbness in the hands  or feet -seizures -unusual bleeding or bruising -unusual muscle weakness Side effects that usually do not require medical attention (report to your doctor or health care professional if they continue or are bothersome): -aches and pains -diarrhea -fever of 102 degrees F or less -headache -irritable -loss of appetite -pain, tender at site where injected -trouble sleeping This list may not describe all possible side effects. Call your doctor for medical advice about side effects. You may report side effects to FDA at 1-800-FDA-1088. Where should I keep my medicine? This does not apply. This vaccine is given in a clinic, pharmacy, doctor's office, or other health care setting and will not be stored at home. NOTE: This sheet is a summary. It may not cover all possible information. If you have questions about this medicine, talk to your doctor, pharmacist, or health care provider.  2018 Elsevier/Gold Standard (2008-06-13 14:32:37)

## 2017-05-18 DIAGNOSIS — J449 Chronic obstructive pulmonary disease, unspecified: Secondary | ICD-10-CM | POA: Diagnosis not present

## 2017-05-18 NOTE — Progress Notes (Addendum)
Subjective:   Logan West is a 83 y.o. male who presents for an Initial Medicare Annual Wellness Visit. Mr Ator is accompanied by his daughter, Logan West. He lives at home with his wife and another daughter and her boyfriend.  Review of Systems  Reports that health is about the same as last year.   Cardiac Risk Factors include: advanced age (>23men, >45 women);dyslipidemia;male gender;hypertension;smoking/ tobacco exposure;sedentary lifestyle;obesity (BMI >30kg/m2)  Pysch: Daughter and patient report episodes of hallucinations. Most of the time these happen at night and they are sometimes scary. These started when he was hospitalized in 11/2015.   Other systems negative   Objective:    BP 124/73 (BP Location: Right Arm, Patient Position: Sitting, Cuff Size: Normal)   Pulse 96   Ht 5\' 6"  (1.676 m)   Wt 206 lb (93.4 kg)   SpO2 (!) 87% Comment: on RA ambulating  BMI 33.25 kg/m   SpO2 at rest on room air 94% SpO2 with exercise on room air 87% SpO2 on 2l/m 02 with exercise 92%  Body mass index is 33.25 kg/m.  Current Medications (verified) Outpatient Encounter Prescriptions as of 05/17/2017  Medication Sig  . aspirin 81 MG EC tablet TAKE 1 TABLET (81 MG TOTAL) BY MOUTH DAILY.  . budesonide-formoterol (SYMBICORT) 160-4.5 MCG/ACT inhaler Inhale 2 puffs into the lungs 2 (two) times daily.  Marland Kitchen diltiazem (CARDIZEM CD) 180 MG 24 hr capsule Take 1 capsule (180 mg total) by mouth daily. Need appointment for refills.  . furosemide (LASIX) 20 MG tablet Take 1 tablet (20 mg total) by mouth every other day.  Marland Kitchen guaiFENesin (MUCINEX) 600 MG 12 hr tablet Take 2 tablets (1,200 mg total) by mouth 2 (two) times daily.  Marland Kitchen LORazepam (ATIVAN) 1 MG tablet Take 1 tablet (1 mg total) by mouth every 8 (eight) hours as needed for anxiety.  . metoprolol (LOPRESSOR) 50 MG tablet TAKE 1 TABLET (50 MG TOTAL) BY MOUTH DAILY.  . pravastatin (PRAVACHOL) 40 MG tablet TAKE 1 TABLET (40 MG TOTAL) BY MOUTH DAILY.  .  tamsulosin (FLOMAX) 0.4 MG CAPS capsule Take 1 capsule (0.4 mg total) by mouth daily.  . traMADol (ULTRAM) 50 MG tablet TAKE 1 TABLET EVERY 8 HOURS AS NEEDED FOR MODERATE PAIN  . [DISCONTINUED] ciprofloxacin (CIPRO) 500 MG tablet Take 500 mg by mouth every other day.  . [DISCONTINUED] diltiazem (CARDIZEM CD) 180 MG 24 hr capsule Take 1 capsule (180 mg total) by mouth daily.  . nitroGLYCERIN (NITROSTAT) 0.4 MG SL tablet Place 1 tablet (0.4 mg total) under the tongue every 5 (five) minutes as needed for chest pain.   No facility-administered encounter medications on file as of 05/17/2017.     Allergies (verified) Patient has no known allergies.   History: Past Medical History:  Diagnosis Date  . Atrial fibrillation (HCC)   . CHF (congestive heart failure) (HCC)   . Chronic pain   . COPD (chronic obstructive pulmonary disease) (HCC)   . Hyperlipidemia    x 7-8  . Hypertension    x30 years  . Obesity    Past Surgical History:  Procedure Laterality Date  . FRACTURE SURGERY     right forearm  . PERCUTANEOUS PINNING     right forearm fracture   Family History  Problem Relation Age of Onset  . Cancer Mother   . Asthma Father   . Tuberculosis Father   . Heart attack Brother        same mother.  MI in his  20s  . Coronary artery disease Other   . COPD Daughter   . Thyroid disease Daughter    Social History   Occupational History  . Not on file.   Social History Main Topics  . Smoking status: Former Smoker    Packs/day: 2.50    Years: 25.00    Types: Cigarettes    Quit date: 02/23/1991  . Smokeless tobacco: Never Used  . Alcohol use No  . Drug use: No  . Sexual activity: No   Tobacco Counseling No current tobacco use  Activities of Daily Living In your present state of health, do you have any difficulty performing the following activities: 05/17/2017 07/19/2016  Hearing? Y N  Vision? N N  Difficulty concentrating or making decisions? N N  Walking or climbing stairs?  N N  Dressing or bathing? N N  Doing errands, shopping? Y N  Preparing Food and eating ? Y -  Using the Toilet? N -  In the past six months, have you accidently leaked urine? Y -  Do you have problems with loss of bowel control? N -  Managing your Medications? N -  Managing your Finances? N -  Housekeeping or managing your Housekeeping? Y -  Some recent data might be hidden  Has 8 steps into his house. Uses handrails. Has help with housekeeping.  Some stress incontinence.   Immunizations and Health Maintenance Immunization History  Administered Date(s) Administered  . Influenza,inj,Quad PF,36+ Mos 09/11/2014, 08/04/2015, 09/27/2016  . Pneumococcal Polysaccharide-23 12/01/2015, 05/17/2017   Health Maintenance Due  Topic Date Due  . Janet BerlinETANUS/TDAP  04/04/1953    Patient Care Team: Mechele ClaudeStacks, Warren, MD as PCP - General (Family Medicine) Rollene RotundaHochrein, James, MD as Consulting Physician (Cardiology)  No hospitalizations, ER visits, and surgeries this past year.    Assessment:   This is a routine wellness examination for [name].   Hearing/Vision screen Some hearing deficits noted during visit. Last eye exam was in 2016.  Dietary issues and exercise activities discussed: Current Exercise Habits: Home exercise routine, Type of exercise: walking, Time (Minutes): 10, Frequency (Times/Week): 7, Weekly Exercise (Minutes/Week): 70, Intensity: Mild  Goals    . Exercise 150 minutes per week (moderate activity)          Chair exercises daily and walk as tolerated for at least 10 minutes a day      Diet: 2 meals a day. Mostly prepackaged and frozen meals that his daughter, Logan RongKris provides.  Depression Screen PHQ 2/9 Scores 05/17/2017 12/26/2016 09/27/2016 07/19/2016  PHQ - 2 Score 0 0 0 0  PHQ- 9 Score - - - -    Fall Risk Fall Risk  05/17/2017 12/26/2016 09/27/2016 07/19/2016 04/07/2016  Falls in the past year? Yes No No No No  Number falls in past yr: 2 or more - - - -  Injury with Fall? No -  - - -  Risk for fall due to : History of fall(s);Impaired balance/gait - - - -    Cognitive Function: MMSE - Mini Mental State Exam 05/17/2017  Orientation to time 5  Orientation to Place 5  Registration 3  Attention/ Calculation 2  Recall 1  Language- name 2 objects 2  Language- repeat 0  Language- follow 3 step command 3  Language- read & follow direction 1  Write a sentence 1  Copy design 0  Total score 23  Abnormal test. Will have his PCP review at next visit.      Screening Tests Health Maintenance  Topic Date Due  . TETANUS/TDAP  04/04/1953  . INFLUENZA VACCINE  06/21/2017  . PNA vac Low Risk Adult (2 of 2 - PCV13) 05/17/2018        Plan:  -Pneumovax given today -Check with Social Services about food stamps. Food pantry location handout given.  -Chair exercise handout given and discussed. Encouraged to do them daily. -Advance Directives given and discussed. Bring a signed/notarized copy to our office. -Keep appt with Dr Darlyn Read. Discuss memory concerns and hallucinations. Schedule sooner if needed.  -Patient and daughter requested portable O2. Prescription written and will be sent to [facility] per their request.   I have personally reviewed and noted the following in the patient's chart:   . Medical and social history . Use of alcohol, tobacco or illicit drugs  . Current medications and supplements . Functional ability and status . Nutritional status . Physical activity . Advanced directives . List of other physicians . Hospitalizations, surgeries, and ER visits in previous 12 months . Vitals . Screenings to include cognitive, depression, and falls . Referrals and appointments  In addition, I have reviewed and discussed with patient certain preventive protocols, quality metrics, and best practice recommendations. A written personalized care plan for preventive services as well as general preventive health recommendations were provided to patient.       Demetrios Loll, RN  05/18/2017     I have reviewed and agree with the above AWV documentation.  Mechele Claude, M.D.

## 2017-05-22 ENCOUNTER — Other Ambulatory Visit (HOSPITAL_COMMUNITY): Payer: Self-pay | Admitting: Family Medicine

## 2017-05-23 ENCOUNTER — Other Ambulatory Visit: Payer: Self-pay | Admitting: *Deleted

## 2017-05-23 DIAGNOSIS — J449 Chronic obstructive pulmonary disease, unspecified: Secondary | ICD-10-CM

## 2017-05-26 ENCOUNTER — Other Ambulatory Visit: Payer: Self-pay | Admitting: *Deleted

## 2017-05-26 MED ORDER — TAMSULOSIN HCL 0.4 MG PO CAPS: 0.4000 mg | ORAL_CAPSULE | Freq: Every day | ORAL | 0 refills | Status: DC

## 2017-06-01 ENCOUNTER — Encounter: Payer: Self-pay | Admitting: *Deleted

## 2017-06-01 DIAGNOSIS — J449 Chronic obstructive pulmonary disease, unspecified: Secondary | ICD-10-CM | POA: Diagnosis not present

## 2017-06-17 DIAGNOSIS — J449 Chronic obstructive pulmonary disease, unspecified: Secondary | ICD-10-CM | POA: Diagnosis not present

## 2017-06-18 ENCOUNTER — Other Ambulatory Visit: Payer: Self-pay | Admitting: Family Medicine

## 2017-06-19 ENCOUNTER — Other Ambulatory Visit: Payer: Self-pay | Admitting: Family Medicine

## 2017-06-21 ENCOUNTER — Telehealth: Payer: Self-pay | Admitting: Family Medicine

## 2017-06-21 MED ORDER — LORAZEPAM 1 MG PO TABS: 1.0000 mg | ORAL_TABLET | Freq: Three times a day (TID) | ORAL | 0 refills | Status: DC | PRN

## 2017-06-21 NOTE — Telephone Encounter (Signed)
RX called into [facility] Okayed per Dr Darlyn ReadStacks Pt notified

## 2017-06-21 NOTE — Telephone Encounter (Signed)
Authorize 30 days only.

## 2017-06-27 ENCOUNTER — Other Ambulatory Visit (HOSPITAL_COMMUNITY): Payer: Self-pay | Admitting: Family Medicine

## 2017-06-27 ENCOUNTER — Ambulatory Visit: Payer: Medicare Other | Admitting: Family Medicine

## 2017-07-02 DIAGNOSIS — J449 Chronic obstructive pulmonary disease, unspecified: Secondary | ICD-10-CM | POA: Diagnosis not present

## 2017-07-03 ENCOUNTER — Ambulatory Visit (INDEPENDENT_AMBULATORY_CARE_PROVIDER_SITE_OTHER): Payer: Medicare Other | Admitting: Family Medicine

## 2017-07-03 ENCOUNTER — Encounter: Payer: Self-pay | Admitting: Family Medicine

## 2017-07-03 VITALS — BP 109/59 | HR 91 | Ht 66.0 in | Wt 208.0 lb

## 2017-07-03 DIAGNOSIS — R7303 Prediabetes: Secondary | ICD-10-CM

## 2017-07-03 DIAGNOSIS — I10 Essential (primary) hypertension: Secondary | ICD-10-CM

## 2017-07-03 DIAGNOSIS — J9611 Chronic respiratory failure with hypoxia: Secondary | ICD-10-CM | POA: Diagnosis not present

## 2017-07-03 DIAGNOSIS — J449 Chronic obstructive pulmonary disease, unspecified: Secondary | ICD-10-CM

## 2017-07-03 DIAGNOSIS — E782 Mixed hyperlipidemia: Secondary | ICD-10-CM | POA: Diagnosis not present

## 2017-07-03 DIAGNOSIS — I5042 Chronic combined systolic (congestive) and diastolic (congestive) heart failure: Secondary | ICD-10-CM | POA: Diagnosis not present

## 2017-07-03 DIAGNOSIS — F419 Anxiety disorder, unspecified: Secondary | ICD-10-CM | POA: Diagnosis not present

## 2017-07-03 MED ORDER — LORAZEPAM 1 MG PO TABS: 1.0000 mg | ORAL_TABLET | Freq: Three times a day (TID) | ORAL | 5 refills | Status: DC | PRN

## 2017-07-03 MED ORDER — MEGESTROL ACETATE 400 MG/10ML PO SUSP: 400.0000 mg | Freq: Two times a day (BID) | ORAL | 5 refills | Status: DC

## 2017-07-03 MED ORDER — TRAMADOL HCL 50 MG PO TABS: ORAL_TABLET | ORAL | 5 refills | Status: DC

## 2017-07-03 NOTE — Progress Notes (Signed)
Subjective:  Patient ID: Logan West, male    DOB: 1933/12/26  Age: 83 y.o. MRN: [id number]  CC: Hypertension (pt here today for routine f/u of his chronic medical conditions. )   HPI Logan West presents for  follow-up of hypertension. Patient has no history of headache chest pain or shortness of breath or recent cough. Patient also denies symptoms of TIA such as numbness weakness lateralizing. Patient checks  blood pressure at home and has not had any elevated readings recently. Patient denies side effects from his medication. States taking it regularly. Patient in for follow-up of elevated cholesterol. Doing well without complaints on current medication. Denies side effects of statin including myalgia and arthralgia and nausea. Also in today for liver function testing. Currently no chest pain, shortness of breath or other cardiovascular related symptoms noted.  Patient under treatment long-term with lorazepam for anxiety.Right now to medicine and became severely anxious and had to have a temporary supply called in until he could get to the office.  Follow-up BPH: Doing well without excessive nocturia or frequency or hesitancy. Blood sugar was elevated on recent check but A1c has been normal. We'll check his A1c only if his blood sugar is elevated on routine check today. He denies any symptoms related to diabetes such as polyuria polydipsia nausea and excessive hunger  Depression screen [facility] 2/9 07/03/2017 05/17/2017 12/26/2016  Decreased Interest 0 0 0  Down, Depressed, Hopeless 0 0 0  PHQ - 2 Score 0 0 0  Altered sleeping - - -  Tired, decreased energy - - -  Change in appetite - - -  Feeling bad or failure about yourself  - - -  Trouble concentrating - - -  Moving slowly or fidgety/restless - - -  Suicidal thoughts - - -  PHQ-9 Score - - -  Difficult doing work/chores - - -    History Logan West has a past medical history of Atrial fibrillation (HCC); CHF (congestive heart failure)  (HCC); Chronic pain; COPD (chronic obstructive pulmonary disease) (HCC); Hyperlipidemia; Hypertension; and Obesity.   He has a past surgical history that includes Percutaneous pinning and Fracture surgery.   His family history includes Asthma in his father; COPD in his daughter; Cancer in his mother; Coronary artery disease in his other; Heart attack in his brother; Thyroid disease in his daughter; Tuberculosis in his father.He reports that he quit smoking about 26 years ago. His smoking use included Cigarettes. He has a 62.50 pack-year smoking history. He has never used smokeless tobacco. He reports that he does not drink alcohol or use drugs.    ROS Review of Systems  Constitutional: Negative for chills, diaphoresis, fever and unexpected weight change.  HENT: Negative for congestion, hearing loss, rhinorrhea and sore throat.   Eyes: Negative for visual disturbance.  Respiratory: Positive for shortness of breath (If he removes his oxygen cannula). Negative for cough.   Cardiovascular: Negative for chest pain.  Gastrointestinal: Negative for abdominal pain, constipation and diarrhea.  Genitourinary: Negative for dysuria and flank pain.  Musculoskeletal: Negative for arthralgias and joint swelling.  Skin: Negative for rash.  Neurological: Negative for dizziness and headaches.  Psychiatric/Behavioral: Negative for dysphoric mood and sleep disturbance.    Objective:  BP (!) 109/59   Pulse 91   Ht 5\' 6"  (1.676 m)   Wt 208 lb (94.3 kg)   SpO2 95%   BMI 33.57 kg/m   BP Readings from Last 3 Encounters:  07/03/17 (!) 109/59  05/17/17 124/73  02/09/17 135/82    Wt Readings from Last 3 Encounters:  07/03/17 208 lb (94.3 kg)  05/17/17 206 lb (93.4 kg)  02/09/17 212 lb 9.6 oz (96.4 kg)     Physical Exam  Constitutional: He is oriented to person, place, and time. He appears well-developed and well-nourished. No distress.  HENT:  Head: Normocephalic and atraumatic.  Right Ear:  External ear normal.  Left Ear: External ear normal.  Nose: Nose normal.  Mouth/Throat: Oropharynx is clear and moist.  Eyes: Pupils are equal, round, and reactive to light. Conjunctivae and EOM are normal.  Neck: Normal range of motion. Neck supple. No thyromegaly present.  Cardiovascular: Normal rate, regular rhythm and normal heart sounds.   No murmur heard. Pulmonary/Chest: Effort normal and breath sounds normal. No respiratory distress. He has no wheezes. He has no rales.  Abdominal: Soft. Bowel sounds are normal. He exhibits no distension. There is no tenderness.  Lymphadenopathy:    He has no cervical adenopathy.  Neurological: He is alert and oriented to person, place, and time. He has normal reflexes.  Skin: Skin is warm and dry.  Psychiatric: He has a normal mood and affect. His behavior is normal. Judgment and thought content normal.      Assessment & Plan:   Logan West was seen today for hypertension.  Diagnoses and all orders for this visit:  Essential hypertension  Mixed hyperlipidemia  COPD mixed type (HCC)  Chronic respiratory failure with hypoxia (HCC)  Prediabetes  Chronic combined systolic and diastolic congestive heart failure (HCC)  Anxiety   Conditions above appears stable at this time. Lab tests as noted are pending. Patient to have exertional pulse ox to determine need for continuation of his O2.    I am having Logan West maintain his budesonide-formoterol, diltiazem, furosemide, pravastatin, traMADol, guaiFENesin, nitroGLYCERIN, tamsulosin, metoprolol tartrate, aspirin, LORazepam, and diltiazem.  Allergies as of 07/03/2017   No Known Allergies     Medication List       Accurate as of 07/03/17  2:04 PM. Always use your most recent med list.          aspirin 81 MG EC tablet TAKE 1 TABLET (81 MG TOTAL) BY MOUTH DAILY.   budesonide-formoterol 160-4.5 MCG/ACT inhaler Commonly known as:  SYMBICORT Inhale 2 puffs into the lungs 2 (two) times  daily.   diltiazem 180 MG 24 hr capsule Commonly known as:  CARDIZEM CD Take 1 capsule (180 mg total) by mouth daily. Need appointment for refills.   diltiazem 180 MG 24 hr capsule Commonly known as:  CARDIZEM CD TAKE 1 CAPSULE (180 MG TOTAL) BY MOUTH DAILY.   furosemide 20 MG tablet Commonly known as:  LASIX Take 1 tablet (20 mg total) by mouth every other day.   guaiFENesin 600 MG 12 hr tablet Commonly known as:  MUCINEX Take 2 tablets (1,200 mg total) by mouth 2 (two) times daily.   LORazepam 1 MG tablet Commonly known as:  ATIVAN Take 1 tablet (1 mg total) by mouth every 8 (eight) hours as needed for anxiety.   metoprolol tartrate 50 MG tablet Commonly known as:  LOPRESSOR TAKE 1 TABLET BY MOUTH EVERY DAY   nitroGLYCERIN 0.4 MG SL tablet Commonly known as:  NITROSTAT Place 1 tablet (0.4 mg total) under the tongue every 5 (five) minutes as needed for chest pain.   pravastatin 40 MG tablet Commonly known as:  PRAVACHOL TAKE 1 TABLET (40 MG TOTAL) BY MOUTH DAILY.   tamsulosin 0.4 MG Caps capsule  Commonly known as:  FLOMAX Take 1 capsule (0.4 mg total) by mouth daily.   traMADol 50 MG tablet Commonly known as:  ULTRAM TAKE 1 TABLET EVERY 8 HOURS AS NEEDED FOR MODERATE PAIN        Follow-up: No Follow-up on file.  Mechele Claude, M.D.

## 2017-07-03 NOTE — Addendum Note (Signed)
Addended by: Margurite AuerbachOMPTON, KARLA G on: 07/03/2017 02:41 PM   Modules accepted: Orders

## 2017-07-04 LAB — CMP14+EGFR
A/G RATIO: 1.5 (ref 1.2–2.2)
ALBUMIN: 4 g/dL (ref 3.5–4.7)
ALT: 9 IU/L (ref 0–44)
AST: 15 IU/L (ref 0–40)
Alkaline Phosphatase: 72 IU/L (ref 39–117)
BUN/Creatinine Ratio: 15 (ref 10–24)
BUN: 13 mg/dL (ref 8–27)
Bilirubin Total: 0.6 mg/dL (ref 0.0–1.2)
CALCIUM: 10.1 mg/dL (ref 8.6–10.2)
CO2: 32 mmol/L — ABNORMAL HIGH (ref 20–29)
Chloride: 98 mmol/L (ref 96–106)
Creatinine, Ser: 0.87 mg/dL (ref 0.76–1.27)
GFR, EST AFRICAN AMERICAN: 94 mL/min/{1.73_m2} (ref >59)
GFR, EST NON AFRICAN AMERICAN: 81 mL/min/{1.73_m2} (ref >59)
GLOBULIN, TOTAL: 2.6 g/dL (ref 1.5–4.5)
Glucose: 100 mg/dL — ABNORMAL HIGH (ref 65–99)
POTASSIUM: 4.7 mmol/L (ref 3.5–5.2)
SODIUM: 145 mmol/L — AB (ref 134–144)
TOTAL PROTEIN: 6.6 g/dL (ref 6.0–8.5)

## 2017-07-04 LAB — LIPID PANEL
CHOL/HDL RATIO: 2.9 ratio (ref 0.0–5.0)
Cholesterol, Total: 158 mg/dL (ref 100–199)
HDL: 54 mg/dL (ref >39)
LDL Calculated: 83 mg/dL (ref 0–99)
Triglycerides: 103 mg/dL (ref 0–149)
VLDL Cholesterol Cal: 21 mg/dL (ref 5–40)

## 2017-07-18 DIAGNOSIS — J449 Chronic obstructive pulmonary disease, unspecified: Secondary | ICD-10-CM | POA: Diagnosis not present

## 2017-07-22 ENCOUNTER — Other Ambulatory Visit: Payer: Self-pay | Admitting: Family Medicine

## 2017-07-23 ENCOUNTER — Emergency Department (HOSPITAL_COMMUNITY)
Admission: EM | Admit: 2017-07-23 | Discharge: 2017-07-23 | Disposition: A | Discharge status: Home / Self Care | Payer: Medicare Other | Attending: Emergency Medicine | Admitting: Emergency Medicine

## 2017-07-23 ENCOUNTER — Emergency Department (HOSPITAL_COMMUNITY): Payer: Medicare Other

## 2017-07-23 ENCOUNTER — Encounter (HOSPITAL_COMMUNITY): Payer: Self-pay | Admitting: Cardiology

## 2017-07-23 DIAGNOSIS — Z7982 Long term (current) use of aspirin: Secondary | ICD-10-CM | POA: Diagnosis not present

## 2017-07-23 DIAGNOSIS — I5042 Chronic combined systolic (congestive) and diastolic (congestive) heart failure: Secondary | ICD-10-CM | POA: Diagnosis not present

## 2017-07-23 DIAGNOSIS — Z79899 Other long term (current) drug therapy: Secondary | ICD-10-CM | POA: Diagnosis not present

## 2017-07-23 DIAGNOSIS — I110 Hypertensive heart disease with heart failure: Secondary | ICD-10-CM | POA: Insufficient documentation

## 2017-07-23 DIAGNOSIS — N3000 Acute cystitis without hematuria: Secondary | ICD-10-CM | POA: Diagnosis not present

## 2017-07-23 DIAGNOSIS — M545 Low back pain, unspecified: Secondary | ICD-10-CM

## 2017-07-23 DIAGNOSIS — N3001 Acute cystitis with hematuria: Secondary | ICD-10-CM | POA: Insufficient documentation

## 2017-07-23 DIAGNOSIS — B9689 Other specified bacterial agents as the cause of diseases classified elsewhere: Secondary | ICD-10-CM | POA: Diagnosis not present

## 2017-07-23 DIAGNOSIS — J449 Chronic obstructive pulmonary disease, unspecified: Secondary | ICD-10-CM | POA: Insufficient documentation

## 2017-07-23 DIAGNOSIS — Z87891 Personal history of nicotine dependence: Secondary | ICD-10-CM | POA: Insufficient documentation

## 2017-07-23 LAB — CBC WITH DIFFERENTIAL/PLATELET
BASOS ABS: 0 10*3/uL (ref 0.0–0.1)
Basophils Relative: 0 %
Eosinophils Absolute: 0.2 10*3/uL (ref 0.0–0.7)
Eosinophils Relative: 4 %
HEMATOCRIT: 47.2 % (ref 39.0–52.0)
HEMOGLOBIN: 15.4 g/dL (ref 13.0–17.0)
LYMPHS PCT: 25 %
Lymphs Abs: 1.3 10*3/uL (ref 0.7–4.0)
MCH: 31.6 pg (ref 26.0–34.0)
MCHC: 32.6 g/dL (ref 30.0–36.0)
MCV: 96.9 fL (ref 78.0–100.0)
MONO ABS: 0.4 10*3/uL (ref 0.1–1.0)
Monocytes Relative: 7 %
NEUTROS ABS: 3.5 10*3/uL (ref 1.7–7.7)
NEUTROS PCT: 64 %
Platelets: 189 10*3/uL (ref 150–400)
RBC: 4.87 MIL/uL (ref 4.22–5.81)
RDW: 13 % (ref 11.5–15.5)
WBC: 5.5 10*3/uL (ref 4.0–10.5)

## 2017-07-23 LAB — URINALYSIS, ROUTINE W REFLEX MICROSCOPIC
Bilirubin Urine: NEGATIVE
GLUCOSE, UA: NEGATIVE mg/dL
Hgb urine dipstick: NEGATIVE
Ketones, ur: NEGATIVE mg/dL
NITRITE: NEGATIVE
PH: 6 (ref 5.0–8.0)
PROTEIN: NEGATIVE mg/dL
Specific Gravity, Urine: 1.019 (ref 1.005–1.030)
Squamous Epithelial / LPF: NONE SEEN

## 2017-07-23 LAB — BASIC METABOLIC PANEL
ANION GAP: 5 (ref 5–15)
BUN: 19 mg/dL (ref 6–20)
CALCIUM: 9.9 mg/dL (ref 8.9–10.3)
CHLORIDE: 97 mmol/L — AB (ref 101–111)
CO2: 35 mmol/L — AB (ref 22–32)
Creatinine, Ser: 1.03 mg/dL (ref 0.61–1.24)
GFR calc non Af Amer: >60 mL/min (ref >60)
GLUCOSE: 100 mg/dL — AB (ref 65–99)
Potassium: 4.6 mmol/L (ref 3.5–5.1)
Sodium: 137 mmol/L (ref 135–145)

## 2017-07-23 MED ORDER — HYDROCODONE-ACETAMINOPHEN 5-325 MG PO TABS: 1.0000 | ORAL_TABLET | Freq: Four times a day (QID) | ORAL | 0 refills | Status: DC | PRN

## 2017-07-23 MED ORDER — CEPHALEXIN 500 MG PO CAPS
500.0000 mg | ORAL_CAPSULE | Freq: Once | ORAL | Status: AC
  Administered 2017-07-23: 500 mg via ORAL
  Filled 2017-07-23: qty 1

## 2017-07-23 MED ORDER — CEPHALEXIN 500 MG PO CAPS: 500.0000 mg | ORAL_CAPSULE | Freq: Four times a day (QID) | ORAL | 0 refills | Status: DC

## 2017-07-23 MED ORDER — HYDROCODONE-ACETAMINOPHEN 5-325 MG PO TABS
1.0000 | ORAL_TABLET | Freq: Once | ORAL | Status: AC
  Administered 2017-07-23: 1 via ORAL
  Filled 2017-07-23: qty 1

## 2017-07-23 NOTE — ED Notes (Signed)
Pt family retrieving 02 tank from car for transport home.

## 2017-07-23 NOTE — ED Triage Notes (Signed)
Lower back pain times -5 days.  Pain worse with movement. Pt states it is hard to hold his urine,  But has been this was for a while.  Denies any pain with urination.

## 2017-07-23 NOTE — Discharge Instructions (Signed)
Take the antibiotics as prescribed.  You may take the hydrocodone in place of your tramadol for the next 2 days for hopefully better relief of your pain.  Make sure you are drinking plenty of fluids.

## 2017-07-23 NOTE — ED Provider Notes (Signed)
[facility] DEPT Provider Note   CSN: [id number] Arrival date & time: 07/23/17  7829     History   Chief Complaint Chief Complaint  Patient presents with  . Back Pain    HPI Logan West is a 83 y.o. male with a history of afib, CHF, COPD and HTN presenting with bilateral low back pain which started 3-4 weeks ago, stating he simply sat down when he felt mild midline low back pain which has become more severe over the past week.  He denies dysuria, hematuria but daughter at bedside endorses his urine has been darker than normal.  He has had no fevers, chills, abdominal pain, diarrhea and has had no bowel control issues but has had intermittent episodes of incontinence of urine, stating he will empty his bladder, then have an uncontrolled episode of incontinence about 5 minutes later.  The last episode of this occurred yesterday, no incontinence today.  He has taken tramadol prescribed by his pcp which is no longer controlling his pain. Pain is worsened with movement and better at rest. He denies saddle anesthesia.    The history is provided by the patient.    Past Medical History:  Diagnosis Date  . Atrial fibrillation (HCC)   . CHF (congestive heart failure) (HCC)   . Chronic pain   . COPD (chronic obstructive pulmonary disease) (HCC)   . Hyperlipidemia    x 7-8  . Hypertension    x30 years  . Obesity     Patient Active Problem List   Diagnosis Date Noted  . Prediabetes 04/07/2016  . DNR (do not resuscitate) discussion   . Palliative care encounter   . Hypokalemia 11/30/2015  . Respiratory failure (HCC) 11/28/2015  . COPD mixed type (HCC) 09/21/2015  . Hematuria 09/01/2015  . Chronic atrial fibrillation (HCC) 08/29/2015  . Chronic combined systolic and diastolic congestive heart failure (HCC) 08/01/2015  . Chronic pain syndrome 05/28/2013  . HLD (hyperlipidemia) 05/28/2013  . Essential hypertension 03/16/2011  . Anxiety 03/16/2011  . Obesity 03/16/2011    Past  Surgical History:  Procedure Laterality Date  . FRACTURE SURGERY     right forearm  . PERCUTANEOUS PINNING     right forearm fracture       Home Medications    Prior to Admission medications   Medication Sig Start Date End Date Taking? Authorizing Provider  aspirin 81 MG EC tablet TAKE 1 TABLET (81 MG TOTAL) BY MOUTH DAILY. 06/19/17  Yes Mechele Claude, MD  diltiazem (CARDIZEM CD) 180 MG 24 hr capsule Take 1 capsule (180 mg total) by mouth daily. Need appointment for refills. 12/26/16  Yes Mechele Claude, MD  furosemide (LASIX) 20 MG tablet Take 1 tablet (20 mg total) by mouth every other day. 12/26/16  Yes Mechele Claude, MD  guaiFENesin (MUCINEX) 600 MG 12 hr tablet Take 2 tablets (1,200 mg total) by mouth 2 (two) times daily. 12/26/16  Yes Mechele Claude, MD  LORazepam (ATIVAN) 1 MG tablet Take 1 tablet (1 mg total) by mouth every 8 (eight) hours as needed for anxiety. 07/03/17  Yes Mechele Claude, MD  metoprolol tartrate (LOPRESSOR) 50 MG tablet TAKE 1 TABLET BY MOUTH EVERY DAY 06/19/17  Yes Mechele Claude, MD  nitroGLYCERIN (NITROSTAT) 0.4 MG SL tablet Place 1 tablet (0.4 mg total) under the tongue every 5 (five) minutes as needed for chest pain. 02/10/17 07/23/17 Yes Rollene Rotunda, MD  pravastatin (PRAVACHOL) 40 MG tablet TAKE 1 TABLET (40 MG TOTAL) BY MOUTH DAILY. 12/26/16  Yes Mechele ClaudeStacks, Warren, MD  tamsulosin (FLOMAX) 0.4 MG CAPS capsule Take 1 capsule (0.4 mg total) by mouth daily. 05/26/17  Yes Stacks, Broadus JohnWarren, MD  traMADol (ULTRAM) 50 MG tablet TAKE 1 TABLET EVERY 8 HOURS AS NEEDED FOR MODERATE PAIN 07/03/17  Yes Mechele ClaudeStacks, Warren, MD  budesonide-formoterol [facility](SYMBICORT) 160-4.5 MCG/ACT inhaler Inhale 2 puffs into the lungs 2 (two) times daily. Patient not taking: Reported on 07/23/2017 12/26/16   Mechele ClaudeStacks, Warren, MD  cephALEXin (KEFLEX) 500 MG capsule Take 1 capsule (500 mg total) by mouth 4 (four) times daily. 07/23/17   Burgess AmorIdol, Tymeer Vaquera, PA-C  HYDROcodone-acetaminophen (NORCO/VICODIN) 5-325 MG tablet Take 1  tablet by mouth every 6 (six) hours as needed for severe pain. 07/23/17   Burgess AmorIdol, Talynn Lebon, PA-C  megestrol (MEGACE) 400 MG/10ML suspension Take 10 mLs (400 mg total) by mouth 2 (two) times daily. Patient not taking: Reported on 07/23/2017 07/03/17   Mechele ClaudeStacks, Warren, MD    Family History Family History  Problem Relation Age of Onset  . Cancer Mother   . Asthma Father   . Tuberculosis Father   . Heart attack Brother        same mother.  MI in his 5420s  . Coronary artery disease Other   . COPD Daughter   . Thyroid disease Daughter     Social History Social History  Substance Use Topics  . Smoking status: Former Smoker    Packs/day: 2.50    Years: 25.00    Types: Cigarettes    Quit date: 02/23/1991  . Smokeless tobacco: Never Used  . Alcohol use No     Allergies   Patient has no known allergies.   Review of Systems Review of Systems  Constitutional: Negative for fever.  Respiratory: Negative for shortness of breath.   Cardiovascular: Negative for chest pain and leg swelling.  Gastrointestinal: Negative for abdominal distention, abdominal pain and constipation.  Genitourinary: Positive for difficulty urinating. Negative for dysuria, flank pain, frequency and urgency.  Musculoskeletal: Positive for back pain. Negative for gait problem and joint swelling.  Skin: Negative for rash.  Neurological: Negative for weakness and numbness.     Physical Exam Updated Vital Signs BP 132/84   Pulse 87   Temp (!) 97.5 F (36.4 C) (Oral)   Resp 16   Ht 5\' 8"  (1.727 m)   Wt 90.7 kg (200 lb)   SpO2 100% Comment: On 3 L of oxygen   BMI 30.41 kg/m   Physical Exam  Constitutional: He appears well-developed and well-nourished.  HENT:  Head: Normocephalic.  Eyes: Conjunctivae are normal.  Neck: Normal range of motion. Neck supple.  Cardiovascular: Normal rate and intact distal pulses.   Pedal pulses normal.  Pulmonary/Chest: Effort normal.  Abdominal: Soft. Bowel sounds are normal. He  exhibits no distension and no mass. There is no tenderness. There is no guarding.  Musculoskeletal: Normal range of motion. He exhibits no edema.       Lumbar back: He exhibits tenderness. He exhibits no swelling, no edema and no spasm.  Pt can SLR bilaterally. Increased pain with left SLR.  Neurological: He is alert. He has normal strength. He displays no atrophy and no tremor. No sensory deficit. Gait normal.  Reflex Scores:      Patellar reflexes are 2+ on the right side and 2+ on the left side. No strength deficit noted in hip and knee flexor and extensor muscle groups.  Ankle flexion and extension intact.  Skin: Skin is warm and dry.  Psychiatric:  He has a normal mood and affect.  Nursing note and vitals reviewed.    ED Treatments / Results  Labs (all labs ordered are listed, but only abnormal results are displayed) Labs Reviewed  URINALYSIS, ROUTINE W REFLEX MICROSCOPIC - Abnormal; Notable for the following:       Result Value   APPearance CLOUDY (*)    Leukocytes, UA MODERATE (*)    Bacteria, UA MANY (*)    All other components within normal limits  BASIC METABOLIC PANEL - Abnormal; Notable for the following:    Chloride 97 (*)    CO2 35 (*)    Glucose, Bld 100 (*)    All other components within normal limits  URINE CULTURE  CBC WITH DIFFERENTIAL/PLATELET    EKG  EKG Interpretation None       Radiology Dg Lumbar Spine Complete  Result Date: 07/23/2017 CLINICAL DATA:  Low back pain for 5 days.  No reported injury. EXAM: LUMBAR SPINE - COMPLETE 4+ VIEW COMPARISON:  08/30/2015 CT abdomen/pelvis FINDINGS: This report assumes 5 non rib-bearing lumbar vertebrae. Lumbar vertebral body heights are preserved, with no fracture. Mild multilevel degenerative disc disease throughout the visualized thoracolumbar spine, most prominent at L3-4, not appreciably changed. Minimal 2 mm retrolisthesis at L4-5, stable. Mild facet arthropathy bilaterally in the lower lumbar spine. No  aggressive appearing focal osseous lesions. Abdominal aortic atherosclerosis. IMPRESSION: 1. No lumbar spine fracture. 2. Mild multilevel degenerative disc disease, most prominent at L3-4, not appreciably changed . 3. Stable minimal 2 mm retrolisthesis at L4-5. 4.  Aortic Atherosclerosis (ICD10-I70.0). Electronically Signed   By: Delbert Phenix M.D.   On: 07/23/2017 11:21    Procedures Procedures (including critical care time)  Medications Ordered in ED Medications  cephALEXin (KEFLEX) capsule 500 mg (not administered)  HYDROcodone-acetaminophen (NORCO/VICODIN) 5-325 MG per tablet 1 tablet (not administered)     Initial Impression / Assessment and Plan / ED Course  I have reviewed the triage vital signs and the nursing notes.  Pertinent labs & imaging results that were available during my care of the patient were reviewed by me and considered in my medical decision making (see chart for details).     CT imaging from 08/30/15 revealing extensive atherosclerosis throughout the abdominal and pelvic vasculature, without evidence of aneurysm.  Labs and imaging reviewed.  Urine cx sent.  Pt prescribed keflex, hydrocodone. Advised increased fluid intake, strict return precautions discussed.  Pt was seen by Dr. Lynelle Doctor prior to dc home.  Final Clinical Impressions(s) / ED Diagnoses   Final diagnoses:  Acute bilateral low back pain without sciatica  Acute cystitis with hematuria    New Prescriptions New Prescriptions   CEPHALEXIN (KEFLEX) 500 MG CAPSULE    Take 1 capsule (500 mg total) by mouth 4 (four) times daily.   HYDROCODONE-ACETAMINOPHEN (NORCO/VICODIN) 5-325 MG TABLET    Take 1 tablet by mouth every 6 (six) hours as needed for severe pain.     Burgess Amor, PA-C 07/23/17 1137    Burgess Amor, PA-C 07/23/17 1209    Linwood Dibbles, MD 07/23/17 [phone]

## 2017-07-23 NOTE — ED Notes (Signed)
Family brought portable oxygen tank. Transitioned to home tank at discharge.

## 2017-07-25 ENCOUNTER — Other Ambulatory Visit (HOSPITAL_COMMUNITY): Payer: Self-pay | Admitting: Family Medicine

## 2017-07-26 LAB — URINE CULTURE: Special Requests: NORMAL

## 2017-07-27 ENCOUNTER — Telehealth: Payer: Self-pay | Admitting: *Deleted

## 2017-07-27 NOTE — Telephone Encounter (Signed)
Post ED Visit - Positive Culture Follow-up  Culture report reviewed by antimicrobial stewardship pharmacist:  []  Enzo BiNathan Batchelder, Pharm.D. []  Celedonio MiyamotoJeremy Frens, Pharm.D., BCPS AQ-ID []  Garvin FilaMike Maccia, Pharm.D., BCPS []  Georgina PillionElizabeth Martin, Pharm.D., BCPS []  RavennaMinh Pham, [address] BoulevardPharm.D., BCPS, AAHIVP []  Estella HuskMichelle Turner, Pharm.D., BCPS, AAHIVP []  Lysle Pearlachel Rumbarger, PharmD, BCPS []  Casilda Carlsaylor Stone, PharmD, BCPS [x]  Pollyann SamplesAndy Johnston, PharmD, BCPS  Positive urine culture Treated with Cephalexin, organism sensitive to the same and no further patient follow-up is required at this time.  Virl AxeRobertson, Francine Hannan Talley 07/27/2017, 1:02 PM

## 2017-07-31 ENCOUNTER — Other Ambulatory Visit: Payer: Self-pay | Admitting: Family Medicine

## 2017-08-02 DIAGNOSIS — J449 Chronic obstructive pulmonary disease, unspecified: Secondary | ICD-10-CM | POA: Diagnosis not present

## 2017-08-08 NOTE — Progress Notes (Signed)
Cardiology Office Note   Date:  08/10/2017   ID:  Logan West, DOB 08/28/1934, MRN [id number]  PCP:  Mechele Claude, MD  Cardiologist:   Rollene Rotunda, MD   Chief Complaint  Patient presents with  . Atrial Fibrillation     History of Present Illness: Logan West is a 83 y.o. male who presents for follow up of atrial fibrillation.  Since I last saw him he has done OK.  He rarely has chest pain.  He thinks this happened one time since I last saw him.  He wears O2 continuously.  He does not notice his atrial fib.  The patient denies any new symptoms such as chest discomfort, neck or arm discomfort. There has been no new PND or orthopnea. There have been no reported palpitations, presyncope or syncope.   Past Medical History:  Diagnosis Date  . Atrial fibrillation (HCC)   . CHF (congestive heart failure) (HCC)   . Chronic pain   . COPD (chronic obstructive pulmonary disease) (HCC)   . Hyperlipidemia    x 7-8  . Hypertension    x30 years  . Obesity     Past Surgical History:  Procedure Laterality Date  . FRACTURE SURGERY     right forearm  . PERCUTANEOUS PINNING     right forearm fracture     Current Outpatient Prescriptions  Medication Sig Dispense Refill  . diltiazem (CARDIZEM CD) 180 MG 24 hr capsule Take 1 capsule (180 mg total) by mouth daily. 90 capsule 3  . furosemide (LASIX) 20 MG tablet Take 1 tablet (20 mg total) by mouth every other day. 45 tablet 3  . guaiFENesin (MUCINEX) 600 MG 12 hr tablet Take 2 tablets (1,200 mg total) by mouth 2 (two) times daily. 20 tablet 1  . HYDROcodone-acetaminophen (NORCO/VICODIN) 5-325 MG tablet Take 1 tablet by mouth every 6 (six) hours as needed for severe pain. 12 tablet 0  . LORazepam (ATIVAN) 1 MG tablet Take 1 tablet (1 mg total) by mouth every 8 (eight) hours as needed for anxiety. 60 tablet 5  . pravastatin (PRAVACHOL) 40 MG tablet TAKE 1 TABLET (40 MG TOTAL) BY MOUTH DAILY. 90 tablet 3  . tamsulosin (FLOMAX) 0.4 MG  CAPS capsule Take 1 capsule (0.4 mg total) by mouth daily. 90 capsule 0  . traMADol (ULTRAM) 50 MG tablet TAKE 1 TABLET EVERY 8 HOURS AS NEEDED FOR MODERATE PAIN 60 tablet 5  . metoprolol succinate (TOPROL-XL) 50 MG 24 hr tablet Take 1 tablet (50 mg total) by mouth daily. Take with or immediately following a meal. 90 tablet 3  . nitroGLYCERIN (NITROSTAT) 0.4 MG SL tablet Place 1 tablet (0.4 mg total) under the tongue every 5 (five) minutes as needed for chest pain. 25 tablet 3  . warfarin (COUMADIN) 5 MG tablet Take 1 tablet (5 mg total) by mouth daily. 30 tablet 1   No current facility-administered medications for this visit.     Allergies:   Patient has no known allergies.    ROS:  Please see the history of present illness.   Otherwise, review of systems are positive for none.   All other systems are reviewed and negative.    PHYSICAL EXAM: VS:  BP 101/68   Pulse 86   Ht  (1.727 m)   Wt 206 lb (93.4 kg)   BMI 31.32 kg/m  , BMI Body mass index is 31.32 kg/m.  GENERAL:  Well appearing NECK:  No jugular venous distention, waveform  within normal limits, carotid upstroke brisk and symmetric, no bruits, no thyromegaly LUNGS:  Clear to auscultation bilaterally CHEST:  Unremarkable HEART:  PMI not displaced or sustained,S1 and S2 within normal limits, no S3, no clicks, no rubs, no murmurs, irregular ABD:  Flat, positive bowel sounds normal in frequency in pitch, no bruits, no rebound, no guarding, no midline pulsatile mass, no hepatomegaly, no splenomegaly EXT:  2 plus pulses throughout, no edema, no cyanosis no clubbing   EKG:  EKG is not ordered today.    Recent Labs: 07/03/2017: ALT 9 07/23/2017: BUN 19; Creatinine, Ser 1.03; Hemoglobin 15.4; Platelets 189; Potassium 4.6; Sodium 137    Lipid Panel    Component Value Date/Time   CHOL 158 07/03/2017 1446   CHOL 142 05/28/2013 1050   TRIG 103 07/03/2017 1446   TRIG 93 05/28/2013 1050   HDL 54 07/03/2017 1446   HDL 38 (L)  05/28/2013 1050   CHOLHDL 2.9 07/03/2017 1446   LDLCALC 83 07/03/2017 1446   LDLCALC 85 05/28/2013 1050      Wt Readings from Last 3 Encounters:  08/09/17 206 lb (93.4 kg)  07/23/17 200 lb (90.7 kg)  07/03/17 208 lb (94.3 kg)      Other studies Reviewed: Additional studies/ records that were reviewed today include: None Review of the above records demonstrates:      ASSESSMENT AND PLAN:   ATRIAL FIB:     [name] has a CHA2DS2 - VASc score of 4 with a risk of stroke of 4%. He agrees today to start warfarin.  (He could not afford Eliquis.)  He has no contraindication to this.  He will continue the meds as listed. He will follow in the Warfarin Clinic.    CM:  He has had a slightly reduced EF.  He seems to be euvolemic.  No change in therapy.    CHEST PAIN:   He was not interested in further testing and he says the pain is rare.  No change in therapy.  He will stop his ASA when he starts his warfarin.    Current medicines are reviewed at length with the patient today.  The patient does not have concerns regarding medicines.  The following changes have been made:  As above  Labs/ tests ordered today include:   None    Disposition:   FU with me in 6 months.    Signed, Rollene Rotunda, MD  08/10/2017 12:35 PM    [facility]

## 2017-08-09 ENCOUNTER — Encounter: Payer: Self-pay | Admitting: Cardiology

## 2017-08-09 ENCOUNTER — Ambulatory Visit (INDEPENDENT_AMBULATORY_CARE_PROVIDER_SITE_OTHER): Payer: Medicare Other | Admitting: Cardiology

## 2017-08-09 VITALS — BP 101/68 | HR 86 | Ht 68.0 in | Wt 206.0 lb

## 2017-08-09 DIAGNOSIS — I10 Essential (primary) hypertension: Secondary | ICD-10-CM | POA: Diagnosis not present

## 2017-08-09 DIAGNOSIS — I482 Chronic atrial fibrillation, unspecified: Secondary | ICD-10-CM

## 2017-08-09 MED ORDER — PRAVASTATIN SODIUM 40 MG PO TABS: ORAL_TABLET | ORAL | 3 refills | Status: AC

## 2017-08-09 MED ORDER — WARFARIN SODIUM 5 MG PO TABS: 5.0000 mg | ORAL_TABLET | Freq: Every day | ORAL | 1 refills | Status: DC

## 2017-08-09 MED ORDER — DILTIAZEM HCL ER COATED BEADS 180 MG PO CP24: 180.0000 mg | ORAL_CAPSULE | Freq: Every day | ORAL | 3 refills | Status: DC

## 2017-08-09 MED ORDER — NITROGLYCERIN 0.4 MG SL SUBL: 0.4000 mg | SUBLINGUAL_TABLET | SUBLINGUAL | 3 refills | Status: AC | PRN

## 2017-08-09 MED ORDER — METOPROLOL SUCCINATE ER 50 MG PO TB24: 50.0000 mg | ORAL_TABLET | Freq: Every day | ORAL | 3 refills | Status: DC

## 2017-08-09 MED ORDER — FUROSEMIDE 20 MG PO TABS: 20.0000 mg | ORAL_TABLET | ORAL | 3 refills | Status: DC

## 2017-08-09 NOTE — Patient Instructions (Signed)
Medication Instructions:  Please discontinue ASA and start Coumadin 5 mg once a day. Continue all other medications as listed.  Follow-Up: Follow up in 6 months with Dr. Antoine Poche.  You will receive a letter in the mail 2 months before you are due.  Please call us when you receive this letter to schedule your follow up appointment.  If you need a refill on your cardiac medications before your next appointment, please call your pharmacy.  Thank you for choosing [facility]!!

## 2017-08-10 ENCOUNTER — Encounter: Payer: Self-pay | Admitting: Cardiology

## 2017-08-15 ENCOUNTER — Other Ambulatory Visit: Payer: Self-pay | Admitting: Family Medicine

## 2017-08-17 ENCOUNTER — Encounter: Payer: Medicare Other | Admitting: *Deleted

## 2017-08-18 DIAGNOSIS — J449 Chronic obstructive pulmonary disease, unspecified: Secondary | ICD-10-CM | POA: Diagnosis not present

## 2017-08-22 ENCOUNTER — Telehealth: Payer: Self-pay | Admitting: *Deleted

## 2017-08-22 ENCOUNTER — Telehealth: Payer: Self-pay

## 2017-08-22 NOTE — Telephone Encounter (Signed)
Spoke with patient to f/u as to why he had had started Coumadin as ordered by Dr Antoine Poche.  Pt reports he "just can't do it right now".  States he doesn't have transportation to go to get the medications or go to the doctor's office to have blood work.  He states once his car gets fixed he will start it.  Advised it would be best to start ASAP to provide protection from possible CVA.  Pt states understanding and was grateful for the call.

## 2017-08-22 NOTE — Telephone Encounter (Signed)
Pt was to have started Coumadin and followed up in the Coumadin Clinic at [facility].  Will call him to f/u to see if he did start as ordered.  ATRIAL FIB:     [name] has a CHA2DS2 - VASc score of 4 with a risk of stroke of 4%. He agrees today to start warfarin.  (He could not afford Eliquis.)  He has no contraindication to this.  He will continue the meds as listed. He will follow in the Warfarin Clinic.

## 2017-08-22 NOTE — Telephone Encounter (Signed)
Advised RX was changed 9/19 by Dr Rollene Rotunda at the time of his appointment.Marland Kitchen

## 2017-08-22 NOTE — Telephone Encounter (Signed)
[facility] pharmacy called wanting to know when patient was switched to Toprol XL from Lopressor

## 2017-08-25 ENCOUNTER — Other Ambulatory Visit: Payer: Self-pay

## 2017-08-25 ENCOUNTER — Other Ambulatory Visit (HOSPITAL_COMMUNITY): Payer: Self-pay | Admitting: Family Medicine

## 2017-08-25 MED ORDER — ASPIRIN 81 MG PO TBEC: DELAYED_RELEASE_TABLET | ORAL | 1 refills | Status: DC

## 2017-08-25 NOTE — Telephone Encounter (Signed)
Patient has both coumadin and ASA on his med list. According to telephone note from cardiologist he has not started the coumadin. Is it ok to refill asa. He is to start coumadin as soon as he can. Should he be taking together? Please advise and route to Pool B

## 2017-09-01 DIAGNOSIS — J449 Chronic obstructive pulmonary disease, unspecified: Secondary | ICD-10-CM | POA: Diagnosis not present

## 2017-09-13 ENCOUNTER — Ambulatory Visit (INDEPENDENT_AMBULATORY_CARE_PROVIDER_SITE_OTHER): Payer: Medicare Other

## 2017-09-13 DIAGNOSIS — Z23 Encounter for immunization: Secondary | ICD-10-CM

## 2017-09-17 DIAGNOSIS — J449 Chronic obstructive pulmonary disease, unspecified: Secondary | ICD-10-CM | POA: Diagnosis not present

## 2017-10-02 DIAGNOSIS — J449 Chronic obstructive pulmonary disease, unspecified: Secondary | ICD-10-CM | POA: Diagnosis not present

## 2017-10-18 DIAGNOSIS — J449 Chronic obstructive pulmonary disease, unspecified: Secondary | ICD-10-CM | POA: Diagnosis not present

## 2017-11-01 DIAGNOSIS — J449 Chronic obstructive pulmonary disease, unspecified: Secondary | ICD-10-CM | POA: Diagnosis not present

## 2017-11-17 DIAGNOSIS — J449 Chronic obstructive pulmonary disease, unspecified: Secondary | ICD-10-CM | POA: Diagnosis not present

## 2017-12-02 DIAGNOSIS — J449 Chronic obstructive pulmonary disease, unspecified: Secondary | ICD-10-CM | POA: Diagnosis not present

## 2017-12-12 ENCOUNTER — Other Ambulatory Visit: Payer: Self-pay | Admitting: *Deleted

## 2017-12-12 DIAGNOSIS — I10 Essential (primary) hypertension: Secondary | ICD-10-CM

## 2017-12-12 MED ORDER — FUROSEMIDE 20 MG PO TABS: 20.0000 mg | ORAL_TABLET | ORAL | 0 refills | Status: DC

## 2017-12-16 ENCOUNTER — Other Ambulatory Visit: Payer: Self-pay | Admitting: Family Medicine

## 2017-12-18 DIAGNOSIS — J449 Chronic obstructive pulmonary disease, unspecified: Secondary | ICD-10-CM | POA: Diagnosis not present

## 2017-12-23 ENCOUNTER — Other Ambulatory Visit: Payer: Self-pay

## 2017-12-23 ENCOUNTER — Inpatient Hospital Stay (HOSPITAL_COMMUNITY)
Admission: EM | Admit: 2017-12-23 | Discharge: 2017-12-27 | DRG: 190 | Disposition: A | Discharge status: Home Health | Payer: Medicare Other | Attending: Internal Medicine | Admitting: Internal Medicine

## 2017-12-23 ENCOUNTER — Emergency Department (HOSPITAL_COMMUNITY): Payer: Medicare Other

## 2017-12-23 ENCOUNTER — Encounter (HOSPITAL_COMMUNITY): Payer: Self-pay

## 2017-12-23 DIAGNOSIS — J440 Chronic obstructive pulmonary disease with acute lower respiratory infection: Secondary | ICD-10-CM | POA: Diagnosis not present

## 2017-12-23 DIAGNOSIS — I509 Heart failure, unspecified: Secondary | ICD-10-CM | POA: Diagnosis not present

## 2017-12-23 DIAGNOSIS — R918 Other nonspecific abnormal finding of lung field: Secondary | ICD-10-CM | POA: Diagnosis not present

## 2017-12-23 DIAGNOSIS — Z7901 Long term (current) use of anticoagulants: Secondary | ICD-10-CM | POA: Diagnosis not present

## 2017-12-23 DIAGNOSIS — R739 Hyperglycemia, unspecified: Secondary | ICD-10-CM | POA: Diagnosis not present

## 2017-12-23 DIAGNOSIS — I110 Hypertensive heart disease with heart failure: Secondary | ICD-10-CM | POA: Diagnosis present

## 2017-12-23 DIAGNOSIS — Z87891 Personal history of nicotine dependence: Secondary | ICD-10-CM | POA: Diagnosis not present

## 2017-12-23 DIAGNOSIS — E785 Hyperlipidemia, unspecified: Secondary | ICD-10-CM | POA: Diagnosis not present

## 2017-12-23 DIAGNOSIS — G894 Chronic pain syndrome: Secondary | ICD-10-CM | POA: Diagnosis not present

## 2017-12-23 DIAGNOSIS — Z79899 Other long term (current) drug therapy: Secondary | ICD-10-CM

## 2017-12-23 DIAGNOSIS — I5042 Chronic combined systolic (congestive) and diastolic (congestive) heart failure: Secondary | ICD-10-CM | POA: Diagnosis not present

## 2017-12-23 DIAGNOSIS — D649 Anemia, unspecified: Secondary | ICD-10-CM | POA: Diagnosis present

## 2017-12-23 DIAGNOSIS — J449 Chronic obstructive pulmonary disease, unspecified: Secondary | ICD-10-CM | POA: Diagnosis not present

## 2017-12-23 DIAGNOSIS — R7303 Prediabetes: Secondary | ICD-10-CM | POA: Diagnosis not present

## 2017-12-23 DIAGNOSIS — I252 Old myocardial infarction: Secondary | ICD-10-CM

## 2017-12-23 DIAGNOSIS — J9622 Acute and chronic respiratory failure with hypercapnia: Secondary | ICD-10-CM | POA: Diagnosis present

## 2017-12-23 DIAGNOSIS — R4182 Altered mental status, unspecified: Secondary | ICD-10-CM

## 2017-12-23 DIAGNOSIS — I482 Chronic atrial fibrillation, unspecified: Secondary | ICD-10-CM | POA: Diagnosis present

## 2017-12-23 DIAGNOSIS — J441 Chronic obstructive pulmonary disease with (acute) exacerbation: Secondary | ICD-10-CM | POA: Diagnosis not present

## 2017-12-23 DIAGNOSIS — E872 Acidosis: Secondary | ICD-10-CM | POA: Diagnosis not present

## 2017-12-23 DIAGNOSIS — Z9981 Dependence on supplemental oxygen: Secondary | ICD-10-CM

## 2017-12-23 DIAGNOSIS — Z66 Do not resuscitate: Secondary | ICD-10-CM | POA: Diagnosis present

## 2017-12-23 DIAGNOSIS — Z7722 Contact with and (suspected) exposure to environmental tobacco smoke (acute) (chronic): Secondary | ICD-10-CM | POA: Diagnosis not present

## 2017-12-23 DIAGNOSIS — J9602 Acute respiratory failure with hypercapnia: Secondary | ICD-10-CM | POA: Diagnosis not present

## 2017-12-23 DIAGNOSIS — G9341 Metabolic encephalopathy: Secondary | ICD-10-CM | POA: Diagnosis not present

## 2017-12-23 DIAGNOSIS — R0682 Tachypnea, not elsewhere classified: Secondary | ICD-10-CM | POA: Diagnosis not present

## 2017-12-23 DIAGNOSIS — Z8249 Family history of ischemic heart disease and other diseases of the circulatory system: Secondary | ICD-10-CM | POA: Diagnosis not present

## 2017-12-23 DIAGNOSIS — R531 Weakness: Secondary | ICD-10-CM | POA: Diagnosis not present

## 2017-12-23 DIAGNOSIS — J9612 Chronic respiratory failure with hypercapnia: Secondary | ICD-10-CM

## 2017-12-23 DIAGNOSIS — T50905A Adverse effect of unspecified drugs, medicaments and biological substances, initial encounter: Secondary | ICD-10-CM | POA: Diagnosis not present

## 2017-12-23 DIAGNOSIS — Z7982 Long term (current) use of aspirin: Secondary | ICD-10-CM

## 2017-12-23 DIAGNOSIS — J9600 Acute respiratory failure, unspecified whether with hypoxia or hypercapnia: Secondary | ICD-10-CM | POA: Diagnosis not present

## 2017-12-23 LAB — COMPREHENSIVE METABOLIC PANEL
ALBUMIN: 2.7 g/dL — AB (ref 3.5–5.0)
ALT: 18 U/L (ref 17–63)
ANION GAP: 9 (ref 5–15)
AST: 15 U/L (ref 15–41)
Alkaline Phosphatase: 80 U/L (ref 38–126)
BUN: 16 mg/dL (ref 6–20)
CHLORIDE: 95 mmol/L — AB (ref 101–111)
CO2: 38 mmol/L — AB (ref 22–32)
Calcium: 9.2 mg/dL (ref 8.9–10.3)
Creatinine, Ser: 0.68 mg/dL (ref 0.61–1.24)
GFR calc non Af Amer: >60 mL/min (ref >60)
GLUCOSE: 115 mg/dL — AB (ref 65–99)
Potassium: 5.1 mmol/L (ref 3.5–5.1)
SODIUM: 142 mmol/L (ref 135–145)
Total Bilirubin: 0.7 mg/dL (ref 0.3–1.2)
Total Protein: 7.1 g/dL (ref 6.5–8.1)

## 2017-12-23 LAB — GLUCOSE, CAPILLARY
GLUCOSE-CAPILLARY: 119 mg/dL — AB (ref 65–99)
Glucose-Capillary: 149 mg/dL — ABNORMAL HIGH (ref 65–99)

## 2017-12-23 LAB — BLOOD GAS, ARTERIAL
Acid-Base Excess: 10.4 mmol/L — ABNORMAL HIGH (ref 0.0–2.0)
BICARBONATE: 31.2 mmol/L — AB (ref 20.0–28.0)
Drawn by: 221791
O2 Content: 4.5 L/min
O2 Saturation: 95 %
PCO2 ART: 88.2 mmHg — AB (ref 32.0–48.0)
PH ART: 7.253 — AB (ref 7.350–7.450)
pO2, Arterial: 81.7 mmHg — ABNORMAL LOW (ref 83.0–108.0)

## 2017-12-23 LAB — CBC WITH DIFFERENTIAL/PLATELET
Basophils Absolute: 0 10*3/uL (ref 0.0–0.1)
Basophils Relative: 0 %
Eosinophils Absolute: 0 10*3/uL (ref 0.0–0.7)
Eosinophils Relative: 0 %
HCT: 40.7 % (ref 39.0–52.0)
HEMOGLOBIN: 11.9 g/dL — AB (ref 13.0–17.0)
LYMPHS ABS: 0.5 10*3/uL — AB (ref 0.7–4.0)
LYMPHS PCT: 8 %
MCH: 30.9 pg (ref 26.0–34.0)
MCHC: 29.2 g/dL — ABNORMAL LOW (ref 30.0–36.0)
MCV: 105.7 fL — AB (ref 78.0–100.0)
Monocytes Absolute: 0.1 10*3/uL (ref 0.1–1.0)
Monocytes Relative: 2 %
NEUTROS ABS: 5.9 10*3/uL (ref 1.7–7.7)
NEUTROS PCT: 90 %
Platelets: 252 10*3/uL (ref 150–400)
RBC: 3.85 MIL/uL — AB (ref 4.22–5.81)
RDW: 12.3 % (ref 11.5–15.5)
WBC: 6.5 10*3/uL (ref 4.0–10.5)

## 2017-12-23 LAB — URINALYSIS, ROUTINE W REFLEX MICROSCOPIC
BILIRUBIN URINE: NEGATIVE
Glucose, UA: NEGATIVE mg/dL
Hgb urine dipstick: NEGATIVE
KETONES UR: NEGATIVE mg/dL
Leukocytes, UA: NEGATIVE
NITRITE: NEGATIVE
PH: 5 (ref 5.0–8.0)
PROTEIN: NEGATIVE mg/dL
Specific Gravity, Urine: 1.024 (ref 1.005–1.030)

## 2017-12-23 LAB — HEMOGLOBIN A1C
Hgb A1c MFr Bld: 4.9 % (ref 4.8–5.6)
Mean Plasma Glucose: 93.93 mg/dL

## 2017-12-23 LAB — PROTIME-INR
INR: 1.08
PROTHROMBIN TIME: 13.9 s (ref 11.4–15.2)

## 2017-12-23 LAB — I-STAT CG4 LACTIC ACID, ED: Lactic Acid, Venous: 1.05 mmol/L (ref 0.5–1.9)

## 2017-12-23 MED ORDER — INSULIN ASPART 100 UNIT/ML ~~LOC~~ SOLN: 0.0000 [IU] | Freq: Every day | SUBCUTANEOUS | Status: DC

## 2017-12-23 MED ORDER — INSULIN ASPART 100 UNIT/ML ~~LOC~~ SOLN
0.0000 [IU] | Freq: Three times a day (TID) | SUBCUTANEOUS | Status: DC
  Administered 2017-12-23 – 2017-12-25 (×5): 3 [IU] via SUBCUTANEOUS

## 2017-12-23 MED ORDER — WARFARIN - PHARMACIST DOSING INPATIENT: Freq: Every day | Status: DC

## 2017-12-23 MED ORDER — SODIUM CHLORIDE 0.9 % IV BOLUS (SEPSIS)
1000.0000 mL | Freq: Once | INTRAVENOUS | Status: AC
  Administered 2017-12-23: 1000 mL via INTRAVENOUS

## 2017-12-23 MED ORDER — SODIUM CHLORIDE 0.9% FLUSH: 3.0000 mL | INTRAVENOUS | Status: DC | PRN

## 2017-12-23 MED ORDER — AZITHROMYCIN 250 MG PO TABS: 500.0000 mg | ORAL_TABLET | Freq: Once | ORAL | Status: DC

## 2017-12-23 MED ORDER — SENNOSIDES-DOCUSATE SODIUM 8.6-50 MG PO TABS: 1.0000 | ORAL_TABLET | Freq: Every evening | ORAL | Status: DC | PRN

## 2017-12-23 MED ORDER — ONDANSETRON HCL 4 MG/2ML IJ SOLN: 4.0000 mg | Freq: Four times a day (QID) | INTRAMUSCULAR | Status: DC | PRN

## 2017-12-23 MED ORDER — TAMSULOSIN HCL 0.4 MG PO CAPS
0.4000 mg | ORAL_CAPSULE | ORAL | Status: DC
  Administered 2017-12-23 – 2017-12-25 (×2): 0.4 mg via ORAL
  Filled 2017-12-23 (×4): qty 1

## 2017-12-23 MED ORDER — NITROGLYCERIN 0.4 MG SL SUBL: 0.4000 mg | SUBLINGUAL_TABLET | SUBLINGUAL | Status: DC | PRN

## 2017-12-23 MED ORDER — METOPROLOL SUCCINATE ER 50 MG PO TB24
50.0000 mg | ORAL_TABLET | Freq: Every day | ORAL | Status: DC
  Administered 2017-12-23 – 2017-12-27 (×5): 50 mg via ORAL
  Filled 2017-12-23 (×5): qty 1

## 2017-12-23 MED ORDER — WARFARIN SODIUM 5 MG PO TABS: 5.0000 mg | ORAL_TABLET | Freq: Once | ORAL | Status: DC

## 2017-12-23 MED ORDER — PANTOPRAZOLE SODIUM 40 MG PO TBEC
40.0000 mg | DELAYED_RELEASE_TABLET | Freq: Every day | ORAL | Status: DC
  Administered 2017-12-24 – 2017-12-27 (×4): 40 mg via ORAL
  Filled 2017-12-23 (×4): qty 1

## 2017-12-23 MED ORDER — LEVOFLOXACIN IN D5W 750 MG/150ML IV SOLN
750.0000 mg | INTRAVENOUS | Status: DC
  Administered 2017-12-24 – 2017-12-26 (×3): 750 mg via INTRAVENOUS
  Filled 2017-12-23 (×3): qty 150

## 2017-12-23 MED ORDER — SODIUM CHLORIDE 0.9 % IV SOLN
250.0000 mL | INTRAVENOUS | Status: DC | PRN
  Administered 2017-12-23: 250 mL via INTRAVENOUS

## 2017-12-23 MED ORDER — METHYLPREDNISOLONE SODIUM SUCC 125 MG IJ SOLR
60.0000 mg | Freq: Four times a day (QID) | INTRAMUSCULAR | Status: DC
  Administered 2017-12-23 – 2017-12-24 (×2): 60 mg via INTRAVENOUS
  Filled 2017-12-23 (×2): qty 2

## 2017-12-23 MED ORDER — INSULIN ASPART 100 UNIT/ML ~~LOC~~ SOLN
6.0000 [IU] | Freq: Three times a day (TID) | SUBCUTANEOUS | Status: DC
  Administered 2017-12-23: 6 [IU] via SUBCUTANEOUS

## 2017-12-23 MED ORDER — FUROSEMIDE 20 MG PO TABS
20.0000 mg | ORAL_TABLET | ORAL | Status: DC
  Administered 2017-12-23 – 2017-12-27 (×3): 20 mg via ORAL
  Filled 2017-12-23 (×4): qty 1

## 2017-12-23 MED ORDER — SODIUM CHLORIDE 0.9% FLUSH
3.0000 mL | Freq: Two times a day (BID) | INTRAVENOUS | Status: DC
  Administered 2017-12-23 – 2017-12-27 (×7): 3 mL via INTRAVENOUS

## 2017-12-23 MED ORDER — LORAZEPAM 2 MG/ML IJ SOLN
0.5000 mg | INTRAMUSCULAR | Status: DC | PRN
  Administered 2017-12-24: 0.5 mg via INTRAVENOUS
  Filled 2017-12-23: qty 1

## 2017-12-23 MED ORDER — ONDANSETRON HCL 4 MG PO TABS: 4.0000 mg | ORAL_TABLET | Freq: Four times a day (QID) | ORAL | Status: DC | PRN

## 2017-12-23 MED ORDER — IPRATROPIUM-ALBUTEROL 0.5-2.5 (3) MG/3ML IN SOLN
3.0000 mL | RESPIRATORY_TRACT | Status: DC
  Administered 2017-12-23 – 2017-12-24 (×4): 3 mL via RESPIRATORY_TRACT
  Filled 2017-12-23 (×6): qty 3

## 2017-12-23 MED ORDER — ACETAMINOPHEN 650 MG RE SUPP: 650.0000 mg | Freq: Four times a day (QID) | RECTAL | Status: DC | PRN

## 2017-12-23 MED ORDER — DEXTROSE 5 % IV SOLN
500.0000 mg | Freq: Once | INTRAVENOUS | Status: AC
  Administered 2017-12-23: 500 mg via INTRAVENOUS
  Filled 2017-12-23: qty 500

## 2017-12-23 MED ORDER — HYDROCODONE-ACETAMINOPHEN 5-325 MG PO TABS
1.0000 | ORAL_TABLET | Freq: Four times a day (QID) | ORAL | Status: DC | PRN
  Administered 2017-12-24 – 2017-12-27 (×2): 1 via ORAL
  Filled 2017-12-23 (×2): qty 1

## 2017-12-23 MED ORDER — PRAVASTATIN SODIUM 40 MG PO TABS
40.0000 mg | ORAL_TABLET | Freq: Every day | ORAL | Status: DC
  Administered 2017-12-23 – 2017-12-25 (×3): 40 mg via ORAL
  Filled 2017-12-23 (×4): qty 1

## 2017-12-23 MED ORDER — BUDESONIDE 0.25 MG/2ML IN SUSP
0.2500 mg | Freq: Two times a day (BID) | RESPIRATORY_TRACT | Status: DC
  Administered 2017-12-23 – 2017-12-26 (×7): 0.25 mg via RESPIRATORY_TRACT
  Filled 2017-12-23 (×12): qty 2

## 2017-12-23 MED ORDER — GUAIFENESIN ER 600 MG PO TB12
1200.0000 mg | ORAL_TABLET | Freq: Two times a day (BID) | ORAL | Status: DC
  Administered 2017-12-23 – 2017-12-27 (×8): 1200 mg via ORAL
  Filled 2017-12-23 (×8): qty 2

## 2017-12-23 MED ORDER — CEFTRIAXONE SODIUM 1 G IJ SOLR
1.0000 g | Freq: Once | INTRAMUSCULAR | Status: AC
  Administered 2017-12-23: 1 g via INTRAVENOUS
  Filled 2017-12-23: qty 10

## 2017-12-23 MED ORDER — HYDROCODONE-ACETAMINOPHEN 5-325 MG PO TABS: 1.0000 | ORAL_TABLET | ORAL | Status: DC | PRN

## 2017-12-23 MED ORDER — DILTIAZEM HCL ER COATED BEADS 180 MG PO CP24
180.0000 mg | ORAL_CAPSULE | Freq: Every day | ORAL | Status: DC
  Administered 2017-12-23 – 2017-12-27 (×5): 180 mg via ORAL
  Filled 2017-12-23 (×6): qty 1

## 2017-12-23 MED ORDER — ACETAMINOPHEN 325 MG PO TABS: 650.0000 mg | ORAL_TABLET | Freq: Four times a day (QID) | ORAL | Status: DC | PRN

## 2017-12-23 NOTE — ED Notes (Signed)
CRITICAL VALUE ALERT  Critical Value:  Ph 7.253, pc02 88.2, po2 81.7, bicarb 31.2, so2 95% on 4.5 liters  Date & Time Notied:  12/23/2017, 1330  Provider Notified: Dr. Lynelle DoctorKnapp  Orders Received/Actions taken: bipap

## 2017-12-23 NOTE — H&P (Addendum)
History and Physical  Logan StaggersHorace Shipman OZH:[id number] DOB: February 26, 1934 DOA: 12/23/2017  Referring physician: Lynelle DoctorKnapp  PCP: Mechele ClaudeStacks, Warren, MD   Chief Complaint: Altered Mental Status  Historian: History taken from ED physician, family members and records, patient unable to participate due to altered mental status  HPI: Logan West is a 84 y.o. male former smoker with known COPD, chronic atrial fibrillation, hypertension, obesity and other past medical history detailed below presented to the emergency department by EMS when family discovered the patient was expressing weakness and confusion.  His daughter found him in the bed diaphoretic and confused.  EMS gave him a gram of Rocephin, 125 mg of Solu-Medrol and albuterol. The patient was seen in ED and found to be confused.  An ABG was obtained and the patient was noted to have a respiratory acidosis with a pH of 7.23, pCO2 88, pO2 81.  He was immediately started on BiPAP therapy.  He has remained fairly lethargic but has been arousable.  The patient daughter says that he would not want to be intubated.  DNR.  The patient is being admitted for an acute exacerbation of COPD with possible pneumonia.  His chest x-ray was suspicious for interstitial findings suggesting infection.  The patient will be admitted to the stepdown unit.  Review of Systems: All systems reviewed and apart from history of presenting illness, are negative.  Past Medical History:  Diagnosis Date  . Atrial fibrillation (HCC)   . CHF (congestive heart failure) (HCC)   . Chronic pain   . COPD (chronic obstructive pulmonary disease) (HCC)   . Hyperlipidemia    x 7-8  . Hypertension    x30 years  . Obesity    Past Surgical History:  Procedure Laterality Date  . FRACTURE SURGERY     right forearm  . PERCUTANEOUS PINNING     right forearm fracture   Social History:  reports that he quit smoking about 26 years ago. His smoking use included cigarettes. He has a 62.50 pack-year  smoking history. he has never used smokeless tobacco. He reports that he does not drink alcohol or use drugs.  No Known Allergies  Family History  Problem Relation Age of Onset  . Cancer Mother   . Asthma Father   . Tuberculosis Father   . Heart attack Brother        same mother.  MI in his 5620s  . Coronary artery disease Other   . COPD Daughter   . Thyroid disease Daughter     Prior to Admission medications   Medication Sig Start Date End Date Taking? Authorizing Provider  aspirin 81 MG EC tablet TAKE 1 TABLET (81 MG TOTAL) BY MOUTH DAILY. 08/25/17   Mechele ClaudeStacks, Warren, MD  diltiazem (CARDIZEM CD) 180 MG 24 hr capsule Take 1 capsule (180 mg total) by mouth daily. 08/09/17   Rollene RotundaHochrein, James, MD  diltiazem (CARDIZEM CD) 180 MG 24 hr capsule TAKE 1 CAPSULE (180 MG TOTAL) BY MOUTH DAILY. 08/25/17   Mechele ClaudeStacks, Warren, MD  furosemide (LASIX) 20 MG tablet Take 1 tablet (20 mg total) by mouth every other day. 12/12/17   Mechele ClaudeStacks, Warren, MD  guaiFENesin (MUCINEX) 600 MG 12 hr tablet Take 2 tablets (1,200 mg total) by mouth 2 (two) times daily. 12/26/16   Mechele ClaudeStacks, Warren, MD  HYDROcodone-acetaminophen (NORCO/VICODIN) 5-325 MG tablet Take 1 tablet by mouth every 6 (six) hours as needed for severe pain. 07/23/17   Burgess AmorIdol, Julie, PA-C  LORazepam (ATIVAN) 1 MG tablet Take  1 tablet (1 mg total) by mouth every 8 (eight) hours as needed for anxiety. 07/03/17   Mechele Claude, MD  metoprolol succinate (TOPROL-XL) 50 MG 24 hr tablet Take 1 tablet (50 mg total) by mouth daily. Take with or immediately following a meal. 08/09/17 11/07/17  Rollene Rotunda, MD  nitroGLYCERIN (NITROSTAT) 0.4 MG SL tablet Place 1 tablet (0.4 mg total) under the tongue every 5 (five) minutes as needed for chest pain. 08/09/17 11/07/17  Rollene Rotunda, MD  pravastatin (PRAVACHOL) 40 MG tablet TAKE 1 TABLET (40 MG TOTAL) BY MOUTH DAILY. 08/09/17   Rollene Rotunda, MD  tamsulosin (FLOMAX) 0.4 MG CAPS capsule Take 1 capsule (0.4 mg total) by mouth daily.  05/26/17   Mechele Claude, MD  traMADol (ULTRAM) 50 MG tablet TAKE 1 TABLET EVERY 8 HOURS AS NEEDED FOR MODERATE PAIN 07/03/17   Mechele Claude, MD  warfarin (COUMADIN) 5 MG tablet Take 1 tablet (5 mg total) by mouth daily. 08/09/17   Rollene Rotunda, MD   Physical Exam: Vitals:   12/23/17 1245 12/23/17 1300 12/23/17 1337 12/23/17 1355  BP:  (!) 158/76 (!) 166/77 (!) 166/77  Pulse: (!) 104 86 88 89  Resp: (!) 23 (!) 24 18 (!) 24  Temp:      TempSrc:      SpO2: 98% 97% 97% 98%  Weight:      Height:         General exam: Patient is arousable but lethargic.  Moderately built and nourished patient, lying comfortably supine on the gurney in no obvious distress.  Patient is on BiPAP. Trying to remove bipap at times.    Head, eyes and ENT: Nontraumatic and normocephalic. Pupils equally reacting to light and accommodation. Oral mucosa moist.  Neck: Supple. No JVD, carotid bruit or thyromegaly.  Lymphatics: No lymphadenopathy.  Respiratory system: diffuse expiratory wheezing, very poor air movement,  increased work of breathing.  Cardiovascular system: S1 and S2 heard. No JVD, murmurs, gallops, clicks or pedal edema.  Gastrointestinal system: Abdomen is nondistended, soft and nontender. Normal bowel sounds heard. No organomegaly or masses appreciated.  Central nervous system: Alert and oriented. No focal neurological deficits.  Extremities: Symmetric 5 x 5 power. Peripheral pulses symmetrically felt.   Skin: No rashes or acute findings.  Musculoskeletal system: Negative exam.  Psychiatry: Pleasant and cooperative.  Labs on Admission:  Basic Metabolic Panel: Recent Labs  Lab 12/23/17 1318  NA 142  K 5.1  CL 95*  CO2 38*  GLUCOSE 115*  BUN 16  CREATININE 0.68  CALCIUM 9.2   Liver Function Tests: Recent Labs  Lab 12/23/17 1318  AST 15  ALT 18  ALKPHOS 80  BILITOT 0.7  PROT 7.1  ALBUMIN 2.7*   No results for input(s): LIPASE, AMYLASE in the last 168 hours. No  results for input(s): AMMONIA in the last 168 hours. CBC: Recent Labs  Lab 12/23/17 1318  WBC 6.5  NEUTROABS 5.9  HGB 11.9*  HCT 40.7  MCV 105.7*  PLT 252   Cardiac Enzymes: No results for input(s): CKTOTAL, CKMB, CKMBINDEX, TROPONINI in the last 168 hours.  BNP (last 3 results) No results for input(s): PROBNP in the last 8760 hours. CBG: No results for input(s): GLUCAP in the last 168 hours.  Radiological Exams on Admission: Dg Chest 2 View  Result Date: 12/23/2017 CLINICAL DATA:  Altered mental status, wheezing, unable to cooperate EXAM: CHEST - 2 VIEW COMPARISON:  01/08/2016 FINDINGS: Course interstitial and patchy airspace opacities in both lung  bases, increased since previous exam. Somewhat attenuated bronchovascular markings in the upper lobes. Heart size upper limits normal. Aortic Atherosclerosis (ICD10-170.0) No definite effusion on these supine radiographs. No pneumothorax evident. Visualized bones unremarkable. IMPRESSION: 1. Slight worsening of bibasilar interstitial and patchy airspace opacities since prior study. Electronically Signed   By: Corlis Leak M.D.   On: 12/23/2017 13:50   EKG: pending  Assessment/Plan Principal Problem:   Acute respiratory failure with hypercapnia (HCC) Active Problems:   Acute respiratory acidosis   Acute exacerbation of chronic obstructive pulmonary disease (COPD) (HCC)   Chronic pain syndrome   Chronic combined systolic and diastolic congestive heart failure (HCC)   Chronic atrial fibrillation (HCC)   COPD mixed type (HCC)   Prediabetes   DNR (do not resuscitate)   Anemia, unspecified   Hyperglycemia  1. Acute respiratory failure with hypercapnia- continue BiPAP treatment.  The patient is very clear that he does not want to be intubated.  I am concerned that he may not tolerate the BiPAP for long periods of time.  Treating COPD exacerbation aggressively.  Continues oxygen support.  Continue IV antibiotics and follow in the stepdown  unit.  Repeat ABG in the morning. Lorazepam ordered for sedation as needed.  2. Acute change in mental status - suspect secondary to acute respiratory acidosis and hypercarbia, he does not have neurological changes to suggest CVA although he does have stroke risk because of Afib and doesn't seem to be anticoagulated (INR 1.09).  He is slowly improving on bipap.  3. Acute exacerbation of COPD-unfortunately patient has a lot of secondhand smoke exposure in the house although the patient stopped smoking 20 years ago.  He did smoke for 20+ years.  He is being admitted for IV steroids, nebulizer treatments, IV antibiotics, BiPAP therapy. 4. Atypical community acquired pneumonia - IV levofloxacin ordered.  5. Acute respiratory acidosis- this likely is the immediate cause of his altered mental status and hopefully with improvement in the PCO2 this will resolve and he will return to his baseline.  His daughter reports that she is noticing some improvement on BiPAP. 6. Chronic atrial fibrillation-per cardiology records, patient was supposed to be anticoagulated with warfarin because of CHADVASC score of 4 and 4% risk of stroke the patient's daughter tells me that the patient decided not to take warfarin and has never taken it.  He understands the risk of stroke and want to follow up with cardiologist to discuss further.   7. Prediabetes-check hemoglobin A1c, suspect hyperglycemia associated with IV steroid use, sliding scale insulin coverage ordered in addition to prandial insulin coverage.  Follow CBG closely. 8. DNR-the daughter conveyed to me that the patient has long been a DO NOT RESUSCITATE/DO NOT INTUBATE.  He does not seem to be happy with the BiPAP.  I explained to him that the BiPAP is temporary and it does not involve a tube down the throat.  I hope that he will tolerate and allow [place] to continue the BiPAP therapy until he has improved.  He will be monitored in the stepdown unit. 9. Chronic combined  systolic and diastolic congestive heart failure-with this acute change in respiratory status I would like to check an echocardiogram to be sure that CHF is not contributing to this acute decline.  Resume all of his home medications.  DVT Prophylaxis: Warfarin Code Status: DNR Family Communication: Daughter at bedside Disposition Plan: To be determined  Critical Care Time spent: 65 minutes  Update: I went back to check on  the patient and found that his mental status is improving and he is becoming more verbal and able to answer questions. 4:24 PM   Standley Dakins, MD [facility] Pager [phone]  If 7PM-7AM, please contact night-coverage [email or web address] Password [facility] 12/23/2017, 3:15 PM

## 2017-12-23 NOTE — ED Triage Notes (Signed)
EMS reports pt lives with daughter.  They arrived to find pt laying in bed diaphoretic and confused.  Daughter told ems pt had weakness and some confusion yesterday.  Daughter says pt may have taken someone else's medications but isn't sure.  EMS reports pt was very hot to touch and wheezing.  Left side lungs sounds diminished.  Pt is supposed to use albuterol treatments but he is not compliant.  EMS gave 1 gram rocephin, nss, 125 solumedrol, and 2.5mg  albuterol.  Pt now  No longer diaphoretic and is cold to touch.  Pt more alert after the albuterol and is able to answer questions more appropriately.  CBG 119.

## 2017-12-23 NOTE — ED Notes (Signed)
Patient transported to X-ray 

## 2017-12-23 NOTE — ED Provider Notes (Addendum)
[facility] EMERGENCY DEPARTMENT Provider Note   CSN: [id number] Arrival date & time: 12/23/17  1221     History   Chief Complaint Chief Complaint  Patient presents with  . Altered Mental Status    HPI Logan West is a 84 y.o. male.  HPI Patient presents to the emergency room for evaluation of altered mental status.  According to the EMS report patient was found in his bed by his daughter somewhat diaphoretic and confused.  The daughter told EMS the patient had weakness and confusion yesterday.  When EMS arrived they found that the patient was warm to the touch and wheezing.  They gave him a gram of Rocephin, 500 cc of normal saline, 125 of Solu-Medrol, and albuterol treatment and transferred him to the emergency room.  Patient was sleeping when I walked in the room but he woke up and answer my questions.  He states he has been feeling weak since yesterday.  He denies any headache.  He denies any focal numbness or weakness.  He has been coughing.  He denies any chest pain or abdominal pain.  No fevers. Past Medical History:  Diagnosis Date  . Atrial fibrillation (HCC)   . CHF (congestive heart failure) (HCC)   . Chronic pain   . COPD (chronic obstructive pulmonary disease) (HCC)   . Hyperlipidemia    x 7-8  . Hypertension    x30 years  . Obesity     Patient Active Problem List   Diagnosis Date Noted  . Prediabetes 04/07/2016  . DNR (do not resuscitate) discussion   . Palliative care encounter   . Hypokalemia 11/30/2015  . Respiratory failure (HCC) 11/28/2015  . COPD mixed type (HCC) 09/21/2015  . Hematuria 09/01/2015  . Chronic atrial fibrillation (HCC) 08/29/2015  . Chronic combined systolic and diastolic congestive heart failure (HCC) 08/01/2015  . Chronic pain syndrome 05/28/2013  . HLD (hyperlipidemia) 05/28/2013  . Essential hypertension 03/16/2011  . Anxiety 03/16/2011  . Obesity 03/16/2011    Past Surgical History:  Procedure Laterality Date  . FRACTURE  SURGERY     right forearm  . PERCUTANEOUS PINNING     right forearm fracture       Home Medications    Prior to Admission medications   Medication Sig Start Date End Date Taking? Authorizing Provider  aspirin 81 MG EC tablet TAKE 1 TABLET (81 MG TOTAL) BY MOUTH DAILY. 08/25/17   Mechele Claude, MD  diltiazem (CARDIZEM CD) 180 MG 24 hr capsule Take 1 capsule (180 mg total) by mouth daily. 08/09/17   Rollene Rotunda, MD  diltiazem (CARDIZEM CD) 180 MG 24 hr capsule TAKE 1 CAPSULE (180 MG TOTAL) BY MOUTH DAILY. 08/25/17   Mechele Claude, MD  furosemide (LASIX) 20 MG tablet Take 1 tablet (20 mg total) by mouth every other day. 12/12/17   Mechele Claude, MD  guaiFENesin (MUCINEX) 600 MG 12 hr tablet Take 2 tablets (1,200 mg total) by mouth 2 (two) times daily. 12/26/16   Mechele Claude, MD  HYDROcodone-acetaminophen (NORCO/VICODIN) 5-325 MG tablet Take 1 tablet by mouth every 6 (six) hours as needed for severe pain. 07/23/17   Idol, Raynelle Fanning, PA-C  LORazepam (ATIVAN) 1 MG tablet Take 1 tablet (1 mg total) by mouth every 8 (eight) hours as needed for anxiety. 07/03/17   Mechele Claude, MD  metoprolol succinate (TOPROL-XL) 50 MG 24 hr tablet Take 1 tablet (50 mg total) by mouth daily. Take with or immediately following a meal. 08/09/17 11/07/17  Hochrein,  Fayrene Fearing, MD  nitroGLYCERIN (NITROSTAT) 0.4 MG SL tablet Place 1 tablet (0.4 mg total) under the tongue every 5 (five) minutes as needed for chest pain. 08/09/17 11/07/17  Rollene Rotunda, MD  pravastatin (PRAVACHOL) 40 MG tablet TAKE 1 TABLET (40 MG TOTAL) BY MOUTH DAILY. 08/09/17   Rollene Rotunda, MD  tamsulosin (FLOMAX) 0.4 MG CAPS capsule Take 1 capsule (0.4 mg total) by mouth daily. 05/26/17   Mechele Claude, MD  traMADol (ULTRAM) 50 MG tablet TAKE 1 TABLET EVERY 8 HOURS AS NEEDED FOR MODERATE PAIN 07/03/17   Mechele Claude, MD  warfarin (COUMADIN) 5 MG tablet Take 1 tablet (5 mg total) by mouth daily. 08/09/17   Rollene Rotunda, MD    Family History Family  History  Problem Relation Age of Onset  . Cancer Mother   . Asthma Father   . Tuberculosis Father   . Heart attack Brother        same mother.  MI in his 30s  . Coronary artery disease Other   . COPD Daughter   . Thyroid disease Daughter     Social History Social History   Tobacco Use  . Smoking status: Former Smoker    Packs/day: 2.50    Years: 25.00    Pack years: 62.50    Types: Cigarettes    Last attempt to quit: 02/23/1991    Years since quitting: 26.8  . Smokeless tobacco: Never Used  Substance Use Topics  . Alcohol use: No    Alcohol/week: 0.0 oz  . Drug use: No     Allergies   Patient has no known allergies.   Review of Systems Review of Systems  All other systems reviewed and are negative.    Physical Exam Updated Vital Signs BP (!) 166/77   Pulse 89   Temp 97.8 F (36.6 C) (Oral)   Resp (!) 24   Ht 1.727 m (5\' 8" )   Wt 92.5 kg (204 lb)   SpO2 98%   BMI 31.02 kg/m   Physical Exam  Constitutional: No distress.  Smells of urine   HENT:  Head: Normocephalic and atraumatic.  Right Ear: External ear normal.  Left Ear: External ear normal.  Eyes: Conjunctivae are normal. Right eye exhibits no discharge. Left eye exhibits no discharge. No scleral icterus.  Neck: Neck supple. No tracheal deviation present.  Cardiovascular: Normal rate, regular rhythm and intact distal pulses.  Pulmonary/Chest: Effort normal. No stridor. No respiratory distress. He has wheezes. He has no rales.  Abdominal: Soft. Bowel sounds are normal. He exhibits no distension. There is no tenderness. There is no rebound and no guarding.  Musculoskeletal: He exhibits no edema or tenderness.  Neurological: He is alert. He displays no tremor. No cranial nerve deficit (no facial droop, extraocular movements intact, no slurred speech) or sensory deficit. He exhibits normal muscle tone. He displays no seizure activity.  Generalized weakness however the patient is able to move his arms  and legs equally, no difficulty lifting him off the bed  Skin: Skin is warm and dry. No rash noted. He is not diaphoretic.  Psychiatric: He has a normal mood and affect.  Nursing note and vitals reviewed.    ED Treatments / Results  Labs (all labs ordered are listed, but only abnormal results are displayed) Labs Reviewed  COMPREHENSIVE METABOLIC PANEL - Abnormal; Notable for the following components:      Result Value   Chloride 95 (*)    CO2 38 (*)    Glucose,  Bld 115 (*)    Albumin 2.7 (*)    All other components within normal limits  CBC WITH DIFFERENTIAL/PLATELET - Abnormal; Notable for the following components:   RBC 3.85 (*)    Hemoglobin 11.9 (*)    MCV 105.7 (*)    MCHC 29.2 (*)    Lymphs Abs 0.5 (*)    All other components within normal limits  BLOOD GAS, ARTERIAL - Abnormal; Notable for the following components:   pH, Arterial 7.253 (*)    pCO2 arterial 88.2 (*)    pO2, Arterial 81.7 (*)    Bicarbonate 31.2 (*)    Acid-Base Excess 10.4 (*)    All other components within normal limits  CULTURE, BLOOD (ROUTINE X 2)  CULTURE, BLOOD (ROUTINE X 2)  PROTIME-INR  URINALYSIS, ROUTINE W REFLEX MICROSCOPIC  I-STAT CG4 LACTIC ACID, ED  I-STAT CG4 LACTIC ACID, ED    EKG  EKG Interpretation None       Radiology Dg Chest 2 View  Result Date: 12/23/2017 CLINICAL DATA:  Altered mental status, wheezing, unable to cooperate EXAM: CHEST - 2 VIEW COMPARISON:  01/08/2016 FINDINGS: Course interstitial and patchy airspace opacities in both lung bases, increased since previous exam. Somewhat attenuated bronchovascular markings in the upper lobes. Heart size upper limits normal. Aortic Atherosclerosis (ICD10-170.0) No definite effusion on these supine radiographs. No pneumothorax evident. Visualized bones unremarkable. IMPRESSION: 1. Slight worsening of bibasilar interstitial and patchy airspace opacities since prior study. Electronically Signed   By: Corlis Leak  Hassell M.D.   On:  12/23/2017 13:50    Procedures .Critical Care Performed by: Linwood DibblesKnapp, Everlee Quakenbush, MD Authorized by: Linwood DibblesKnapp, Jayley Hustead, MD   Critical care provider statement:    Critical care time (minutes):  35   Critical care was time spent personally by me on the following activities:  Discussions with consultants, evaluation of patient's response to treatment, examination of patient, ordering and performing treatments and interventions, ordering and review of laboratory studies, ordering and review of radiographic studies, pulse oximetry, re-evaluation of patient's condition, obtaining history from patient or surrogate and review of old charts   (including critical care time)  Medications Ordered in ED Medications  cefTRIAXone (ROCEPHIN) 1 g in dextrose 5 % 50 mL IVPB (not administered)  azithromycin (ZITHROMAX) tablet 500 mg (not administered)  sodium chloride 0.9 % bolus 1,000 mL (1,000 mLs Intravenous New Bag/Given 12/23/17 1312)     Initial Impression / Assessment and Plan / ED Course  I have reviewed the triage vital signs and the nursing notes.  Pertinent labs & imaging results that were available during my care of the patient were reviewed by me and considered in my medical decision making (see chart for details).  Clinical Course as of Dec 24 1443  Sat Dec 23, 2017  1329 Preliminary ABG results show a respiratory acidosis.  I have ordered BiPAP.  [JK]  1428 Patient's laboratory tests are notable for a drop in his hemoglobin compared to previous levels.  Patient's serum bicarb level is chronically elevated consistent with a chronic respiratory acidosis.  Chest x-ray shows worsening airspace opacities.  [JK]  1444 Pt's mental status is unchanged.  He will wake up when I speak to him.  Tolerating the bipap  [JK]  1444 Discussed findings with family.  I mentioned intubation if things worsened.  Daughter states that in the past he did not want that.  [JK]    Clinical Course User Index [JK] Linwood DibblesKnapp, Micheal Sheen, MD     Patient presents  emergency room for evaluation of altered mental status.  His ABG is notable for an acute on chronic respiratory acidosis.  This is the likely culprit of his mental status changes.  Chest x-ray suggest the possibility of pneumonia.  She was started on BiPAP for his acidosis.  We will need to monitor closely but I do not think he requires intubation at this time.  He otherwise remains hemodynamically stable.  Plan admission to the hospital  Final Clinical Impressions(s) / ED Diagnoses   Final diagnoses:  Acute on chronic respiratory acidosis  Altered mental status, unspecified altered mental status type  COPD exacerbation (HCC)        Linwood Dibbles, MD 12/23/17 1445

## 2017-12-24 ENCOUNTER — Inpatient Hospital Stay (HOSPITAL_COMMUNITY): Payer: Medicare Other

## 2017-12-24 DIAGNOSIS — J449 Chronic obstructive pulmonary disease, unspecified: Secondary | ICD-10-CM

## 2017-12-24 DIAGNOSIS — D649 Anemia, unspecified: Secondary | ICD-10-CM

## 2017-12-24 DIAGNOSIS — I509 Heart failure, unspecified: Secondary | ICD-10-CM

## 2017-12-24 DIAGNOSIS — T50905A Adverse effect of unspecified drugs, medicaments and biological substances, initial encounter: Secondary | ICD-10-CM

## 2017-12-24 LAB — CBC WITH DIFFERENTIAL/PLATELET
Basophils Absolute: 0 10*3/uL (ref 0.0–0.1)
Basophils Relative: 0 %
EOS ABS: 0 10*3/uL (ref 0.0–0.7)
EOS PCT: 0 %
HCT: 40.7 % (ref 39.0–52.0)
HEMOGLOBIN: 12.2 g/dL — AB (ref 13.0–17.0)
Lymphocytes Relative: 7 %
Lymphs Abs: 0.4 10*3/uL — ABNORMAL LOW (ref 0.7–4.0)
MCH: 30.6 pg (ref 26.0–34.0)
MCHC: 30 g/dL (ref 30.0–36.0)
MCV: 102 fL — ABNORMAL HIGH (ref 78.0–100.0)
MONOS PCT: 1 %
Monocytes Absolute: 0 10*3/uL — ABNORMAL LOW (ref 0.1–1.0)
NEUTROS PCT: 92 %
Neutro Abs: 5.9 10*3/uL (ref 1.7–7.7)
PLATELETS: 299 10*3/uL (ref 150–400)
RBC: 3.99 MIL/uL — AB (ref 4.22–5.81)
RDW: 12 % (ref 11.5–15.5)
WBC: 6.4 10*3/uL (ref 4.0–10.5)

## 2017-12-24 LAB — COMPREHENSIVE METABOLIC PANEL
ALK PHOS: 78 U/L (ref 38–126)
ALT: 17 U/L (ref 17–63)
ANION GAP: 12 (ref 5–15)
AST: 24 U/L (ref 15–41)
Albumin: 2.7 g/dL — ABNORMAL LOW (ref 3.5–5.0)
BUN: 19 mg/dL (ref 6–20)
CALCIUM: 9.9 mg/dL (ref 8.9–10.3)
CO2: 35 mmol/L — ABNORMAL HIGH (ref 22–32)
Chloride: 98 mmol/L — ABNORMAL LOW (ref 101–111)
Creatinine, Ser: 0.8 mg/dL (ref 0.61–1.24)
Glucose, Bld: 158 mg/dL — ABNORMAL HIGH (ref 65–99)
Potassium: 4.6 mmol/L (ref 3.5–5.1)
Sodium: 145 mmol/L (ref 135–145)
TOTAL PROTEIN: 7 g/dL (ref 6.5–8.1)
Total Bilirubin: 0.5 mg/dL (ref 0.3–1.2)

## 2017-12-24 LAB — ECHOCARDIOGRAM COMPLETE
Height: 68 in
WEIGHTICAEL: 3326.3 [oz_av]

## 2017-12-24 LAB — GLUCOSE, CAPILLARY
GLUCOSE-CAPILLARY: 123 mg/dL — AB (ref 65–99)
Glucose-Capillary: 122 mg/dL — ABNORMAL HIGH (ref 65–99)
Glucose-Capillary: 95 mg/dL (ref 65–99)

## 2017-12-24 LAB — MAGNESIUM: Magnesium: 1.9 mg/dL (ref 1.7–2.4)

## 2017-12-24 LAB — TROPONIN I: Troponin I: <0.03 ng/mL (ref <0.03)

## 2017-12-24 LAB — MRSA PCR SCREENING: MRSA by PCR: NEGATIVE

## 2017-12-24 MED ORDER — LEVALBUTEROL HCL 0.63 MG/3ML IN NEBU
0.6300 mg | INHALATION_SOLUTION | Freq: Three times a day (TID) | RESPIRATORY_TRACT | Status: DC
Start: 2017-12-24 — End: 2017-12-24
  Administered 2017-12-24: 0.63 mg via RESPIRATORY_TRACT

## 2017-12-24 MED ORDER — METOPROLOL TARTRATE 5 MG/5ML IV SOLN
5.0000 mg | INTRAVENOUS | Status: AC | PRN
  Administered 2017-12-24 (×3): 5 mg via INTRAVENOUS
  Filled 2017-12-24 (×3): qty 5

## 2017-12-24 MED ORDER — IPRATROPIUM BROMIDE 0.02 % IN SOLN
0.5000 mg | Freq: Three times a day (TID) | RESPIRATORY_TRACT | Status: DC
Start: 2017-12-24 — End: 2017-12-24
  Filled 2017-12-24: qty 2.5

## 2017-12-24 MED ORDER — IPRATROPIUM BROMIDE 0.02 % IN SOLN
0.5000 mg | Freq: Three times a day (TID) | RESPIRATORY_TRACT | Status: DC
  Administered 2017-12-24 – 2017-12-26 (×7): 0.5 mg via RESPIRATORY_TRACT
  Filled 2017-12-24 (×6): qty 2.5

## 2017-12-24 MED ORDER — LEVALBUTEROL HCL 0.63 MG/3ML IN NEBU
0.6300 mg | INHALATION_SOLUTION | Freq: Three times a day (TID) | RESPIRATORY_TRACT | Status: DC
  Administered 2017-12-24 – 2017-12-26 (×7): 0.63 mg via RESPIRATORY_TRACT
  Filled 2017-12-24 (×7): qty 3

## 2017-12-24 MED ORDER — IPRATROPIUM BROMIDE 0.02 % IN SOLN
0.5000 mg | Freq: Three times a day (TID) | RESPIRATORY_TRACT | Status: DC
  Administered 2017-12-24: 0.5 mg via RESPIRATORY_TRACT

## 2017-12-24 MED ORDER — LEVALBUTEROL HCL 0.63 MG/3ML IN NEBU
0.6300 mg | INHALATION_SOLUTION | Freq: Three times a day (TID) | RESPIRATORY_TRACT | Status: DC
  Filled 2017-12-24: qty 3

## 2017-12-24 MED ORDER — METHYLPREDNISOLONE SODIUM SUCC 40 MG IJ SOLR
40.0000 mg | Freq: Four times a day (QID) | INTRAMUSCULAR | Status: DC
  Administered 2017-12-24 – 2017-12-26 (×9): 40 mg via INTRAVENOUS
  Filled 2017-12-24 (×9): qty 1

## 2017-12-24 MED ORDER — LORAZEPAM 1 MG PO TABS
1.0000 mg | ORAL_TABLET | Freq: Three times a day (TID) | ORAL | Status: DC | PRN
  Administered 2017-12-24 – 2017-12-25 (×2): 1 mg via ORAL
  Filled 2017-12-24 (×2): qty 1

## 2017-12-24 MED ORDER — INSULIN ASPART 100 UNIT/ML ~~LOC~~ SOLN
2.0000 [IU] | Freq: Three times a day (TID) | SUBCUTANEOUS | Status: DC
  Administered 2017-12-25 (×2): 2 [IU] via SUBCUTANEOUS

## 2017-12-24 NOTE — Progress Notes (Signed)
A-fib rate 135-145 on monitor, Dr Sherryll BurgerShah notified. Pt denies pain @ this time. Hx a fib. Lopressor 5 mg IVP given per order. HR decreased to 100.

## 2017-12-24 NOTE — Progress Notes (Signed)
Short run of afib rate 180. Possible ST elevation noted to monitor. Dr Sherryll BurgerShah notified. EKG done and stat troponin ordered. Pt in no distress at this time. Will continue to monitor.

## 2017-12-24 NOTE — Progress Notes (Addendum)
PROGRESS NOTE    Logan West  AVW:[id number]  DOB: [birth year]  DOA: 12/23/2017 PCP: Mechele Claude, MD   Brief Admission Hx: Logan West is a 84 y.o. male former smoker with known COPD, chronic atrial fibrillation, hypertension, obesity and other past medical history detailed below presented to the emergency department by EMS when family discovered the patient was expressing weakness and confusion.  MDM/Assessment & Plan:    1. Acute respiratory failure with hypercapnia- continue BiPAP treatment.  The patient is very clear that he does not want to be intubated.    He is off BiPAP at this time and on nasal cannula however he is tachypneic.  Treating COPD exacerbation aggressively.  Continues oxygen support.  Continue IV antibiotics and follow in the stepdown unit.  Lorazepam ordered for sedation and anxiety as needed.  2. Acute change in mental status -much improved.  Suspect secondary to acute respiratory acidosis and hypercarbia, he does not have neurological changes to suggest CVA although he does have stroke risk because of Afib and doesn't seem to be anticoagulated (INR 1.09).  He is slowly improving on bipap.  3. Acute exacerbation of COPD-unfortunately patient has a lot of secondhand smoke exposure in the house although the patient stopped smoking 20 years ago.  He did smoke for 20+ years.  He is being admitted for IV steroids, nebulizer treatments, IV antibiotics, BiPAP therapy. 4. Atypical community acquired pneumonia -continue IV levofloxacin ordered.  5. Acute respiratory acidosis- this likely is the immediate cause of his altered mental status and hopefully with improvement in the PCO2 this will resolve and he will return to his baseline.    He seems to be much improved this morning and seems to be mentating well and likely at his baseline. 6. Chronic atrial fibrillation- the patient had a brief episode of A. fib RVR in the early morning hours that responded to a couple of doses of  metoprolol IV.  He has been restarted on all of his home medications and heart rate has remained stable.  Beta 2 agonist has been changed to Xopenex.  Per cardiology records, patient was supposed to be anticoagulated with warfarin because of CHADVASC score of 4 and 4% risk of stroke the patient's daughter tells me that the patient decided not to take warfarin and has never taken it.  He understands the risk of stroke and want to follow up with cardiologist to discuss further.   7. Prediabetes-check hemoglobin A1c 4.9%, suspect hyperglycemia associated with stress and IV steroid use, sliding scale insulin coverage ordered in addition to prandial insulin coverage.  Follow CBG closely. 8. DNR-the daughter conveyed to me that the patient has long been a DO NOT RESUSCITATE/DO NOT INTUBATE.  He does not seem to be happy with the BiPAP.  I explained to him that the BiPAP is temporary and it does not involve a tube down the throat.  I hope that he will tolerate and allow [place] to continue the BiPAP therapy until he has improved.  He will be monitored in the stepdown unit. 9. Chronic combined systolic and diastolic congestive heart failure- I reviewed his echocardiogram and it showed a preserved EF of 50-55% and unable to determine diastolic function due to it being a technically inadequate study for that assessment.  DVT Prophylaxis: Warfarin Code Status: DNR Family Communication: Daughter at bedside Disposition Plan: To be determined   Subjective: Patient off the BiPAP and reports that he is starting to feel better.  He is having some  anxiety.  He is asking for his anxiety medication.  He denies shortness of breath and chest pain.  He is very shallow breathing and speaking in short sentences.  Objective: Vitals:   12/24/17 0740 12/24/17 0749 12/24/17 1056 12/24/17 1416  BP:      Pulse: (!) 107     Resp: (!) 21  (!) 22   Temp: 97.7 F (36.5 C)  98.3 F (36.8 C)   TempSrc: Oral  Oral   SpO2: 95% 95%   99%  Weight:      Height:        Intake/Output Summary (Last 24 hours) at 12/24/2017 1432 Last data filed at 12/24/2017 0500 Gross per 24 hour  Intake 369.5 ml  Output 900 ml  Net -530.5 ml   Filed Weights   12/23/17 1226 12/23/17 1636 12/24/17 0500  Weight: 92.5 kg (204 lb) 94.2 kg (207 lb 10.8 oz) 94.3 kg (207 lb 14.3 oz)     REVIEW OF SYSTEMS  As per history otherwise all reviewed and reported negative  Exam:  General exam: Awake, alert, no apparent distress, shallow breathing and short sentence speaking. Respiratory system: Tachypneic, diffuse inspiratory expiratory wheezes and rales, poor air movement.  Cardiovascular system: S1 & S2 heard, tachycardic no JVD, murmurs, gallops, clicks or pedal edema. Gastrointestinal system: Abdomen is nondistended, soft and nontender. Normal bowel sounds heard. Central nervous system: Alert and oriented. No focal neurological deficits. Extremities: no CCE.  Data Reviewed: Basic Metabolic Panel: Recent Labs  Lab 12/23/17 1318 12/24/17 0446  NA 142 145  K 5.1 4.6  CL 95* 98*  CO2 38* 35*  GLUCOSE 115* 158*  BUN 16 19  CREATININE 0.68 0.80  CALCIUM 9.2 9.9  MG  --  1.9   Liver Function Tests: Recent Labs  Lab 12/23/17 1318 12/24/17 0446  AST 15 24  ALT 18 17  ALKPHOS 80 78  BILITOT 0.7 0.5  PROT 7.1 7.0  ALBUMIN 2.7* 2.7*   No results for input(s): LIPASE, AMYLASE in the last 168 hours. No results for input(s): AMMONIA in the last 168 hours. CBC: Recent Labs  Lab 12/23/17 1318 12/24/17 0446  WBC 6.5 6.4  NEUTROABS 5.9 5.9  HGB 11.9* 12.2*  HCT 40.7 40.7  MCV 105.7* 102.0*  PLT 252 299   Cardiac Enzymes: Recent Labs  Lab 12/24/17 0446  TROPONINI <0.03   CBG (last 3)  Recent Labs    12/23/17 2104 12/24/17 0742 12/24/17 1059  GLUCAP 119* 122* 123*   Recent Results (from the past 240 hour(s))  Culture, blood (Routine x 2)     Status: None (Preliminary result)   Collection Time: 12/23/17  1:19 PM    Result Value Ref Range Status   Specimen Description RIGHT ANTECUBITAL  Final   Special Requests   Final    BOTTLES DRAWN AEROBIC AND ANAEROBIC Blood Culture adequate volume   Culture   Final    NO GROWTH < 24 HOURS Performed at [facility], [address], [place], [place] [address]    Report Status PENDING  Incomplete  Culture, blood (Routine x 2)     Status: None (Preliminary result)   Collection Time: 12/23/17  1:19 PM  Result Value Ref Range Status   Specimen Description BLOOD  Final   Special Requests NONE  Final   Culture   Final    NO GROWTH < 24 HOURS Performed at [facility], [address] [place], [place] [address]    Report Status  PENDING  Incomplete  MRSA PCR Screening     Status: None   Collection Time: 12/23/17  5:00 PM  Result Value Ref Range Status   MRSA by PCR NEGATIVE NEGATIVE Final    Comment:        The GeneXpert MRSA Assay (FDA approved for NASAL specimens only), is one component of a comprehensive MRSA colonization surveillance program. It is not intended to diagnose MRSA infection nor to guide or monitor treatment for MRSA infections. Performed at [facility], [address] [place], [place] [address]      Studies: Dg Chest 2 View  Result Date: 12/23/2017 CLINICAL DATA:  Altered mental status, wheezing, unable to cooperate EXAM: CHEST - 2 VIEW COMPARISON:  01/08/2016 FINDINGS: Course interstitial and patchy airspace opacities in both lung bases, increased since previous exam. Somewhat attenuated bronchovascular markings in the upper lobes. Heart size upper limits normal. Aortic Atherosclerosis (ICD10-170.0) No definite effusion on these supine radiographs. No pneumothorax evident. Visualized bones unremarkable. IMPRESSION: 1. Slight worsening of bibasilar interstitial and patchy airspace opacities since prior study. Electronically Signed   By: Corlis Leak  Hassell M.D.   On: 12/23/2017 13:50   Dg Chest Port 1 View  Result Date: 12/24/2017 CLINICAL DATA:   Acute respiratory failure with hypercapnia. EXAM: PORTABLE CHEST 1 VIEW COMPARISON:  12/23/2017 FINDINGS: The cardiac silhouette is borderline enlarged. Aortic atherosclerosis is noted. Patchy airspace and interstitial opacities in both lung bases have not significantly changed. Trace pleural effusions are questioned. No pneumothorax is identified. IMPRESSION: Unchanged patchy bibasilar atelectasis or pneumonia. Possible tiny pleural effusions. Electronically Signed   By: Sebastian AcheAllen  Grady M.D.   On: 12/24/2017 10:05   Scheduled Meds: . budesonide (PULMICORT) nebulizer solution  0.25 mg Nebulization BID  . diltiazem  180 mg Oral Daily  . furosemide  20 mg Oral QODAY  . guaiFENesin  1,200 mg Oral BID  . insulin aspart  0-20 Units Subcutaneous TID WC  . insulin aspart  0-5 Units Subcutaneous QHS  . insulin aspart  2 Units Subcutaneous TID WC  . ipratropium  0.5 mg Nebulization TID  . levalbuterol  0.63 mg Nebulization TID  . methylPREDNISolone (SOLU-MEDROL) injection  40 mg Intravenous Q6H  . metoprolol succinate  50 mg Oral Daily  . pantoprazole  40 mg Oral Q0600  . pravastatin  40 mg Oral q1800  . sodium chloride flush  3 mL Intravenous Q12H  . tamsulosin  0.4 mg Oral QODAY   Continuous Infusions: . sodium chloride 250 mL (12/23/17 1703)  . levofloxacin (LEVAQUIN) IV Stopped (12/24/17 1307)    Principal Problem:   Acute respiratory failure with hypercapnia (HCC) Active Problems:   Acute respiratory acidosis   Acute exacerbation of chronic obstructive pulmonary disease (COPD) (HCC)   Chronic pain syndrome   Chronic combined systolic and diastolic congestive heart failure (HCC)   Chronic atrial fibrillation (HCC)   COPD mixed type (HCC)   Prediabetes   DNR (do not resuscitate)   Anemia, unspecified   Hyperglycemia   Critical care time spent: 35 minutes  Standley Dakinslanford Akaylah Lalley, MD, FAAFP [facility] Pager [phone]  If 7PM-7AM, please contact  night-coverage [email or web address] Password [email or web address] 12/24/2017, 2:32 PM    LOS: 1 day

## 2017-12-24 NOTE — Progress Notes (Signed)
*  PRELIMINARY RESULTS* Echocardiogram 2D Echocardiogram has been performed.  Jeryl Columbialliott, Coni Homesley 12/24/2017, 10:58 AM

## 2017-12-25 ENCOUNTER — Telehealth (HOSPITAL_COMMUNITY): Payer: Self-pay | Admitting: Emergency Medicine

## 2017-12-25 LAB — COMPREHENSIVE METABOLIC PANEL
ALT: 16 U/L — AB (ref 17–63)
AST: 22 U/L (ref 15–41)
Albumin: 2.6 g/dL — ABNORMAL LOW (ref 3.5–5.0)
Alkaline Phosphatase: 63 U/L (ref 38–126)
Anion gap: 12 (ref 5–15)
BUN: 24 mg/dL — AB (ref 6–20)
CHLORIDE: 93 mmol/L — AB (ref 101–111)
CO2: 33 mmol/L — AB (ref 22–32)
CREATININE: 0.87 mg/dL (ref 0.61–1.24)
Calcium: 9.5 mg/dL (ref 8.9–10.3)
GFR calc Af Amer: >60 mL/min (ref >60)
GFR calc non Af Amer: >60 mL/min (ref >60)
Glucose, Bld: 122 mg/dL — ABNORMAL HIGH (ref 65–99)
POTASSIUM: 4.3 mmol/L (ref 3.5–5.1)
SODIUM: 138 mmol/L (ref 135–145)
Total Bilirubin: 0.4 mg/dL (ref 0.3–1.2)
Total Protein: 6.2 g/dL — ABNORMAL LOW (ref 6.5–8.1)

## 2017-12-25 LAB — CBC WITH DIFFERENTIAL/PLATELET
BASOS ABS: 0 10*3/uL (ref 0.0–0.1)
BASOS PCT: 0 %
EOS ABS: 0 10*3/uL (ref 0.0–0.7)
EOS PCT: 0 %
HCT: 38.8 % — ABNORMAL LOW (ref 39.0–52.0)
Hemoglobin: 11.8 g/dL — ABNORMAL LOW (ref 13.0–17.0)
Lymphocytes Relative: 6 %
Lymphs Abs: 0.6 10*3/uL — ABNORMAL LOW (ref 0.7–4.0)
MCH: 30.5 pg (ref 26.0–34.0)
MCHC: 30.4 g/dL (ref 30.0–36.0)
MCV: 100.3 fL — ABNORMAL HIGH (ref 78.0–100.0)
MONO ABS: 0.1 10*3/uL (ref 0.1–1.0)
MONOS PCT: 1 %
NEUTROS ABS: 10.5 10*3/uL — AB (ref 1.7–7.7)
Neutrophils Relative %: 93 %
PLATELETS: 306 10*3/uL (ref 150–400)
RBC: 3.87 MIL/uL — ABNORMAL LOW (ref 4.22–5.81)
RDW: 12.3 % (ref 11.5–15.5)
WBC: 11.3 10*3/uL — ABNORMAL HIGH (ref 4.0–10.5)

## 2017-12-25 LAB — MAGNESIUM: Magnesium: 1.9 mg/dL (ref 1.7–2.4)

## 2017-12-25 LAB — GLUCOSE, CAPILLARY
GLUCOSE-CAPILLARY: 109 mg/dL — AB (ref 65–99)
GLUCOSE-CAPILLARY: 123 mg/dL — AB (ref 65–99)
GLUCOSE-CAPILLARY: 128 mg/dL — AB (ref 65–99)
Glucose-Capillary: 136 mg/dL — ABNORMAL HIGH (ref 65–99)
Glucose-Capillary: 123 mg/dL — ABNORMAL HIGH (ref 65–99)
Glucose-Capillary: 130 mg/dL — ABNORMAL HIGH (ref 65–99)

## 2017-12-25 NOTE — Progress Notes (Signed)
PROGRESS NOTE    Logan West  VQQ:[id number]  DOB: 06-05-1934  DOA: 12/23/2017 PCP: Mechele Claude, MD   Brief Admission Hx: Logan West is a 84 y.o. male former smoker with known COPD, chronic atrial fibrillation, hypertension, obesity and other past medical history detailed below presented to the emergency department by EMS when family discovered the patient was expressing weakness and confusion.  MDM/Assessment & Plan:    1. Acute respiratory failure with hypercapnia- continue BiPAP treatment.  The patient is very clear that he does not want to be intubated.    He is off BiPAP at this time and on nasal cannula however he is tachypneic.  Treating COPD exacerbation aggressively.  Continues oxygen support.  Continue IV antibiotics and follow in the stepdown unit.  Lorazepam ordered for sedation and anxiety as needed. Hopefully can move to floor tomorrow.   2. Acute change in mental status -much improved.  Suspect secondary to acute respiratory acidosis and hypercarbia, he does not have neurological changes to suggest CVA although he does have stroke risk because of Afib and doesn't seem to be anticoagulated (INR 1.09).  He is slowly improving on bipap.  3. Acute exacerbation of COPD-unfortunately patient has a lot of secondhand smoke exposure in the house although the patient stopped smoking 20 years ago.  He did smoke for 20+ years.  He is being admitted for IV steroids, nebulizer treatments, IV antibiotics, BiPAP therapy. 4. Atypical community acquired pneumonia -continue IV levofloxacin ordered.  5. Acute respiratory acidosis- this likely is the immediate cause of his altered mental status and hopefully with improvement in the PCO2 this will resolve and he will return to his baseline.    He seems to be much improved this morning and seems to be mentating well and likely at his baseline. 6. Chronic atrial fibrillation- the patient had a brief episode of A. fib RVR in the early morning hours  that responded to a couple of doses of metoprolol IV.  He has been restarted on all of his home medications and heart rate has remained stable.  Beta 2 agonist has been changed to Xopenex.  Per cardiology records, patient was supposed to be anticoagulated with warfarin because of CHADVASC score of 4 and 4% risk of stroke the patient's daughter tells me that the patient decided not to take warfarin and has never taken it.  He understands the risk of stroke and want to follow up with outpatient cardiologist to discuss further.   7. Prediabetes-check hemoglobin A1c 4.9%, suspect hyperglycemia associated with stress and IV steroid use, sliding scale insulin coverage ordered in addition to prandial insulin coverage.  Follow CBG closely. 8. DNR-the daughter conveyed to me that the patient has long been a DO NOT RESUSCITATE/DO NOT INTUBATE.  He does not seem to be happy with the BiPAP.  I explained to him that the BiPAP is temporary and it does not involve a tube down the throat.  I hope that he will tolerate and allow [place] to continue the BiPAP therapy until he has improved.  He will be monitored in the stepdown unit. 9. Chronic combined systolic and diastolic congestive heart failure- I reviewed his echocardiogram and it showed a preserved EF of 50-55% and unable to determine diastolic function due to it being a technically inadequate study for that assessment.  DVT Prophylaxis: Warfarin Code Status: DNR Family Communication: Daughter Disposition Plan: To be determined  Subjective: Pt on bipap this morning but says that he really wants to go  home.  He is alert and oriented x 3 this morning, RN reports that he did have some confusion during the night.    Objective: Vitals:   12/25/17 0808 12/25/17 0815 12/25/17 0900 12/25/17 1000  BP:   (!) 144/73 (!) 147/76  Pulse:   97 99  Resp:   (!) 23 (!) 23  Temp:      TempSrc:      SpO2: 98% 96% 98% 100%  Weight:      Height:        Intake/Output Summary  (Last 24 hours) at 12/25/2017 1050 Last data filed at 12/25/2017 0900 Gross per 24 hour  Intake 510 ml  Output 1100 ml  Net -590 ml   Filed Weights   12/23/17 1636 12/24/17 0500 12/25/17 0500  Weight: 94.2 kg (207 lb 10.8 oz) 94.3 kg (207 lb 14.3 oz) 95.4 kg (210 lb 5.1 oz)     REVIEW OF SYSTEMS  As per history otherwise all reviewed and reported negative  Exam:  General exam: Awake, alert, no apparent distress, shallow breathing and short sentence speaking. Respiratory system: Tachypneic, diffuse inspiratory expiratory wheezes and rales, poor air movement.  Cardiovascular system: S1 & S2 heard, tachycardic no JVD, murmurs, gallops, clicks or pedal edema. Gastrointestinal system: Abdomen is nondistended, soft and nontender. Normal bowel sounds heard. Central nervous system: Alert and oriented. No focal neurological deficits. Extremities: no CCE.  Data Reviewed: Basic Metabolic Panel: Recent Labs  Lab 12/23/17 1318 12/24/17 0446 12/25/17 0410  NA 142 145 138  K 5.1 4.6 4.3  CL 95* 98* 93*  CO2 38* 35* 33*  GLUCOSE 115* 158* 122*  BUN 16 19 24*  CREATININE 0.68 0.80 0.87  CALCIUM 9.2 9.9 9.5  MG  --  1.9 1.9   Liver Function Tests: Recent Labs  Lab 12/23/17 1318 12/24/17 0446 12/25/17 0410  AST 15 24 22   ALT 18 17 16*  ALKPHOS 80 78 63  BILITOT 0.7 0.5 0.4  PROT 7.1 7.0 6.2*  ALBUMIN 2.7* 2.7* 2.6*   No results for input(s): LIPASE, AMYLASE in the last 168 hours. No results for input(s): AMMONIA in the last 168 hours. CBC: Recent Labs  Lab 12/23/17 1318 12/24/17 0446 12/25/17 0410  WBC 6.5 6.4 11.3*  NEUTROABS 5.9 5.9 10.5*  HGB 11.9* 12.2* 11.8*  HCT 40.7 40.7 38.8*  MCV 105.7* 102.0* 100.3*  PLT 252 299 306   Cardiac Enzymes: Recent Labs  Lab 12/24/17 0446  TROPONINI <0.03   CBG (last 3)  Recent Labs    12/25/17 0013 12/25/17 0401 12/25/17 0738  GLUCAP 123* 136* 123*   Recent Results (from the past 240 hour(s))  Culture, blood (Routine  x 2)     Status: None (Preliminary result)   Collection Time: 12/23/17  1:19 PM  Result Value Ref Range Status   Specimen Description RIGHT ANTECUBITAL  Final   Special Requests   Final    BOTTLES DRAWN AEROBIC AND ANAEROBIC Blood Culture adequate volume   Culture   Final    NO GROWTH 2 DAYS Performed at [facility], [address] [place], [place] [address]    Report Status PENDING  Incomplete  Culture, blood (Routine x 2)     Status: None (Preliminary result)   Collection Time: 12/23/17  1:19 PM  Result Value Ref Range Status   Specimen Description BLOOD LEFT ARM  Final   Special Requests   Final    BOTTLES DRAWN AEROBIC AND ANAEROBIC Blood Culture adequate  volume   Culture   Final    NO GROWTH 2 DAYS Performed at [facility], [address], [place], [place] [address]    Report Status PENDING  Incomplete  MRSA PCR Screening     Status: None   Collection Time: 12/23/17  5:00 PM  Result Value Ref Range Status   MRSA by PCR NEGATIVE NEGATIVE Final    Comment:        The GeneXpert MRSA Assay (FDA approved for NASAL specimens only), is one component of a comprehensive MRSA colonization surveillance program. It is not intended to diagnose MRSA infection nor to guide or monitor treatment for MRSA infections. Performed at [facility], [address], [place], [place] [address]      Studies: Dg Chest 2 View  Result Date: 12/23/2017 CLINICAL DATA:  Altered mental status, wheezing, unable to cooperate EXAM: CHEST - 2 VIEW COMPARISON:  01/08/2016 FINDINGS: Course interstitial and patchy airspace opacities in both lung bases, increased since previous exam. Somewhat attenuated bronchovascular markings in the upper lobes. Heart size upper limits normal. Aortic Atherosclerosis (ICD10-170.0) No definite effusion on these supine radiographs. No pneumothorax evident. Visualized bones unremarkable. IMPRESSION: 1. Slight worsening of bibasilar interstitial and patchy airspace opacities since  prior study. Electronically Signed   By: Corlis Leak M.D.   On: 12/23/2017 13:50   Dg Chest Port 1 View  Result Date: 12/24/2017 CLINICAL DATA:  Acute respiratory failure with hypercapnia. EXAM: PORTABLE CHEST 1 VIEW COMPARISON:  12/23/2017 FINDINGS: The cardiac silhouette is borderline enlarged. Aortic atherosclerosis is noted. Patchy airspace and interstitial opacities in both lung bases have not significantly changed. Trace pleural effusions are questioned. No pneumothorax is identified. IMPRESSION: Unchanged patchy bibasilar atelectasis or pneumonia. Possible tiny pleural effusions. Electronically Signed   By: Sebastian Ache M.D.   On: 12/24/2017 10:05   Scheduled Meds: . budesonide (PULMICORT) nebulizer solution  0.25 mg Nebulization BID  . diltiazem  180 mg Oral Daily  . furosemide  20 mg Oral QODAY  . guaiFENesin  1,200 mg Oral BID  . insulin aspart  0-20 Units Subcutaneous TID WC  . insulin aspart  0-5 Units Subcutaneous QHS  . insulin aspart  2 Units Subcutaneous TID WC  . ipratropium  0.5 mg Nebulization TID  . levalbuterol  0.63 mg Nebulization TID  . methylPREDNISolone (SOLU-MEDROL) injection  40 mg Intravenous Q6H  . metoprolol succinate  50 mg Oral Daily  . pantoprazole  40 mg Oral Q0600  . pravastatin  40 mg Oral q1800  . sodium chloride flush  3 mL Intravenous Q12H  . tamsulosin  0.4 mg Oral QODAY   Continuous Infusions: . sodium chloride 250 mL (12/23/17 1703)  . levofloxacin (LEVAQUIN) IV Stopped (12/25/17 1033)    Principal Problem:   Acute respiratory failure with hypercapnia (HCC) Active Problems:   Acute respiratory acidosis   Acute exacerbation of chronic obstructive pulmonary disease (COPD) (HCC)   Chronic pain syndrome   Chronic combined systolic and diastolic congestive heart failure (HCC)   Chronic atrial fibrillation (HCC)   COPD mixed type (HCC)   Prediabetes   DNR (do not resuscitate)   Anemia, unspecified   Hyperglycemia   Critical care time  spent: 32 minutes  Standley Dakins, MD, FAAFP [facility] Pager [phone]  If 7PM-7AM, please contact night-coverage [email or web address] Password [email or web address] 12/25/2017, 10:50 AM    LOS: 2 days

## 2017-12-26 LAB — GLUCOSE, CAPILLARY
GLUCOSE-CAPILLARY: 117 mg/dL — AB (ref 65–99)
GLUCOSE-CAPILLARY: 128 mg/dL — AB (ref 65–99)
Glucose-Capillary: 112 mg/dL — ABNORMAL HIGH (ref 65–99)
Glucose-Capillary: 117 mg/dL — ABNORMAL HIGH (ref 65–99)
Glucose-Capillary: 102 mg/dL — ABNORMAL HIGH (ref 65–99)

## 2017-12-26 LAB — CBC WITH DIFFERENTIAL/PLATELET
Basophils Absolute: 0 10*3/uL (ref 0.0–0.1)
Basophils Relative: 0 %
Eosinophils Absolute: 0 10*3/uL (ref 0.0–0.7)
Eosinophils Relative: 0 %
HEMATOCRIT: 41.2 % (ref 39.0–52.0)
HEMOGLOBIN: 12.7 g/dL — AB (ref 13.0–17.0)
LYMPHS PCT: 5 %
Lymphs Abs: 0.4 10*3/uL — ABNORMAL LOW (ref 0.7–4.0)
MCH: 30.5 pg (ref 26.0–34.0)
MCHC: 30.8 g/dL (ref 30.0–36.0)
MCV: 99 fL (ref 78.0–100.0)
MONO ABS: 0.1 10*3/uL (ref 0.1–1.0)
MONOS PCT: 1 %
NEUTROS PCT: 94 %
Neutro Abs: 8.3 10*3/uL — ABNORMAL HIGH (ref 1.7–7.7)
Platelets: 280 10*3/uL (ref 150–400)
RBC: 4.16 MIL/uL — ABNORMAL LOW (ref 4.22–5.81)
RDW: 12.4 % (ref 11.5–15.5)
WBC: 8.8 10*3/uL (ref 4.0–10.5)

## 2017-12-26 LAB — COMPREHENSIVE METABOLIC PANEL
ALBUMIN: 2.6 g/dL — AB (ref 3.5–5.0)
ALT: 40 U/L (ref 17–63)
ANION GAP: 7 (ref 5–15)
AST: 41 U/L (ref 15–41)
Alkaline Phosphatase: 63 U/L (ref 38–126)
BILIRUBIN TOTAL: 0.6 mg/dL (ref 0.3–1.2)
BUN: 27 mg/dL — AB (ref 6–20)
CHLORIDE: 95 mmol/L — AB (ref 101–111)
CO2: 37 mmol/L — AB (ref 22–32)
Calcium: 9.6 mg/dL (ref 8.9–10.3)
Creatinine, Ser: 0.94 mg/dL (ref 0.61–1.24)
GFR calc Af Amer: >60 mL/min (ref >60)
GFR calc non Af Amer: >60 mL/min (ref >60)
GLUCOSE: 136 mg/dL — AB (ref 65–99)
POTASSIUM: 4.2 mmol/L (ref 3.5–5.1)
SODIUM: 139 mmol/L (ref 135–145)
Total Protein: 6.2 g/dL — ABNORMAL LOW (ref 6.5–8.1)

## 2017-12-26 LAB — MAGNESIUM: Magnesium: 2.1 mg/dL (ref 1.7–2.4)

## 2017-12-26 MED ORDER — POLYVINYL ALCOHOL 1.4 % OP SOLN
1.0000 [drp] | OPHTHALMIC | Status: DC | PRN
  Filled 2017-12-26: qty 15

## 2017-12-26 MED ORDER — LEVOFLOXACIN 750 MG PO TABS
750.0000 mg | ORAL_TABLET | Freq: Every day | ORAL | Status: DC
  Administered 2017-12-27: 750 mg via ORAL
  Filled 2017-12-26: qty 1

## 2017-12-26 MED ORDER — METHYLPREDNISOLONE SODIUM SUCC 40 MG IJ SOLR
40.0000 mg | Freq: Two times a day (BID) | INTRAMUSCULAR | Status: DC
  Administered 2017-12-27 (×2): 40 mg via INTRAVENOUS
  Filled 2017-12-26 (×2): qty 1

## 2017-12-26 NOTE — Progress Notes (Signed)
PROGRESS NOTE    Edgardo Petrenko  ZOX:[id number]  DOB: [birth year]  DOA: 12/23/2017 PCP: Mechele Claude, MD   Brief Admission Hx: Logan West is a 84 y.o. male former smoker with known COPD, chronic atrial fibrillation, hypertension, obesity and other past medical history detailed below presented to the emergency department by EMS when family discovered the patient was expressing weakness and confusion.  MDM/Assessment & Plan:    1. Acute respiratory failure with hypercapnia- weaned off BiPAP treatment. Doing better, transfer to floor.  Hopefully can discharge home soon.    2. Acute change in mental status -much improved, now back to baseline.  Suspect secondary to acute respiratory acidosis and hypercarbia, he does not have neurological changes to suggest CVA although he does have stroke risk because of Afib and doesn't seem to be anticoagulated (INR 1.09).    3. Acute exacerbation of COPD-unfortunately patient has a lot of secondhand smoke exposure in the house although the patient stopped smoking 20 years ago.  He did smoke for 20+ years.  weaning down steroids.  4. Atypical community acquired pneumonia -continue levofloxacin ordered.  5. Acute respiratory acidosis- this likely is the immediate cause of his altered mental status and hopefully with improvement in the PCO2 this will resolve and he will return to his baseline.    He seems to be much improved this morning and seems to be mentating well and likely at his baseline. 6. Chronic atrial fibrillation- the patient had a brief episode of A. fib RVR in the early morning hours that responded to a couple of doses of metoprolol IV.  He has been restarted on all of his home medications and heart rate has remained stable.  Beta 2 agonist has been changed to Xopenex.  Per cardiology records, patient was supposed to be anticoagulated with warfarin because of CHADVASC score of 4 and 4% risk of stroke the patient's daughter tells me that the patient  decided not to take warfarin and has never taken it.  He understands the risk of stroke and want to follow up with outpatient cardiologist to discuss further.   7. Prediabetes- hemoglobin A1c 4.9%, suspect hyperglycemia associated with stress and IV steroid use, sliding scale insulin coverage ordered in addition to prandial insulin coverage.  Follow CBG. 8. DNR-the daughter conveyed to me that the patient has long been a DO NOT RESUSCITATE/DO NOT INTUBATE.  9. Chronic combined systolic and diastolic congestive heart failure- I reviewed his echocardiogram and it showed a preserved EF of 50-55% and unable to determine diastolic function due to it being a technically inadequate study for that assessment.  DVT Prophylaxis: Warfarin Code Status: DNR Family Communication: Daughter Disposition Plan:  Home tomorrow if remains stable  Subjective: Pt off bipap for 24 hours.   Objective: Vitals:   12/26/17 1021 12/26/17 1030 12/26/17 1034 12/26/17 1200  BP:      Pulse: (!) 108 (!) 128 (!) 125   Resp:      Temp:    98.2 F (36.8 C)  TempSrc:      SpO2: 90% (!) 83% 96%   Weight:      Height:        Intake/Output Summary (Last 24 hours) at 12/26/2017 1337 Last data filed at 12/26/2017 1000 Gross per 24 hour  Intake 150 ml  Output 800 ml  Net -650 ml   Filed Weights   12/24/17 0500 12/25/17 0500 12/26/17 0500  Weight: 94.3 kg (207 lb 14.3 oz) 95.4 kg (210 lb 5.1  oz) 94.7 kg (208 lb 12.4 oz)     REVIEW OF SYSTEMS  As per history otherwise all reviewed and reported negative  Exam:  General exam: Awake, alert, no apparent distress, shallow breathing and short sentence speaking. Respiratory system: Tachypneic, slightly improved air movement but remains tight, exp wheezes bilateral.  Cardiovascular system: S1 & S2 heard, tachycardic no JVD, murmurs, gallops, clicks or pedal edema. Gastrointestinal system: Abdomen is nondistended, soft and nontender. Normal bowel sounds heard. Central  nervous system: Alert and oriented. No focal neurological deficits. Extremities: no CCE.  Data Reviewed: Basic Metabolic Panel: Recent Labs  Lab 12/23/17 1318 12/24/17 0446 12/25/17 0410 12/26/17 0415  NA 142 145 138 139  K 5.1 4.6 4.3 4.2  CL 95* 98* 93* 95*  CO2 38* 35* 33* 37*  GLUCOSE 115* 158* 122* 136*  BUN 16 19 24* 27*  CREATININE 0.68 0.80 0.87 0.94  CALCIUM 9.2 9.9 9.5 9.6  MG  --  1.9 1.9 2.1   Liver Function Tests: Recent Labs  Lab 12/23/17 1318 12/24/17 0446 12/25/17 0410 12/26/17 0415  AST 15 24 22  41  ALT 18 17 16* 40  ALKPHOS 80 78 63 63  BILITOT 0.7 0.5 0.4 0.6  PROT 7.1 7.0 6.2* 6.2*  ALBUMIN 2.7* 2.7* 2.6* 2.6*   No results for input(s): LIPASE, AMYLASE in the last 168 hours. No results for input(s): AMMONIA in the last 168 hours. CBC: Recent Labs  Lab 12/23/17 1318 12/24/17 0446 12/25/17 0410 12/26/17 0415  WBC 6.5 6.4 11.3* 8.8  NEUTROABS 5.9 5.9 10.5* 8.3*  HGB 11.9* 12.2* 11.8* 12.7*  HCT 40.7 40.7 38.8* 41.2  MCV 105.7* 102.0* 100.3* 99.0  PLT 252 299 306 280   Cardiac Enzymes: Recent Labs  Lab 12/24/17 0446  TROPONINI <0.03   CBG (last 3)  Recent Labs    12/25/17 1648 12/25/17 2150 12/26/17 0736  GLUCAP 109* 112* 117*   Recent Results (from the past 240 hour(s))  Culture, blood (Routine x 2)     Status: None (Preliminary result)   Collection Time: 12/23/17  1:19 PM  Result Value Ref Range Status   Specimen Description RIGHT ANTECUBITAL  Final   Special Requests   Final    BOTTLES DRAWN AEROBIC AND ANAEROBIC Blood Culture adequate volume   Culture   Final    NO GROWTH 3 DAYS Performed at [facility], [address] [place], [place] [address]    Report Status PENDING  Incomplete  Culture, blood (Routine x 2)     Status: None (Preliminary result)   Collection Time: 12/23/17  1:19 PM  Result Value Ref Range Status   Specimen Description BLOOD LEFT ARM  Final   Special Requests   Final    BOTTLES DRAWN AEROBIC AND  ANAEROBIC Blood Culture adequate volume   Culture   Final    NO GROWTH 3 DAYS Performed at [facility], [address] [place], [place] [address]    Report Status PENDING  Incomplete  MRSA PCR Screening     Status: None   Collection Time: 12/23/17  5:00 PM  Result Value Ref Range Status   MRSA by PCR NEGATIVE NEGATIVE Final    Comment:        The GeneXpert MRSA Assay (FDA approved for NASAL specimens only), is one component of a comprehensive MRSA colonization surveillance program. It is not intended to diagnose MRSA infection nor to guide or monitor treatment for MRSA infections. Performed at [facility], [phone]  [address], [place], [place] [address]      Studies: No results found. Scheduled Meds: . budesonide (PULMICORT) nebulizer solution  0.25 mg Nebulization BID  . diltiazem  180 mg Oral Daily  . furosemide  20 mg Oral QODAY  . guaiFENesin  1,200 mg Oral BID  . insulin aspart  0-20 Units Subcutaneous TID WC  . insulin aspart  0-5 Units Subcutaneous QHS  . ipratropium  0.5 mg Nebulization TID  . levalbuterol  0.63 mg Nebulization TID  . methylPREDNISolone (SOLU-MEDROL) injection  40 mg Intravenous Q6H  . metoprolol succinate  50 mg Oral Daily  . pantoprazole  40 mg Oral Q0600  . pravastatin  40 mg Oral q1800  . sodium chloride flush  3 mL Intravenous Q12H  . tamsulosin  0.4 mg Oral QODAY   Continuous Infusions: . sodium chloride 250 mL (12/23/17 1703)  . levofloxacin (LEVAQUIN) IV Stopped (12/26/17 1100)    Principal Problem:   Acute respiratory failure with hypercapnia (HCC) Active Problems:   Acute respiratory acidosis   Acute exacerbation of chronic obstructive pulmonary disease (COPD) (HCC)   Chronic pain syndrome   Chronic combined systolic and diastolic congestive heart failure (HCC)   Chronic atrial fibrillation (HCC)   COPD mixed type (HCC)   Prediabetes   DNR (do not resuscitate)   Anemia, unspecified   Hyperglycemia   Critical care time spent:  31 minutes  Standley Dakins, MD, FAAFP [facility] Pager [phone]  If 7PM-7AM, please contact night-coverage [email or web address] Password [email or web address] 12/26/2017, 1:37 PM    LOS: 3 days

## 2017-12-26 NOTE — Care Management Note (Addendum)
Case Management Note  Patient Details  Name: Logan West MRN: [id number] Date of Birth: 11-Feb-1934  Subjective/Objective:     Adm with Respiratory failure. From home with wife and daughter. Has RW if needed. Has oxygen with [employer], wears continuously. Has neb machine at home. Has PCP, one of his daughters drives him to appoinments. Reports no issues affording medications except for some inhalers.                Action/Plan: Recommended for HH PT. Agreeable, would like [facility]. [facility] notified and will obtain orders from chart. Patient reports daughter will bring port tank from home for transport.  Expected Discharge Date:   12/27/2017               Expected Discharge Plan:  Home w Home Health Services  In-House Referral:     Discharge planning Services  CM Consult  Post Acute Care Choice:  Home Health Choice offered to:  Patient  DME Arranged:    DME Agency:     HH Arranged:  PT HH Agency:  [facility]  Status of Service:  Completed, signed off  If discussed at Long Length of Stay Meetings, dates discussed:    Additional Comments:  Schyler Counsell, Chrystine OilerSharley Diane, RN 12/26/2017, 3:23 PM

## 2017-12-26 NOTE — Evaluation (Signed)
Physical Therapy Evaluation Patient Details Name: Logan West MRN: [id number] DOB: 1934-08-29 Today's Date: 12/26/2017   History of Present Illness  Kamdon Reisig is an 84yo white male who comes to [facility] on 2/2 weak and confused, diaphoretic, found to be hypercapnic. PMH: COPD on 2L at home, AF, HTN. Atbaseline pt is independent in ADL, AMB mostly household distances without A/E, reports not falls or gait instability.; Reports 2L /min O2 use at home with CPAP at night. Daughter and wife live with him, daughter assists with IADL, groceries,, and meals.   Clinical Impression  Pt admitted with above diagnosis. Pt currently with functional limitations due to the deficits listed below (see "PT Problem List"). RN in room upon entry, gives clearance for evaluation; patient agreeable to participate. No acute distress noted at this time. The pt is alert and oriented x3. Pt received on room air as donned [place] is not attached to wall, however non-rebreather is attached to wall: discussed with RT who reports patient received treatment earlier in morning. Basic mobility trialed on 3.5L as found, but SpO2 drops to 83% and HR increases to 128bpm. Pt recovers SpO2 to 96% seated EOB on 5L/min, but HR remains in 120s. Functional mobility assessment demonstrates mild  strength impairment in trunk and BLE, as reported subjectively from patient. AMB appears slow and cautious, but no gait instability or LOB is demonstrated. Empirically, the patient demonstrates increased risk of recurrent falls AEB gait speed <0.41m/s and forward reach <5". Pt will benefit from skilled PT intervention to increase independence and safety with basic mobility in preparation for discharge to the venue listed below.       Follow Up Recommendations Home health PT    Equipment Recommendations  None recommended by PT    Recommendations for Other Services       Precautions / Restrictions Precautions Precautions: Fall Restrictions Weight Bearing  Restrictions: No      Mobility  Bed Mobility Overal bed mobility: Independent;Needs Assistance Bed Mobility: Supine to Sit;Sit to Supine     Supine to sit: Independent Sit to supine: Independent      Transfers Overall transfer level: Needs assistance Equipment used: None Transfers: Sit to/from Stand Sit to Stand: Supervision            Ambulation/Gait Ambulation/Gait assistance: Min guard Ambulation Distance (Feet): 16 Feet Assistive device: None Gait Pattern/deviations: Decreased step length - left;Decreased step length - right     General Gait Details: less than 2 minutes at bedside, SpO2 drops to 83% on 3.5L/min, and HR increases to 128BPM.   Stairs            Wheelchair Mobility    Modified Rankin (Stroke Patients Only)       Balance Overall balance assessment: Modified Independent                                           Pertinent Vitals/Pain Pain Assessment: No/denies pain    Home Living Family/patient expects to be discharged to:: Private residence Living Arrangements: Spouse/significant other Available Help at Discharge: Family(daughter in [place] and [place]; one lives with the patient) Type of Home: Mobile home Home Access: Stairs to enter Entrance Stairs-Rails: Doctor, general practice of Steps: 4 Home Layout: One level Home Equipment: Walker - 2 wheels Additional Comments: does not use A/E for moblity     Prior Function Level of Independence: Independent  Comments: Mostly household AMB, does not go out much d/t O2 use     Hand Dominance   Dominant Hand: Right    Extremity/Trunk Assessment   Upper Extremity Assessment Upper Extremity Assessment: Defer to OT evaluation(difficulty donning socks n session)    Lower Extremity Assessment Lower Extremity Assessment: Overall WFL for tasks assessed       Communication   Communication: No difficulties  Cognition Arousal/Alertness:  Awake/alert Behavior During Therapy: WFL for tasks assessed/performed Overall Cognitive Status: Within Functional Limits for tasks assessed                                        General Comments      Exercises     Assessment/Plan    PT Assessment Patient needs continued PT services  PT Problem List Decreased activity tolerance;Decreased range of motion;Cardiopulmonary status limiting activity       PT Treatment Interventions DME instruction;Gait training;Stair training;Functional mobility training;Therapeutic activities;Therapeutic exercise;Patient/family education    PT Goals (Current goals can be found in the Care Plan section)  Acute Rehab PT Goals Patient Stated Goal: regain strength, return to home  PT Goal Formulation: With patient Time For Goal Achievement: 01/09/18 Potential to Achieve Goals: Good    Frequency Min 3X/week   Barriers to discharge   O2 sats and HR remain uncontrolled and not at baseline.     Co-evaluation               AM-PAC PT "6 Clicks" Daily Activity  Outcome Measure Difficulty turning over in bed (including adjusting bedclothes, sheets and blankets)?: A Little Difficulty moving from lying on back to sitting on the side of the bed? : A Little Difficulty sitting down on and standing up from a chair with arms (e.g., wheelchair, bedside commode, etc,.)?: A Little Help needed moving to and from a bed to chair (including a wheelchair)?: A Little Help needed walking in hospital room?: A Little Help needed climbing 3-5 steps with a railing? : A Lot 6 Click Score: 17    End of Session Equipment Utilized During Treatment: Gait belt;Oxygen Activity Tolerance: Patient tolerated treatment well Patient left: in bed;with call bell/phone within reach;with bed alarm set Nurse Communication: Mobility status([place] not attached to wall O2 after RT treatment ) PT Visit Diagnosis: Unsteadiness on feet (R26.81);Difficulty in walking, not  elsewhere classified (R26.2)    Time: [id number] PT Time Calculation (min) (ACUTE ONLY): 17 min   Charges:   PT Evaluation $PT Eval Moderate Complexity: 1 Mod PT Treatments $Therapeutic Activity: 8-22 mins   PT G Codes:        11:31 AM, 12/26/17 Rosamaria LintsAllan C Tyne Banta, PT, DPT Physical Therapist - [place] [phone](ASCOM)  [phone] (Office)   Venetta Knee C 12/26/2017, 11:26 AM

## 2017-12-26 NOTE — Care Management Important Message (Signed)
Important Message  Patient Details  Name: Logan StaggersHorace Gellner MRN: [id number] Date of Birth: 17-Nov-1934   Medicare Important Message Given:  Yes    Pacey Willadsen, Chrystine OilerSharley Diane, RN 12/26/2017, 3:27 PM

## 2017-12-27 DIAGNOSIS — I5042 Chronic combined systolic (congestive) and diastolic (congestive) heart failure: Secondary | ICD-10-CM

## 2017-12-27 DIAGNOSIS — G9341 Metabolic encephalopathy: Secondary | ICD-10-CM

## 2017-12-27 DIAGNOSIS — J9622 Acute and chronic respiratory failure with hypercapnia: Secondary | ICD-10-CM

## 2017-12-27 DIAGNOSIS — I482 Chronic atrial fibrillation: Secondary | ICD-10-CM

## 2017-12-27 DIAGNOSIS — J441 Chronic obstructive pulmonary disease with (acute) exacerbation: Principal | ICD-10-CM

## 2017-12-27 LAB — GLUCOSE, CAPILLARY
GLUCOSE-CAPILLARY: 111 mg/dL — AB (ref 65–99)
Glucose-Capillary: 116 mg/dL — ABNORMAL HIGH (ref 65–99)

## 2017-12-27 MED ORDER — ALBUTEROL SULFATE (2.5 MG/3ML) 0.083% IN NEBU: 2.5000 mg | INHALATION_SOLUTION | Freq: Four times a day (QID) | RESPIRATORY_TRACT | 12 refills | Status: AC | PRN

## 2017-12-27 MED ORDER — PANTOPRAZOLE SODIUM 40 MG PO TBEC: 40.0000 mg | DELAYED_RELEASE_TABLET | Freq: Every day | ORAL | 0 refills | Status: DC

## 2017-12-27 MED ORDER — PREDNISONE 10 MG PO TABS: ORAL_TABLET | ORAL | 0 refills | Status: DC

## 2017-12-27 MED ORDER — TIOTROPIUM BROMIDE MONOHYDRATE 18 MCG IN CAPS: 18.0000 ug | ORAL_CAPSULE | Freq: Every day | RESPIRATORY_TRACT | 2 refills | Status: DC

## 2017-12-27 MED ORDER — LEVOFLOXACIN 750 MG PO TABS: 750.0000 mg | ORAL_TABLET | Freq: Every day | ORAL | 0 refills | Status: DC

## 2017-12-27 MED ORDER — BUDESONIDE-FORMOTEROL FUMARATE 160-4.5 MCG/ACT IN AERO: 2.0000 | INHALATION_SPRAY | Freq: Two times a day (BID) | RESPIRATORY_TRACT | 12 refills | Status: DC

## 2017-12-27 NOTE — Progress Notes (Signed)
Patient being discharged home today. Wife at bedside, patient requested nursing staff contact his daughter, Trula OreChristina to notify her of discharge orders. Spoke with his daughter Trula Ore- Christina, states she will come to bring his clothes and will pick up his prescriptions for him from his pharmacy. Wife at bedside, brought his home O2 for discharge home. Pt in stable condition awaiting daughters arrival. Earnstine Regalshley Janyla Biscoe, RN

## 2017-12-27 NOTE — Progress Notes (Signed)
Patient discharged home this afternoon. Discharge instructions reviewed with patient per Vikki PortsValerie, RN. Daughter at bedside. Given copy of AVS, daughter aware his prescriptions have been called in to his pharmacy by MD. She brought his home O2 for discharge. Pt left floor in stable condition via w/c accompanied by nurse tech. Earnstine RegalAshley Leona Alen, RN

## 2017-12-27 NOTE — Discharge Summary (Signed)
Physician Discharge Summary  Logan West WUJ:[id number] DOB: 1934/04/16 DOA: 12/23/2017  PCP: Mechele Claude, MD  Admit date: 12/23/2017 Discharge date: 12/27/2017  Admitted From: Home Disposition: Home  Recommendations for Outpatient Follow-up:  1. Follow up with PCP in 1-2 weeks 2. Please obtain BMP/CBC in one week 3. Repeat chest x-ray in 4 weeks to ensure resolution of pneumonia  Home Health: Home: health PT Equipment/Devices: oxygen at 2 L  Discharge Condition: Stable CODE STATUS: DNR Diet recommendation: Heart Healthy   Brief/Interim Summary: Logan Helmsis a 84 y.o.maleformer smoker with known COPD, chronic atrial fibrillation, hypertension, obesity and other past medical history detailed below presented to the emergency department by EMS when family discovered the patient was expressing weakness and confusion.  Discharge Diagnoses:  Principal Problem:   Acute respiratory failure with hypercapnia (HCC) Active Problems:   Chronic pain syndrome   Chronic combined systolic and diastolic congestive heart failure (HCC)   Chronic atrial fibrillation (HCC)   COPD mixed type (HCC)   Prediabetes   DNR (do not resuscitate)   Acute respiratory acidosis   Acute exacerbation of chronic obstructive pulmonary disease (COPD) (HCC)   Anemia, unspecified   Hyperglycemia  1. Acute on chronic respiratory failure with hypercapnia.  Due to COPD exacerbation.  He is chronically on 2 L of oxygen.  Patient required BiPAP therapy, but has since improved.  He is now back on nasal cannula.  Did not require BiPAP overnight.  Mental status appears to be at baseline. 2. Acute metabolic encephalopathy.  Related to CO2 narcosis.  Improved with BiPAP therapy. 3. COPD exacerbation.  Improved with steroids, bronchodilators and antibiotics.  Will continue nebulizer treatments.  Add long-acting beta agonists and inhaled steroids as well as inhaled anticholinergics to COPD regimen.  Will continue prednisone  taper.  Feels that respiratory status is approaching baseline. 4. Atypical community-acquired pneumonia.  Treated with levofloxacin. 5. Chronic atrial fibrillation.  Had a brief episode of atrial fibrillation with rapid ventricular response that responded to intravenous metoprolol.  He was restarted on home Toprol.  Chads vas score of 4 indicating that he should be on anticoagulation with warfarin.  He was not taking this at home.  He wishes to follow-up with his outpatient cardiologist to discuss this further.  We will leave this medication on his outpatient medication list for now until he follows up with his primary cardiologist.   Discharge Instructions  Discharge Instructions    Diet - low sodium heart healthy   Complete by:  As directed    Increase activity slowly   Complete by:  As directed      Allergies as of 12/27/2017   No Known Allergies     Medication List    TAKE these medications   albuterol (2.5 MG/3ML) 0.083% nebulizer solution Commonly known as:  PROVENTIL Take 3 mLs (2.5 mg total) by nebulization every 6 (six) hours as needed for wheezing or shortness of breath.   aspirin 81 MG EC tablet TAKE 1 TABLET (81 MG TOTAL) BY MOUTH DAILY.   budesonide-formoterol 160-4.5 MCG/ACT inhaler Commonly known as:  SYMBICORT Inhale 2 puffs into the lungs 2 (two) times daily.   diltiazem 180 MG 24 hr capsule Commonly known as:  CARDIZEM CD Take 1 capsule (180 mg total) by mouth daily.   furosemide 20 MG tablet Commonly known as:  LASIX Take 1 tablet (20 mg total) by mouth every other day.   guaiFENesin 600 MG 12 hr tablet Commonly known as:  MUCINEX Take 2  tablets (1,200 mg total) by mouth 2 (two) times daily.   HYDROcodone-acetaminophen 5-325 MG tablet Commonly known as:  NORCO/VICODIN Take 1 tablet by mouth every 6 (six) hours as needed for severe pain.   levofloxacin 750 MG tablet Commonly known as:  LEVAQUIN Take 1 tablet (750 mg total) by mouth daily. Start  taking on:  12/28/2017   LORazepam 1 MG tablet Commonly known as:  ATIVAN Take 1 tablet (1 mg total) by mouth every 8 (eight) hours as needed for anxiety.   metoprolol succinate 50 MG 24 hr tablet Commonly known as:  TOPROL-XL Take 1 tablet (50 mg total) by mouth daily. Take with or immediately following a meal.   nitroGLYCERIN 0.4 MG SL tablet Commonly known as:  NITROSTAT Place 1 tablet (0.4 mg total) under the tongue every 5 (five) minutes as needed for chest pain.   pantoprazole 40 MG tablet Commonly known as:  PROTONIX Take 1 tablet (40 mg total) by mouth daily at 6 (six) AM. Start taking on:  12/28/2017   pravastatin 40 MG tablet Commonly known as:  PRAVACHOL TAKE 1 TABLET (40 MG TOTAL) BY MOUTH DAILY.   predniSONE 10 MG tablet Commonly known as:  DELTASONE Take 40mg  po daily for 2 days then 30mg  daily for 2 days then 20mg  daily for 2 days then 10mg  daily for 2 days then stop   tamsulosin 0.4 MG Caps capsule Commonly known as:  FLOMAX Take 1 capsule (0.4 mg total) by mouth daily.   tiotropium 18 MCG inhalation capsule Commonly known as:  SPIRIVA HANDIHALER Place 1 capsule (18 mcg total) into inhaler and inhale daily.   traMADol 50 MG tablet Commonly known as:  ULTRAM TAKE 1 TABLET EVERY 8 HOURS AS NEEDED FOR MODERATE PAIN What changed:    how much to take  how to take this  when to take this  reasons to take this  additional instructions   warfarin 5 MG tablet Commonly known as:  COUMADIN Take 1 tablet (5 mg total) by mouth daily.       No Known Allergies  Consultations:     Procedures/Studies: Dg Chest 2 View  Result Date: 12/23/2017 CLINICAL DATA:  Altered mental status, wheezing, unable to cooperate EXAM: CHEST - 2 VIEW COMPARISON:  01/08/2016 FINDINGS: Course interstitial and patchy airspace opacities in both lung bases, increased since previous exam. Somewhat attenuated bronchovascular markings in the upper lobes. Heart size upper limits  normal. Aortic Atherosclerosis (ICD10-170.0) No definite effusion on these supine radiographs. No pneumothorax evident. Visualized bones unremarkable. IMPRESSION: 1. Slight worsening of bibasilar interstitial and patchy airspace opacities since prior study. Electronically Signed   By: Corlis Leak M.D.   On: 12/23/2017 13:50   Dg Chest Port 1 View  Result Date: 12/24/2017 CLINICAL DATA:  Acute respiratory failure with hypercapnia. EXAM: PORTABLE CHEST 1 VIEW COMPARISON:  12/23/2017 FINDINGS: The cardiac silhouette is borderline enlarged. Aortic atherosclerosis is noted. Patchy airspace and interstitial opacities in both lung bases have not significantly changed. Trace pleural effusions are questioned. No pneumothorax is identified. IMPRESSION: Unchanged patchy bibasilar atelectasis or pneumonia. Possible tiny pleural effusions. Electronically Signed   By: Sebastian Ache M.D.   On: 12/24/2017 10:05    Echo:- Left ventricle: The cavity size was normal. Wall thickness was   normal. Systolic function was normal. The estimated ejection   fraction was in the range of 50% to 55%. Wall motion was normal;   there were no regional wall motion abnormalities. The study is  not technically sufficient to allow evaluation of LV diastolic   function. - Aortic valve: Trileaflet; mildly calcified leaflets. - Left atrium: The atrium was mildly dilated. - Right ventricle: The cavity size was mildly dilated. Systolic   function was mildly reduced. - Right atrium: The atrium was moderately dilated.   Subjective: Feeling better today.  Shortness of breath is improving.  Feels that he is approaching baseline.  Discharge Exam: Vitals:   12/26/17 2059 12/27/17 0759  BP:  (!) 144/68  Pulse:  72  Resp:  18  Temp:  97.7 F (36.5 C)  SpO2: 97% 93%   Vitals:   12/26/17 1900 12/26/17 2000 12/26/17 2059 12/27/17 0759  BP: 133/73   (!) 144/68  Pulse: 80   72  Resp: 19   18  Temp:  98 F (36.7 C)  97.7 F (36.5 C)   TempSrc:  Oral  Oral  SpO2: 96%  97% 93%  Weight:      Height:        General: Pt is alert, awake, not in acute distress Cardiovascular: irregular, S1/S2 +, no rubs, no gallops Respiratory: occasional rhonchi with no wheezing and normal respiratory effort Abdominal: Soft, NT, ND, bowel sounds + Extremities: no edema, no cyanosis    The results of significant diagnostics from this hospitalization (including imaging, microbiology, ancillary and laboratory) are listed below for reference.     Microbiology: Recent Results (from the past 240 hour(s))  Culture, blood (Routine x 2)     Status: None (Preliminary result)   Collection Time: 12/23/17  1:19 PM  Result Value Ref Range Status   Specimen Description RIGHT ANTECUBITAL  Final   Special Requests   Final    BOTTLES DRAWN AEROBIC AND ANAEROBIC Blood Culture adequate volume   Culture   Final    NO GROWTH 4 DAYS Performed at [facility], [address] [place], [place] [address]    Report Status PENDING  Incomplete  Culture, blood (Routine x 2)     Status: None (Preliminary result)   Collection Time: 12/23/17  1:19 PM  Result Value Ref Range Status   Specimen Description BLOOD LEFT ARM  Final   Special Requests   Final    BOTTLES DRAWN AEROBIC AND ANAEROBIC Blood Culture adequate volume   Culture   Final    NO GROWTH 4 DAYS Performed at [facility], [address] [place], [place] [address]    Report Status PENDING  Incomplete  MRSA PCR Screening     Status: None   Collection Time: 12/23/17  5:00 PM  Result Value Ref Range Status   MRSA by PCR NEGATIVE NEGATIVE Final    Comment:        The GeneXpert MRSA Assay (FDA approved for NASAL specimens only), is one component of a comprehensive MRSA colonization surveillance program. It is not intended to diagnose MRSA infection nor to guide or monitor treatment for MRSA infections. Performed at [facility], [address] [place], [place] [address]      Labs: BNP  (last 3 results) No results for input(s): BNP in the last 8760 hours. Basic Metabolic Panel: Recent Labs  Lab 12/23/17 1318 12/24/17 0446 12/25/17 0410 12/26/17 0415  NA 142 145 138 139  K 5.1 4.6 4.3 4.2  CL 95* 98* 93* 95*  CO2 38* 35* 33* 37*  GLUCOSE 115* 158* 122* 136*  BUN 16 19 24* 27*  CREATININE 0.68 0.80 0.87 0.94  CALCIUM 9.2 9.9 9.5 9.6  MG  --  1.9 1.9 2.1   Liver Function Tests: Recent Labs  Lab 12/23/17 1318 12/24/17 0446 12/25/17 0410 12/26/17 0415  AST 15 24 22  41  ALT 18 17 16* 40  ALKPHOS 80 78 63 63  BILITOT 0.7 0.5 0.4 0.6  PROT 7.1 7.0 6.2* 6.2*  ALBUMIN 2.7* 2.7* 2.6* 2.6*   No results for input(s): LIPASE, AMYLASE in the last 168 hours. No results for input(s): AMMONIA in the last 168 hours. CBC: Recent Labs  Lab 12/23/17 1318 12/24/17 0446 12/25/17 0410 12/26/17 0415  WBC 6.5 6.4 11.3* 8.8  NEUTROABS 5.9 5.9 10.5* 8.3*  HGB 11.9* 12.2* 11.8* 12.7*  HCT 40.7 40.7 38.8* 41.2  MCV 105.7* 102.0* 100.3* 99.0  PLT 252 299 306 280   Cardiac Enzymes: Recent Labs  Lab 12/24/17 0446  TROPONINI <0.03   BNP: Invalid input(s): POCBNP CBG: Recent Labs  Lab 12/26/17 1130 12/26/17 1612 12/26/17 2143 12/27/17 0728 12/27/17 1130  GLUCAP 117* 102* 128* 116* 111*   D-Dimer No results for input(s): DDIMER in the last 72 hours. Hgb A1c No results for input(s): HGBA1C in the last 72 hours. Lipid Profile No results for input(s): CHOL, HDL, LDLCALC, TRIG, CHOLHDL, LDLDIRECT in the last 72 hours. Thyroid function studies No results for input(s): TSH, T4TOTAL, T3FREE, THYROIDAB in the last 72 hours.  Invalid input(s): FREET3 Anemia work up No results for input(s): VITAMINB12, FOLATE, FERRITIN, TIBC, IRON, RETICCTPCT in the last 72 hours. Urinalysis    Component Value Date/Time   COLORURINE YELLOW 12/23/2017 1229   APPEARANCEUR CLEAR 12/23/2017 1229   APPEARANCEUR Cloudy (A) 12/26/2016 1431   LABSPEC 1.024 12/23/2017 1229   PHURINE  5.0 12/23/2017 1229   GLUCOSEU NEGATIVE 12/23/2017 1229   HGBUR NEGATIVE 12/23/2017 1229   BILIRUBINUR NEGATIVE 12/23/2017 1229   BILIRUBINUR Negative 12/26/2016 1431   KETONESUR NEGATIVE 12/23/2017 1229   PROTEINUR NEGATIVE 12/23/2017 1229   UROBILINOGEN 1.0 08/29/2015 1527   NITRITE NEGATIVE 12/23/2017 1229   LEUKOCYTESUR NEGATIVE 12/23/2017 1229   LEUKOCYTESUR 2+ (A) 12/26/2016 1431   Sepsis Labs Invalid input(s): PROCALCITONIN,  WBC,  LACTICIDVEN Microbiology Recent Results (from the past 240 hour(s))  Culture, blood (Routine x 2)     Status: None (Preliminary result)   Collection Time: 12/23/17  1:19 PM  Result Value Ref Range Status   Specimen Description RIGHT ANTECUBITAL  Final   Special Requests   Final    BOTTLES DRAWN AEROBIC AND ANAEROBIC Blood Culture adequate volume   Culture   Final    NO GROWTH 4 DAYS Performed at [facility], [address] [place], [place] [address]    Report Status PENDING  Incomplete  Culture, blood (Routine x 2)     Status: None (Preliminary result)   Collection Time: 12/23/17  1:19 PM  Result Value Ref Range Status   Specimen Description BLOOD LEFT ARM  Final   Special Requests   Final    BOTTLES DRAWN AEROBIC AND ANAEROBIC Blood Culture adequate volume   Culture   Final    NO GROWTH 4 DAYS Performed at [facility], [address] [place], [place] [address]    Report Status PENDING  Incomplete  MRSA PCR Screening     Status: None   Collection Time: 12/23/17  5:00 PM  Result Value Ref Range Status   MRSA by PCR NEGATIVE NEGATIVE Final    Comment:        The GeneXpert MRSA Assay (FDA approved for NASAL specimens only),  is one component of a comprehensive MRSA colonization surveillance program. It is not intended to diagnose MRSA infection nor to guide or monitor treatment for MRSA infections. Performed at [facility], [address] [place], [place] [address]      Time coordinating discharge: Over 30  minutes  SIGNED:   Erick Blinks, MD  [facility] 12/27/2017, 11:55 AM Pager   If 7PM-7AM, please contact night-coverage [email or web address] Password [email or web address]

## 2017-12-28 LAB — CULTURE, BLOOD (ROUTINE X 2)
CULTURE: NO GROWTH
CULTURE: NO GROWTH
SPECIAL REQUESTS: ADEQUATE
Special Requests: ADEQUATE

## 2018-01-02 DIAGNOSIS — J449 Chronic obstructive pulmonary disease, unspecified: Secondary | ICD-10-CM | POA: Diagnosis not present

## 2018-01-03 ENCOUNTER — Ambulatory Visit (INDEPENDENT_AMBULATORY_CARE_PROVIDER_SITE_OTHER): Payer: Medicare Other | Admitting: Family Medicine

## 2018-01-03 ENCOUNTER — Ambulatory Visit (INDEPENDENT_AMBULATORY_CARE_PROVIDER_SITE_OTHER): Payer: Medicare Other

## 2018-01-03 ENCOUNTER — Encounter: Payer: Self-pay | Admitting: Family Medicine

## 2018-01-03 VITALS — BP 90/54 | HR 68 | Temp 97.3°F | Ht 68.0 in | Wt 211.5 lb

## 2018-01-03 DIAGNOSIS — I10 Essential (primary) hypertension: Secondary | ICD-10-CM

## 2018-01-03 DIAGNOSIS — J9611 Chronic respiratory failure with hypoxia: Secondary | ICD-10-CM | POA: Diagnosis not present

## 2018-01-03 DIAGNOSIS — I482 Chronic atrial fibrillation, unspecified: Secondary | ICD-10-CM

## 2018-01-03 DIAGNOSIS — I5042 Chronic combined systolic (congestive) and diastolic (congestive) heart failure: Secondary | ICD-10-CM

## 2018-01-03 DIAGNOSIS — J449 Chronic obstructive pulmonary disease, unspecified: Secondary | ICD-10-CM

## 2018-01-03 DIAGNOSIS — I509 Heart failure, unspecified: Secondary | ICD-10-CM | POA: Diagnosis not present

## 2018-01-03 MED ORDER — FLUTICASONE FUROATE-VILANTEROL 200-25 MCG/INH IN AEPB: 1.0000 | INHALATION_SPRAY | Freq: Every day | RESPIRATORY_TRACT | 1 refills | Status: DC

## 2018-01-03 MED ORDER — TAMSULOSIN HCL 0.4 MG PO CAPS: 0.4000 mg | ORAL_CAPSULE | Freq: Every day | ORAL | 1 refills | Status: DC

## 2018-01-03 MED ORDER — FUROSEMIDE 20 MG PO TABS: 20.0000 mg | ORAL_TABLET | ORAL | 1 refills | Status: AC

## 2018-01-03 MED ORDER — PANTOPRAZOLE SODIUM 40 MG PO TBEC: 40.0000 mg | DELAYED_RELEASE_TABLET | Freq: Every day | ORAL | 1 refills | Status: DC

## 2018-01-03 MED ORDER — LORAZEPAM 1 MG PO TABS: 1.0000 mg | ORAL_TABLET | Freq: Three times a day (TID) | ORAL | 5 refills | Status: DC | PRN

## 2018-01-03 MED ORDER — TIOTROPIUM BROMIDE MONOHYDRATE 18 MCG IN CAPS: 18.0000 ug | ORAL_CAPSULE | Freq: Every day | RESPIRATORY_TRACT | 1 refills | Status: DC

## 2018-01-03 MED ORDER — TRAMADOL HCL 50 MG PO TABS: ORAL_TABLET | ORAL | 5 refills | Status: DC

## 2018-01-03 NOTE — Progress Notes (Signed)
Subjective:  Patient ID: Logan West, male    DOB: 02-13-1934  Age: 84 y.o. MRN: [id number]  CC: Hypertension (pt here today for routine follow up of his chronic medical conditions)   HPI Logan West presents for Hospital follow up. Admitted 2/219 and DCed 12/27/17 for acute respiratory failure secondary to COPD, CHF. He now requires 2 L [place] O2. COPD treated with steroids, bronchodilators and antibiotics.  Patient also has a history of atrial fibrillation.  He takes rate control but says that no one ever gave him Coumadin.  After discussion today he tells me he does not want Coumadin.  Specifically, he cannot come in for INR checks regularly.  He says that he cannot afford any of the novel anticoagulants.  He does not want to try them.  He could not afford the Symbicort inhaler that was prescribed at hospital discharge.  His daughter gave him 1 of hers.  He is using albuterol nebulizer treatments.  He just finished his steroid taper today and he took his last levofloxacin tablet this morning.  He denies joint tendinopathy pain.  Patient states he does get short of breath when he takes off his oxygen.  However he is using 2 L nasal cannula and staying very comfortable with that.  He had to have BiPAP in the hospital due to hypercapnia.  That was not recommended at time of discharge  Depression screen Logan West 2/9 01/03/2018 07/03/2017 05/17/2017  Decreased Interest 0 0 0  Down, Depressed, Hopeless 0 0 0  PHQ - 2 Score 0 0 0  Altered sleeping - - -  Tired, decreased energy - - -  Change in appetite - - -  Feeling bad or failure about yourself  - - -  Trouble concentrating - - -  Moving slowly or fidgety/restless - - -  Suicidal thoughts - - -  PHQ-9 Score - - -  Difficult doing work/chores - - -    History Logan West has a past medical history of Atrial fibrillation (Logan West), [name], Chronic pain, COPD (chronic obstructive pulmonary disease) ([name]), Hyperlipidemia, Hypertension,  and Obesity.   He has a past surgical history that includes Percutaneous pinning and Fracture surgery.   His family history includes Asthma in his father; COPD in his daughter; Cancer in his mother; Coronary artery disease in his other; Heart attack in his brother; Thyroid disease in his daughter; Tuberculosis in his father.He reports that he quit smoking about 26 years ago. His smoking use included cigarettes. He has a 62.50 pack-year smoking history. he has never used smokeless tobacco. He reports that he does not drink alcohol or use drugs.    ROS Review of Systems  Constitutional: Negative for chills, diaphoresis, fever and unexpected weight change.  HENT: Negative for congestion, hearing loss, rhinorrhea and sore throat.   Eyes: Negative for visual disturbance.  Respiratory: Positive for cough. Negative for shortness of breath.   Cardiovascular: Positive for palpitations. Negative for chest pain and leg swelling.  Gastrointestinal: Negative for abdominal pain, constipation and diarrhea.  Genitourinary: Negative for dysuria and flank pain.  Musculoskeletal: Negative for arthralgias and joint swelling.  Skin: Negative for rash.  Neurological: Negative for dizziness and headaches.  Psychiatric/Behavioral: Negative for dysphoric mood and sleep disturbance.    Objective:  BP (!) 90/54   Pulse 68   Temp (!) 97.3 F (36.3 C) (Oral)   Ht 5' 8"  (1.727 m)   Wt 211 lb 8 oz (95.9 kg)   BMI  32.16 kg/m   BP Readings from Last 3 Encounters:  01/03/18 (!) 90/54  12/27/17 (!) 144/68  08/09/17 101/68    Wt Readings from Last 3 Encounters:  01/03/18 211 lb 8 oz (95.9 kg)  12/26/17 208 lb 12.4 oz (94.7 kg)  08/09/17 206 lb (93.4 kg)     Physical Exam  Constitutional: He is oriented to person, place, and time. He appears well-developed and well-nourished. No distress.  HENT:  Head: Normocephalic and atraumatic.  Right Ear: External ear normal.  Left Ear: External ear normal.    Nose: Nose normal.  Mouth/Throat: Oropharynx is clear and moist.  Eyes: Conjunctivae and EOM are normal. Pupils are equal, round, and reactive to light.  Neck: Normal range of motion. Neck supple. No thyromegaly present.  Cardiovascular: Normal rate, regular rhythm and normal heart sounds.  No murmur heard. Pulmonary/Chest: Effort normal. No respiratory distress. He has wheezes (breath sounds are distant, scattered rhonchi noted). He has no rales.  Abdominal: Soft. Bowel sounds are normal. He exhibits no distension. There is no tenderness.  Lymphadenopathy:    He has no cervical adenopathy.  Neurological: He is alert and oriented to person, place, and time. He has normal reflexes.  Skin: Skin is warm and dry.  Psychiatric: His behavior is normal. Thought content normal.      Assessment & Plan:   Logan West was seen today for hypertension.  Diagnoses and all orders for this visit:  Chronic combined systolic and diastolic congestive heart failure (HCC) -     CBC with Differential/Platelet -     CMP14+EGFR -     DG Chest 2 View; Future  Essential hypertension, benign -     furosemide (LASIX) 20 MG tablet; Take 1 tablet (20 mg total) by mouth every other day. -     CBC with Differential/Platelet -     CMP14+EGFR  Chronic atrial fibrillation (HCC)  COPD mixed type (HCC)  Chronic respiratory failure with hypoxia (HCC)  Other orders -     pantoprazole (PROTONIX) 40 MG tablet; Take 1 tablet (40 mg total) by mouth daily at 6 (six) AM. -     tamsulosin (FLOMAX) 0.4 MG CAPS capsule; Take 1 capsule (0.4 mg total) by mouth daily. -     tiotropium (SPIRIVA HANDIHALER) 18 MCG inhalation capsule; Place 1 capsule (18 mcg total) into inhaler and inhale daily. -     fluticasone furoate-vilanterol (BREO ELLIPTA) 200-25 MCG/INH AEPB; Inhale 1 puff into the lungs daily. -     LORazepam (ATIVAN) 1 MG tablet; Take 1 tablet (1 mg total) by mouth every 8 (eight) hours as needed for anxiety. -      traMADol (ULTRAM) 50 MG tablet; TAKE 1 TABLET EVERY 8 HOURS AS NEEDED FOR MODERATE PAIN       I have discontinued Logan West's guaiFENesin, HYDROcodone-acetaminophen, warfarin, predniSONE, levofloxacin, and budesonide-formoterol. I am also having him start on fluticasone furoate-vilanterol. Additionally, I am having him maintain his diltiazem, nitroGLYCERIN, pravastatin, metoprolol succinate, aspirin, albuterol, furosemide, pantoprazole, tamsulosin, tiotropium, LORazepam, and traMADol.  Allergies as of 01/03/2018   No Known Allergies     Medication List        Accurate as of 01/03/18  4:03 PM. Always use your most recent med list.          albuterol (2.5 MG/3ML) 0.083% nebulizer solution Commonly known as:  PROVENTIL Take 3 mLs (2.5 mg total) by nebulization every 6 (six) hours as needed for wheezing or shortness of breath.  aspirin 81 MG EC tablet TAKE 1 TABLET (81 MG TOTAL) BY MOUTH DAILY.   diltiazem 180 MG 24 hr capsule Commonly known as:  CARDIZEM CD Take 1 capsule (180 mg total) by mouth daily.   fluticasone furoate-vilanterol 200-25 MCG/INH Aepb Commonly known as:  BREO ELLIPTA Inhale 1 puff into the lungs daily.   furosemide 20 MG tablet Commonly known as:  LASIX Take 1 tablet (20 mg total) by mouth every other day.   LORazepam 1 MG tablet Commonly known as:  ATIVAN Take 1 tablet (1 mg total) by mouth every 8 (eight) hours as needed for anxiety.   metoprolol succinate 50 MG 24 hr tablet Commonly known as:  TOPROL-XL Take 1 tablet (50 mg total) by mouth daily. Take with or immediately following a meal.   nitroGLYCERIN 0.4 MG SL tablet Commonly known as:  NITROSTAT Place 1 tablet (0.4 mg total) under the tongue every 5 (five) minutes as needed for chest pain.   pantoprazole 40 MG tablet Commonly known as:  PROTONIX Take 1 tablet (40 mg total) by mouth daily at 6 (six) AM.   pravastatin 40 MG tablet Commonly known as:  PRAVACHOL TAKE 1 TABLET (40 MG  TOTAL) BY MOUTH DAILY.   tamsulosin 0.4 MG Caps capsule Commonly known as:  FLOMAX Take 1 capsule (0.4 mg total) by mouth daily.   tiotropium 18 MCG inhalation capsule Commonly known as:  SPIRIVA HANDIHALER Place 1 capsule (18 mcg total) into inhaler and inhale daily.   traMADol 50 MG tablet Commonly known as:  ULTRAM TAKE 1 TABLET EVERY 8 HOURS AS NEEDED FOR MODERATE PAIN      I had a detailed discussion with the patient regarding risks been that of anticoagulation for atrial fibrillation in order to prevent thromboembolic events such as stroke.  Patient voiced understanding and declined treatment. Follow-up: Return in about 3 months (around 04/02/2018).  Claretta Fraise, M.D.

## 2018-01-04 LAB — CBC WITH DIFFERENTIAL/PLATELET
BASOS: 0 %
Basophils Absolute: 0 10*3/uL (ref 0.0–0.2)
EOS (ABSOLUTE): 0.1 10*3/uL (ref 0.0–0.4)
Eos: 1 %
Hematocrit: 43.8 % (ref 37.5–51.0)
Hemoglobin: 13.9 g/dL (ref 13.0–17.7)
IMMATURE GRANS (ABS): 0.1 10*3/uL (ref 0.0–0.1)
Immature Granulocytes: 1 %
LYMPHS ABS: 0.6 10*3/uL — AB (ref 0.7–3.1)
LYMPHS: 6 %
MCH: 30.9 pg (ref 26.6–33.0)
MCHC: 31.7 g/dL (ref 31.5–35.7)
MCV: 97 fL (ref 79–97)
MONOS ABS: 0.7 10*3/uL (ref 0.1–0.9)
Monocytes: 7 %
NEUTROS ABS: 8.9 10*3/uL — AB (ref 1.4–7.0)
Neutrophils: 85 %
PLATELETS: 197 10*3/uL (ref 150–379)
RBC: 4.5 x10E6/uL (ref 4.14–5.80)
RDW: 13.6 % (ref 12.3–15.4)
WBC: 10.4 10*3/uL (ref 3.4–10.8)

## 2018-01-04 LAB — CMP14+EGFR
A/G RATIO: 1.5 (ref 1.2–2.2)
ALT: 42 IU/L (ref 0–44)
AST: 25 IU/L (ref 0–40)
Albumin: 3.1 g/dL — ABNORMAL LOW (ref 3.5–4.7)
Alkaline Phosphatase: 66 IU/L (ref 39–117)
BILIRUBIN TOTAL: 0.5 mg/dL (ref 0.0–1.2)
BUN / CREAT RATIO: 19 (ref 10–24)
BUN: 16 mg/dL (ref 8–27)
CHLORIDE: 96 mmol/L (ref 96–106)
CO2: 31 mmol/L — ABNORMAL HIGH (ref 20–29)
Calcium: 8.7 mg/dL (ref 8.6–10.2)
Creatinine, Ser: 0.83 mg/dL (ref 0.76–1.27)
GFR calc non Af Amer: 83 mL/min/{1.73_m2} (ref >59)
GFR, EST AFRICAN AMERICAN: 95 mL/min/{1.73_m2} (ref >59)
Globulin, Total: 2.1 g/dL (ref 1.5–4.5)
Glucose: 81 mg/dL (ref 65–99)
POTASSIUM: 4.3 mmol/L (ref 3.5–5.2)
Sodium: 142 mmol/L (ref 134–144)
TOTAL PROTEIN: 5.2 g/dL — AB (ref 6.0–8.5)

## 2018-01-18 DIAGNOSIS — J449 Chronic obstructive pulmonary disease, unspecified: Secondary | ICD-10-CM | POA: Diagnosis not present

## 2018-01-30 DIAGNOSIS — J449 Chronic obstructive pulmonary disease, unspecified: Secondary | ICD-10-CM | POA: Diagnosis not present

## 2018-02-15 DIAGNOSIS — J449 Chronic obstructive pulmonary disease, unspecified: Secondary | ICD-10-CM | POA: Diagnosis not present

## 2018-03-02 ENCOUNTER — Other Ambulatory Visit: Payer: Self-pay | Admitting: Family Medicine

## 2018-03-02 DIAGNOSIS — J449 Chronic obstructive pulmonary disease, unspecified: Secondary | ICD-10-CM | POA: Diagnosis not present

## 2018-03-02 NOTE — Telephone Encounter (Signed)
Pt notified he has refills at [facility]

## 2018-03-05 ENCOUNTER — Telehealth: Payer: Self-pay | Admitting: Family Medicine

## 2018-03-05 NOTE — Telephone Encounter (Signed)
Aware that it was sent by Dr. Kerry HoughMemon 12/27/17 with 12 refills.

## 2018-03-05 NOTE — Telephone Encounter (Signed)
[email or web address]

## 2018-03-18 DIAGNOSIS — J449 Chronic obstructive pulmonary disease, unspecified: Secondary | ICD-10-CM | POA: Diagnosis not present

## 2018-04-01 DIAGNOSIS — J449 Chronic obstructive pulmonary disease, unspecified: Secondary | ICD-10-CM | POA: Diagnosis not present

## 2018-04-02 ENCOUNTER — Telehealth: Payer: Self-pay | Admitting: Family Medicine

## 2018-04-03 NOTE — Telephone Encounter (Signed)
Aware to take flomax, one every day

## 2018-04-03 NOTE — Telephone Encounter (Signed)
One every day

## 2018-04-04 ENCOUNTER — Ambulatory Visit (INDEPENDENT_AMBULATORY_CARE_PROVIDER_SITE_OTHER): Payer: Medicare Other | Admitting: Family Medicine

## 2018-04-04 ENCOUNTER — Ambulatory Visit: Payer: Medicare Other | Admitting: Family Medicine

## 2018-04-04 ENCOUNTER — Encounter: Payer: Self-pay | Admitting: Family Medicine

## 2018-04-04 VITALS — BP 102/58 | HR 78 | Temp 97.4°F | Ht 68.0 in | Wt 208.2 lb

## 2018-04-04 DIAGNOSIS — I482 Chronic atrial fibrillation, unspecified: Secondary | ICD-10-CM

## 2018-04-04 DIAGNOSIS — J449 Chronic obstructive pulmonary disease, unspecified: Secondary | ICD-10-CM | POA: Diagnosis not present

## 2018-04-04 DIAGNOSIS — R5383 Other fatigue: Secondary | ICD-10-CM | POA: Diagnosis not present

## 2018-04-04 DIAGNOSIS — E782 Mixed hyperlipidemia: Secondary | ICD-10-CM

## 2018-04-04 DIAGNOSIS — I5042 Chronic combined systolic (congestive) and diastolic (congestive) heart failure: Secondary | ICD-10-CM | POA: Diagnosis not present

## 2018-04-04 MED ORDER — TAMSULOSIN HCL 0.4 MG PO CAPS: 0.8000 mg | ORAL_CAPSULE | Freq: Every day | ORAL | 1 refills | Status: AC

## 2018-04-04 MED ORDER — DILTIAZEM HCL ER COATED BEADS 120 MG PO CP24: 120.0000 mg | ORAL_CAPSULE | Freq: Every day | ORAL | 2 refills | Status: DC

## 2018-04-04 NOTE — Progress Notes (Signed)
Subjective:  Patient ID: Logan West, male    DOB: [birth year]  Age: 84 y.o. MRN: [id number]  CC: Medical Management of Chronic Issues   HPI Logan West presents for  follow-up of hypertension. Patient has no history of headache chest pain or  recent cough. Patient also denies symptoms of TIA such as focal numbness or weakness. Patient denies side effects from medication. States taking it regularly. Denies edema.  Wearing oxygen. Dyspnea is stable. Pt. Unsure which meds he is taking. Daughter who is not here with him regulates them for him.  History Logan West has a past medical history of Atrial fibrillation ([place]), CHF (congestive heart failure) ([place]), Chronic pain, COPD (chronic obstructive pulmonary disease) ([place]), Hyperlipidemia, Hypertension, and Obesity.   He has a past surgical history that includes Percutaneous pinning and Fracture surgery.   His family history includes Asthma in his father; COPD in his daughter; Cancer in his mother; Coronary artery disease in his other; Heart attack in his brother; Thyroid disease in his daughter; Tuberculosis in his father.He reports that he quit smoking about 27 years ago. His smoking use included cigarettes. He has a 62.50 pack-year smoking history. He has never used smokeless tobacco. He reports that he does not drink alcohol or use drugs.  Current Outpatient Medications on File Prior to Visit  Medication Sig Dispense Refill  . albuterol (PROVENTIL) (2.5 MG/3ML) 0.083% nebulizer solution Take 3 mLs (2.5 mg total) by nebulization every 6 (six) hours as needed for wheezing or shortness of breath. 75 mL 12  . aspirin 81 MG EC tablet TAKE 1 TABLET (81 MG TOTAL) BY MOUTH DAILY. 30 tablet 1  . furosemide (LASIX) 20 MG tablet Take 1 tablet (20 mg total) by mouth every other day. 45 tablet 1  . LORazepam (ATIVAN) 1 MG tablet Take 1 tablet (1 mg total) by mouth every 8 (eight) hours as needed for anxiety. 60 tablet 5  . metoprolol succinate (TOPROL-XL) 50  MG 24 hr tablet Take 1 tablet (50 mg total) by mouth daily. Take with or immediately following a meal. 90 tablet 3  . pantoprazole (PROTONIX) 40 MG tablet Take 1 tablet (40 mg total) by mouth daily at 6 (six) AM. 90 tablet 1  . pravastatin (PRAVACHOL) 40 MG tablet TAKE 1 TABLET (40 MG TOTAL) BY MOUTH DAILY. 90 tablet 3  . traMADol (ULTRAM) 50 MG tablet TAKE 1 TABLET EVERY 8 HOURS AS NEEDED FOR MODERATE PAIN 60 tablet 5  . fluticasone furoate-vilanterol (BREO ELLIPTA) 200-25 MCG/INH AEPB Inhale 1 puff into the lungs daily. (Patient not taking: Reported on 04/04/2018) 90 each 1  . nitroGLYCERIN (NITROSTAT) 0.4 MG SL tablet Place 1 tablet (0.4 mg total) under the tongue every 5 (five) minutes as needed for chest pain. 25 tablet 3   No current facility-administered medications on file prior to visit.     ROS Review of Systems  Constitutional: Negative.   HENT: Negative.   Eyes: Negative for visual disturbance.  Respiratory: Negative for cough and shortness of breath.   Cardiovascular: Negative for chest pain and leg swelling.  Gastrointestinal: Negative for abdominal pain, diarrhea, nausea and vomiting.  Genitourinary: Negative for difficulty urinating.  Musculoskeletal: Negative for arthralgias and myalgias.  Skin: Negative for rash.  Neurological: Negative for headaches.  Psychiatric/Behavioral: Negative for sleep disturbance.    Objective:  BP (!) 102/58   Pulse 78   Temp (!) 97.4 F (36.3 C) (Oral)   Ht 5' 8"  (1.727 m)   Wt 208 lb  4 oz (94.5 kg)   BMI 31.66 kg/m   BP Readings from Last 3 Encounters:  04/04/18 (!) 102/58  01/03/18 (!) 90/54  12/27/17 (!) 144/68    Wt Readings from Last 3 Encounters:  04/04/18 208 lb 4 oz (94.5 kg)  01/03/18 211 lb 8 oz (95.9 kg)  12/26/17 208 lb 12.4 oz (94.7 kg)     Physical Exam  Constitutional: He is oriented to person, place, and time. He appears well-developed and well-nourished.  HENT:  Head: Normocephalic and atraumatic.    Right Ear: External ear normal.  Left Ear: External ear normal.  Mouth/Throat: No oropharyngeal exudate or posterior oropharyngeal erythema.  Eyes: Pupils are equal, round, and reactive to light.  Neck: Normal range of motion. Neck supple.  Cardiovascular: Normal rate and regular rhythm.  No murmur heard. Pulmonary/Chest: Breath sounds normal. No respiratory distress.  Neurological: He is alert and oriented to person, place, and time.  Vitals reviewed.     Assessment & Plan:   Logan West was seen today for medical management of chronic issues.  Diagnoses and all orders for this visit:  Chronic combined systolic and diastolic congestive heart failure (HCC)  Chronic atrial fibrillation (HCC)  Mixed hyperlipidemia -     CBC with Differential/Platelet -     CMP14+EGFR -     Lipid panel  COPD mixed type (HCC)  Other fatigue -     TSH  Other orders -     diltiazem (CARDIZEM CD) 120 MG 24 hr capsule; Take 1 capsule (120 mg total) by mouth daily. -     tamsulosin (FLOMAX) 0.4 MG CAPS capsule; Take 2 capsules (0.8 mg total) by mouth daily after supper.   Allergies as of 04/04/2018   No Known Allergies     Medication List        Accurate as of 04/04/18 11:01 PM. Always use your most recent med list.          albuterol (2.5 MG/3ML) 0.083% nebulizer solution Commonly known as:  PROVENTIL Take 3 mLs (2.5 mg total) by nebulization every 6 (six) hours as needed for wheezing or shortness of breath.   aspirin 81 MG EC tablet TAKE 1 TABLET (81 MG TOTAL) BY MOUTH DAILY.   diltiazem 120 MG 24 hr capsule Commonly known as:  CARDIZEM CD Take 1 capsule (120 mg total) by mouth daily.   fluticasone furoate-vilanterol 200-25 MCG/INH Aepb Commonly known as:  BREO ELLIPTA Inhale 1 puff into the lungs daily.   furosemide 20 MG tablet Commonly known as:  LASIX Take 1 tablet (20 mg total) by mouth every other day.   LORazepam 1 MG tablet Commonly known as:  ATIVAN Take 1 tablet  (1 mg total) by mouth every 8 (eight) hours as needed for anxiety.   metoprolol succinate 50 MG 24 hr tablet Commonly known as:  TOPROL-XL Take 1 tablet (50 mg total) by mouth daily. Take with or immediately following a meal.   nitroGLYCERIN 0.4 MG SL tablet Commonly known as:  NITROSTAT Place 1 tablet (0.4 mg total) under the tongue every 5 (five) minutes as needed for chest pain.   pantoprazole 40 MG tablet Commonly known as:  PROTONIX Take 1 tablet (40 mg total) by mouth daily at 6 (six) AM.   pravastatin 40 MG tablet Commonly known as:  PRAVACHOL TAKE 1 TABLET (40 MG TOTAL) BY MOUTH DAILY.   tamsulosin 0.4 MG Caps capsule Commonly known as:  FLOMAX Take 2 capsules (0.8 mg total) by  mouth daily after supper.   traMADol 50 MG tablet Commonly known as:  ULTRAM TAKE 1 TABLET EVERY 8 HOURS AS NEEDED FOR MODERATE PAIN       Follow-up: Return in about 3 months (around 07/05/2018).  Claretta Fraise, M.D.

## 2018-04-04 NOTE — Patient Instructions (Signed)
Bring all of your medications in their bottles to your office visits. Heart-Healthy Eating Plan Heart-healthy meal planning includes:  Limiting unhealthy fats.  Increasing healthy fats.  Making other small dietary changes.  You may need to talk with your doctor or a diet specialist (dietitian) to create an eating plan that is right for you. What types of fat should I choose?  Choose healthy fats. These include olive oil and canola oil, flaxseeds, walnuts, almonds, and seeds.  Eat more omega-3 fats. These include salmon, mackerel, sardines, tuna, flaxseed oil, and ground flaxseeds. Try to eat fish at least twice each week.  Limit saturated fats. ? Saturated fats are often found in animal products, such as meats, butter, and cream. ? Plant sources of saturated fats include palm oil, palm kernel oil, and coconut oil.  Avoid foods with partially hydrogenated oils in them. These include stick margarine, some tub margarines, cookies, crackers, and other baked goods. These contain trans fats. What general guidelines do I need to follow?  Check food labels carefully. Identify foods with trans fats or high amounts of saturated fat.  Fill one half of your plate with vegetables and green salads. Eat 4-5 servings of vegetables per day. A serving of vegetables is: ? 1 cup of raw leafy vegetables. ?  cup of raw or cooked cut-up vegetables. ?  cup of vegetable juice.  Fill one fourth of your plate with whole grains. Look for the word "whole" as the first word in the ingredient list.  Fill one fourth of your plate with lean protein foods.  Eat 4-5 servings of fruit per day. A serving of fruit is: ? One medium whole fruit. ?  cup of dried fruit. ?  cup of fresh, frozen, or canned fruit. ?  cup of 100% fruit juice.  Eat more foods that contain soluble fiber. These include apples, broccoli, carrots, beans, peas, and barley. Try to get 20-30 g of fiber per day.  Eat more home-cooked food.  Eat less restaurant, buffet, and fast food.  Limit or avoid alcohol.  Limit foods high in starch and sugar.  Avoid fried foods.  Avoid frying your food. Try baking, boiling, grilling, or broiling it instead. You can also reduce fat by: ? Removing the skin from poultry. ? Removing all visible fats from meats. ? Skimming the fat off of stews, soups, and gravies before serving them. ? Steaming vegetables in water or broth.  Lose weight if you are overweight.  Eat 4-5 servings of nuts, legumes, and seeds per week: ? One serving of dried beans or legumes equals  cup after being cooked. ? One serving of nuts equals 1 ounces. ? One serving of seeds equals  ounce or one tablespoon.  You may need to keep track of how much salt or sodium you eat. This is especially true if you have high blood pressure. Talk with your doctor or dietitian to get more information. What foods can I eat? Grains Breads, including [place], white, pita, wheat, raisin, rye, oatmeal, and [place]. Tortillas that are neither fried nor made with lard or trans fat. Low-fat rolls, including hotdog and hamburger buns and English muffins. Biscuits. Muffins. Waffles. Pancakes. Light popcorn. Whole-grain cereals. Flatbread. Melba toast. Pretzels. Breadsticks. Rusks. Low-fat snacks. Low-fat crackers, including oyster, saltine, matzo, graham, animal, and rye. Rice and pasta, including brown rice and pastas that are made with whole wheat. Vegetables All vegetables. Fruits All fruits, but limit coconut. Meats and Other Protein Sources Lean, well-trimmed beef, veal,  pork, and lamb. Chicken and [place] without skin. All fish and shellfish. Wild duck, rabbit, pheasant, and venison. Egg whites or low-cholesterol egg substitutes. Dried beans, peas, lentils, and tofu. Seeds and most nuts. Dairy Low-fat or nonfat cheeses, including ricotta, string, and mozzarella. Skim or 1% milk that is liquid, powdered, or evaporated. Buttermilk that is  made with low-fat milk. Nonfat or low-fat yogurt. Beverages Mineral water. Diet carbonated beverages. Sweets and Desserts Sherbets and fruit ices. Honey, jam, marmalade, jelly, and syrups. Meringues and gelatins. Pure sugar candy, such as hard candy, jelly beans, gumdrops, mints, marshmallows, and small amounts of dark chocolate. [employer]. Eat all sweets and desserts in moderation. Fats and Oils Nonhydrogenated (trans-free) margarines. Vegetable oils, including soybean, sesame, sunflower, olive, peanut, safflower, corn, canola, and cottonseed. Salad dressings or mayonnaise made with a vegetable oil. Limit added fats and oils that you use for cooking, baking, salads, and as spreads. Other Cocoa powder. Coffee and tea. All seasonings and condiments. The items listed above may not be a complete list of recommended foods or beverages. Contact your dietitian for more options. What foods are not recommended? Grains Breads that are made with saturated or trans fats, oils, or whole milk. Croissants. Butter rolls. Cheese breads. Sweet rolls. Donuts. Buttered popcorn. Chow mein noodles. High-fat crackers, such as cheese or butter crackers. Meats and Other Protein Sources Fatty meats, such as hotdogs, short ribs, sausage, spareribs, bacon, rib eye roast or steak, and mutton. High-fat deli meats, such as salami and bologna. Caviar. Domestic duck and goose. Organ meats, such as kidney, liver, sweetbreads, and heart. Dairy Cream, sour cream, cream cheese, and creamed cottage cheese. Whole-milk cheeses, including blue (bleu), [address], [place], [place], [address], [place], [address], cheddar, [place], and [place]. Whole or 2% milk that is liquid, evaporated, or condensed. Whole buttermilk. Cream sauce or high-fat cheese sauce. Yogurt that is made from whole milk. Beverages Regular sodas and juice drinks with added sugar. Sweets and Desserts Frosting. Pudding. Cookies. Cakes other than angel food cake.  Candy that has milk chocolate or white chocolate, hydrogenated fat, butter, coconut, or unknown ingredients. Buttered syrups. Full-fat ice cream or ice cream drinks. Fats and Oils Gravy that has suet, meat fat, or shortening. Cocoa butter, hydrogenated oils, palm oil, coconut oil, palm kernel oil. These can often be found in baked products, candy, fried foods, nondairy creamers, and whipped toppings. Solid fats and shortenings, including bacon fat, salt pork, lard, and butter. Nondairy cream substitutes, such as coffee creamers and sour cream substitutes. Salad dressings that are made of unknown oils, cheese, or sour cream. The items listed above may not be a complete list of foods and beverages to avoid. Contact your dietitian for more information. This information is not intended to replace advice given to you by your health care provider. Make sure you discuss any questions you have with your health care provider. Document Released: 05/08/2012 Document Revised: 04/14/2016 Document Reviewed: 05/01/2014 Elsevier Interactive Patient Education  [employer].

## 2018-04-05 LAB — CMP14+EGFR
A/G RATIO: 1.4 (ref 1.2–2.2)
ALBUMIN: 3.6 g/dL (ref 3.5–4.7)
ALT: 9 IU/L (ref 0–44)
AST: 10 IU/L (ref 0–40)
Alkaline Phosphatase: 80 IU/L (ref 39–117)
BILIRUBIN TOTAL: 0.4 mg/dL (ref 0.0–1.2)
BUN/Creatinine Ratio: 15 (ref 10–24)
BUN: 15 mg/dL (ref 8–27)
CO2: 34 mmol/L — ABNORMAL HIGH (ref 20–29)
Calcium: 9.8 mg/dL (ref 8.6–10.2)
Chloride: 95 mmol/L — ABNORMAL LOW (ref 96–106)
Creatinine, Ser: 1 mg/dL (ref 0.76–1.27)
GFR calc Af Amer: 81 mL/min/{1.73_m2} (ref >59)
GFR, EST NON AFRICAN AMERICAN: 70 mL/min/{1.73_m2} (ref >59)
Globulin, Total: 2.6 g/dL (ref 1.5–4.5)
Glucose: 95 mg/dL (ref 65–99)
Potassium: 4.7 mmol/L (ref 3.5–5.2)
Sodium: 144 mmol/L (ref 134–144)
TOTAL PROTEIN: 6.2 g/dL (ref 6.0–8.5)

## 2018-04-05 LAB — CBC WITH DIFFERENTIAL/PLATELET
BASOS: 0 %
Basophils Absolute: 0 10*3/uL (ref 0.0–0.2)
EOS (ABSOLUTE): 0.4 10*3/uL (ref 0.0–0.4)
Eos: 6 %
HEMATOCRIT: 42.6 % (ref 37.5–51.0)
HEMOGLOBIN: 14 g/dL (ref 13.0–17.7)
IMMATURE GRANS (ABS): 0 10*3/uL (ref 0.0–0.1)
Immature Granulocytes: 0 %
LYMPHS ABS: 1.4 10*3/uL (ref 0.7–3.1)
LYMPHS: 24 %
MCH: 30.6 pg (ref 26.6–33.0)
MCHC: 32.9 g/dL (ref 31.5–35.7)
MCV: 93 fL (ref 79–97)
MONOCYTES: 9 %
Monocytes Absolute: 0.5 10*3/uL (ref 0.1–0.9)
NEUTROS ABS: 3.6 10*3/uL (ref 1.4–7.0)
Neutrophils: 61 %
Platelets: 160 10*3/uL (ref 150–379)
RBC: 4.57 x10E6/uL (ref 4.14–5.80)
RDW: 13.7 % (ref 12.3–15.4)
WBC: 5.9 10*3/uL (ref 3.4–10.8)

## 2018-04-05 LAB — LIPID PANEL
CHOL/HDL RATIO: 3 ratio (ref 0.0–5.0)
CHOLESTEROL TOTAL: 154 mg/dL (ref 100–199)
HDL: 51 mg/dL (ref >39)
LDL Calculated: 81 mg/dL (ref 0–99)
Triglycerides: 109 mg/dL (ref 0–149)
VLDL CHOLESTEROL CAL: 22 mg/dL (ref 5–40)

## 2018-04-05 LAB — TSH: TSH: 2.39 u[IU]/mL (ref 0.450–4.500)

## 2018-04-17 DIAGNOSIS — J449 Chronic obstructive pulmonary disease, unspecified: Secondary | ICD-10-CM | POA: Diagnosis not present

## 2018-04-25 ENCOUNTER — Other Ambulatory Visit: Payer: Self-pay | Admitting: Family Medicine

## 2018-05-02 DIAGNOSIS — J449 Chronic obstructive pulmonary disease, unspecified: Secondary | ICD-10-CM | POA: Diagnosis not present

## 2018-05-07 ENCOUNTER — Emergency Department (HOSPITAL_COMMUNITY): Payer: Medicare Other

## 2018-05-07 ENCOUNTER — Other Ambulatory Visit: Payer: Self-pay

## 2018-05-07 ENCOUNTER — Emergency Department (HOSPITAL_COMMUNITY)
Admission: EM | Admit: 2018-05-07 | Discharge: 2018-05-07 | Disposition: A | Discharge status: Home / Self Care | Payer: Medicare Other | Attending: Emergency Medicine | Admitting: Emergency Medicine

## 2018-05-07 ENCOUNTER — Ambulatory Visit: Payer: Medicare Other | Admitting: Family Medicine

## 2018-05-07 DIAGNOSIS — Z7982 Long term (current) use of aspirin: Secondary | ICD-10-CM | POA: Insufficient documentation

## 2018-05-07 DIAGNOSIS — R110 Nausea: Secondary | ICD-10-CM | POA: Diagnosis not present

## 2018-05-07 DIAGNOSIS — Z87891 Personal history of nicotine dependence: Secondary | ICD-10-CM | POA: Diagnosis not present

## 2018-05-07 DIAGNOSIS — R531 Weakness: Secondary | ICD-10-CM | POA: Insufficient documentation

## 2018-05-07 DIAGNOSIS — I110 Hypertensive heart disease with heart failure: Secondary | ICD-10-CM | POA: Insufficient documentation

## 2018-05-07 DIAGNOSIS — J449 Chronic obstructive pulmonary disease, unspecified: Secondary | ICD-10-CM | POA: Insufficient documentation

## 2018-05-07 DIAGNOSIS — I5042 Chronic combined systolic (congestive) and diastolic (congestive) heart failure: Secondary | ICD-10-CM | POA: Insufficient documentation

## 2018-05-07 DIAGNOSIS — Z79899 Other long term (current) drug therapy: Secondary | ICD-10-CM | POA: Diagnosis not present

## 2018-05-07 DIAGNOSIS — R0689 Other abnormalities of breathing: Secondary | ICD-10-CM | POA: Diagnosis not present

## 2018-05-07 DIAGNOSIS — E860 Dehydration: Secondary | ICD-10-CM | POA: Diagnosis not present

## 2018-05-07 LAB — COMPREHENSIVE METABOLIC PANEL
ALK PHOS: 62 U/L (ref 38–126)
ALT: 10 U/L — ABNORMAL LOW (ref 17–63)
ANION GAP: 9 (ref 5–15)
AST: 15 U/L (ref 15–41)
Albumin: 3.2 g/dL — ABNORMAL LOW (ref 3.5–5.0)
BUN: 15 mg/dL (ref 6–20)
CALCIUM: 9.3 mg/dL (ref 8.9–10.3)
CO2: 37 mmol/L — AB (ref 22–32)
CREATININE: 0.77 mg/dL (ref 0.61–1.24)
Chloride: 98 mmol/L — ABNORMAL LOW (ref 101–111)
Glucose, Bld: 100 mg/dL — ABNORMAL HIGH (ref 65–99)
Potassium: 5.7 mmol/L — ABNORMAL HIGH (ref 3.5–5.1)
SODIUM: 144 mmol/L (ref 135–145)
Total Bilirubin: 0.7 mg/dL (ref 0.3–1.2)
Total Protein: 6.5 g/dL (ref 6.5–8.1)

## 2018-05-07 LAB — CBC
HCT: 47.9 % (ref 39.0–52.0)
HEMOGLOBIN: 13.9 g/dL (ref 13.0–17.0)
MCH: 30.2 pg (ref 26.0–34.0)
MCHC: 29 g/dL — ABNORMAL LOW (ref 30.0–36.0)
MCV: 103.9 fL — AB (ref 78.0–100.0)
PLATELETS: 122 10*3/uL — AB (ref 150–400)
RBC: 4.61 MIL/uL (ref 4.22–5.81)
RDW: 12.7 % (ref 11.5–15.5)
WBC: 5.1 10*3/uL (ref 4.0–10.5)

## 2018-05-07 LAB — URINALYSIS, ROUTINE W REFLEX MICROSCOPIC
BILIRUBIN URINE: NEGATIVE
GLUCOSE, UA: NEGATIVE mg/dL
HGB URINE DIPSTICK: NEGATIVE
KETONES UR: NEGATIVE mg/dL
Leukocytes, UA: NEGATIVE
Nitrite: NEGATIVE
PROTEIN: NEGATIVE mg/dL
Specific Gravity, Urine: 1.021 (ref 1.005–1.030)
pH: 5 (ref 5.0–8.0)

## 2018-05-07 LAB — MAGNESIUM: MAGNESIUM: 2 mg/dL (ref 1.7–2.4)

## 2018-05-07 LAB — TROPONIN I

## 2018-05-07 LAB — BRAIN NATRIURETIC PEPTIDE: B NATRIURETIC PEPTIDE 5: 169 pg/mL — AB (ref 0.0–100.0)

## 2018-05-07 MED ORDER — ALBUTEROL (5 MG/ML) CONTINUOUS INHALATION SOLN
10.0000 mg/h | INHALATION_SOLUTION | Freq: Once | RESPIRATORY_TRACT | Status: AC
  Administered 2018-05-07: 10 mg/h via RESPIRATORY_TRACT
  Filled 2018-05-07: qty 20

## 2018-05-07 MED ORDER — ONDANSETRON 8 MG PO TBDP: 8.0000 mg | ORAL_TABLET | Freq: Three times a day (TID) | ORAL | 0 refills | Status: DC | PRN

## 2018-05-07 MED ORDER — IPRATROPIUM BROMIDE 0.02 % IN SOLN
0.5000 mg | Freq: Once | RESPIRATORY_TRACT | Status: AC
  Administered 2018-05-07: 0.5 mg via RESPIRATORY_TRACT
  Filled 2018-05-07: qty 2.5

## 2018-05-07 MED ORDER — PREDNISONE 50 MG PO TABS
50.0000 mg | ORAL_TABLET | Freq: Once | ORAL | Status: AC
  Administered 2018-05-07: 50 mg via ORAL
  Filled 2018-05-07: qty 1

## 2018-05-07 MED ORDER — PREDNISONE 50 MG PO TABS: 50.0000 mg | ORAL_TABLET | Freq: Every day | ORAL | 0 refills | Status: DC

## 2018-05-07 MED ORDER — ASPIRIN 81 MG PO CHEW
324.0000 mg | CHEWABLE_TABLET | Freq: Once | ORAL | Status: AC
  Administered 2018-05-07: 324 mg via ORAL
  Filled 2018-05-07: qty 4

## 2018-05-07 NOTE — ED Provider Notes (Addendum)
[facility] Provider Note   CSN: [id number] Arrival date & time: 05/07/18  1306     History   Chief Complaint Chief Complaint  Patient presents with  . Weakness    HPI Logan West is a 84 y.o. male.  HPI Patient presents to the emergency room for evaluation of weakness.  Patient states he is felt nauseated and has not been eating well for the last few days.  Patient also states he is been feeling more short of breath.  He denies any trouble with fevers or coughing.  He is not having any trouble with headache, abdominal pain or chest pain.  Patient states he has been able to get up and use the bathroom on his own but he feels weak and shaky.  He was supposed to see his doctor this afternoon but family members encouraged him to come to the emergency room instead.  Patient does have a history of CHF and COPD.  He is on 2 L of nasal cannula oxygen normally. Past Medical History:  Diagnosis Date  . Atrial fibrillation (HCC)   . CHF (congestive heart failure) (HCC)   . Chronic pain   . COPD (chronic obstructive pulmonary disease) (HCC)   . Hyperlipidemia    x 7-8  . Hypertension    x30 years  . Obesity     Patient Active Problem List   Diagnosis Date Noted  . Acute on chronic respiratory failure with hypercapnia (HCC) 12/23/2017  . DNR (do not resuscitate) 12/23/2017  . Acute respiratory acidosis 12/23/2017  . Acute exacerbation of chronic obstructive pulmonary disease (COPD) (HCC) 12/23/2017  . Anemia, unspecified 12/23/2017  . Hyperglycemia 12/23/2017  . Prediabetes 04/07/2016  . DNR (do not resuscitate) discussion   . Palliative care encounter   . Hypokalemia 11/30/2015  . Respiratory failure (HCC) 11/28/2015  . COPD mixed type (HCC) 09/21/2015  . Hematuria 09/01/2015  . Chronic atrial fibrillation (HCC) 08/29/2015  . Chronic combined systolic and diastolic congestive heart failure (HCC) 08/01/2015  . Chronic pain syndrome 05/28/2013  . HLD  (hyperlipidemia) 05/28/2013  . Essential hypertension 03/16/2011  . Anxiety 03/16/2011  . Obesity 03/16/2011    Past Surgical History:  Procedure Laterality Date  . FRACTURE SURGERY     right forearm  . PERCUTANEOUS PINNING     right forearm fracture        Home Medications    Prior to Admission medications   Medication Sig Start Date End Date Taking? Authorizing Provider  albuterol (PROVENTIL) (2.5 MG/3ML) 0.083% nebulizer solution Take 3 mLs (2.5 mg total) by nebulization every 6 (six) hours as needed for wheezing or shortness of breath. 12/27/17  Yes Erick BlinksMemon, Jehanzeb, MD  aspirin 81 MG EC tablet TAKE 1 TABLET (81 MG TOTAL) BY MOUTH DAILY. 08/25/17  Yes Mechele ClaudeStacks, Warren, MD  diltiazem (CARDIZEM CD) 120 MG 24 hr capsule Take 1 capsule (120 mg total) by mouth daily. 04/04/18  Yes Stacks, Broadus JohnWarren, MD  furosemide (LASIX) 20 MG tablet Take 1 tablet (20 mg total) by mouth every other day. 01/03/18  Yes Stacks, Broadus JohnWarren, MD  LORazepam (ATIVAN) 1 MG tablet Take 1 tablet (1 mg total) by mouth every 8 (eight) hours as needed for anxiety. 01/03/18  Yes Mechele ClaudeStacks, Warren, MD  metoprolol succinate (TOPROL-XL) 50 MG 24 hr tablet Take 1 tablet (50 mg total) by mouth daily. Take with or immediately following a meal. 08/09/17 05/07/18 Yes Hochrein, Fayrene FearingJames, MD  nitroGLYCERIN (NITROSTAT) 0.4 MG SL tablet Place 1 tablet (  0.4 mg total) under the tongue every 5 (five) minutes as needed for chest pain. 08/09/17 05/07/18 Yes Hochrein, Fayrene Fearing, MD  pravastatin (PRAVACHOL) 40 MG tablet TAKE 1 TABLET (40 MG TOTAL) BY MOUTH DAILY. 08/09/17  Yes Rollene Rotunda, MD  tamsulosin (FLOMAX) 0.4 MG CAPS capsule Take 2 capsules (0.8 mg total) by mouth daily after supper. 04/04/18  Yes Stacks, Broadus John, MD  traMADol (ULTRAM) 50 MG tablet TAKE 1 TABLET EVERY 8 HOURS AS NEEDED FOR MODERATE PAIN 01/03/18  Yes Stacks, Broadus John, MD  fluticasone furoate-vilanterol (BREO ELLIPTA) 200-25 MCG/INH AEPB Inhale 1 puff into the lungs daily. Patient not taking:  Reported on 04/04/2018 01/03/18   Mechele Claude, MD  ondansetron (ZOFRAN ODT) 8 MG disintegrating tablet Take 1 tablet (8 mg total) by mouth every 8 (eight) hours as needed for nausea or vomiting. 05/07/18   Linwood Dibbles, MD  pantoprazole (PROTONIX) 40 MG tablet Take 1 tablet (40 mg total) by mouth daily at 6 (six) AM. Patient not taking: Reported on 05/07/2018 01/03/18   Mechele Claude, MD  predniSONE (DELTASONE) 50 MG tablet Take 1 tablet (50 mg total) by mouth daily. 05/07/18   Linwood Dibbles, MD    Family History Family History  Problem Relation Age of Onset  . Cancer Mother   . Asthma Father   . Tuberculosis Father   . Heart attack Brother        same mother.  MI in his 28s  . Coronary artery disease Other   . COPD Daughter   . Thyroid disease Daughter     Social History Social History   Tobacco Use  . Smoking status: Former Smoker    Packs/day: 2.50    Years: 25.00    Pack years: 62.50    Types: Cigarettes    Last attempt to quit: 02/23/1991    Years since quitting: 27.2  . Smokeless tobacco: Never Used  Substance Use Topics  . Alcohol use: No    Alcohol/week: 0.0 oz  . Drug use: No     Allergies   Patient has no known allergies.   Review of Systems Review of Systems  All other systems reviewed and are negative.    Physical Exam Updated Vital Signs BP (!) 152/92 (BP Location: Left Arm)   Pulse 92   Temp 98 F (36.7 C)   Resp 20   Ht 1.727 m (5\' 8" )   Wt 95.3 kg (210 lb)   SpO2 97%   BMI 31.93 kg/m   Physical Exam  Constitutional:  Elderly  HENT:  Head: Normocephalic and atraumatic.  Right Ear: External ear normal.  Left Ear: External ear normal.  Eyes: Conjunctivae are normal. Right eye exhibits no discharge. Left eye exhibits no discharge. No scleral icterus.  Neck: Neck supple. No tracheal deviation present.  Cardiovascular: Normal rate, regular rhythm and intact distal pulses.  Pulmonary/Chest: Effort normal and breath sounds normal. No stridor. No  respiratory distress. He has no wheezes. He has no rales.  No retractions  Abdominal: Soft. Bowel sounds are normal. He exhibits no distension. There is no tenderness. There is no rebound and no guarding.  Musculoskeletal: He exhibits no edema or tenderness.  Neurological: He is alert. No cranial nerve deficit (no facial droop, extraocular movements intact, no slurred speech) or sensory deficit. He exhibits normal muscle tone. He displays no seizure activity. Coordination normal.  Patient is able to move all extremities equally  Skin: Skin is warm and dry. No rash noted. He is not diaphoretic.  Psychiatric: He has a normal mood and affect.  Nursing note and vitals reviewed.    ED Treatments / Results  Labs (all labs ordered are listed, but only abnormal results are displayed) Labs Reviewed  CBC - Abnormal; Notable for the following components:      Result Value   MCV 103.9 (*)    MCHC 29.0 (*)    Platelets 122 (*)    All other components within normal limits  COMPREHENSIVE METABOLIC PANEL - Abnormal; Notable for the following components:   Potassium 5.7 (*)    Chloride 98 (*)    CO2 37 (*)    Glucose, Bld 100 (*)    Albumin 3.2 (*)    ALT 10 (*)    All other components within normal limits  BRAIN NATRIURETIC PEPTIDE - Abnormal; Notable for the following components:   B Natriuretic Peptide 169.0 (*)    All other components within normal limits  TROPONIN I  MAGNESIUM  URINALYSIS, ROUTINE W REFLEX MICROSCOPIC    EKG None Atrial fibrillation with a rate of 81 Right axis deviation Slight ST elevation inferior leads No significant change compared to prior tracing Radiology Dg Chest 2 View  Result Date: 05/07/2018 CLINICAL DATA:  Weakness for 2 days EXAM: CHEST - 2 VIEW COMPARISON:  01/03/2018 FINDINGS: Cardiac shadow is within normal limits. The lungs are hyper aerated bilaterally. Chronic changes in the left base are again seen and stable. No bony abnormality is seen.  IMPRESSION: COPD with chronic changes in the left base. No acute abnormality noted. Electronically Signed   By: Alcide Clever M.D.   On: 05/07/2018 14:40    Procedures Procedures (including critical care time)  Medications Ordered in ED Medications  aspirin chewable tablet 324 mg (324 mg Oral Given 05/07/18 1346)  albuterol (PROVENTIL,VENTOLIN) solution continuous neb (10 mg/hr Nebulization Given 05/07/18 1714)  ipratropium (ATROVENT) nebulizer solution 0.5 mg (0.5 mg Nebulization Given 05/07/18 1714)  predniSONE (DELTASONE) tablet 50 mg (50 mg Oral Given 05/07/18 1744)     Initial Impression / Assessment and Plan / ED Course  I have reviewed the triage vital signs and the nursing notes.  Pertinent labs & imaging results that were available during my care of the patient were reviewed by me and considered in my medical decision making (see chart for details).  Clinical Course as of May 07 2212  Mon May 07, 2018  1637 Labs reviewed.  No anemia.  No signs of UTI.  Troponin and magnesium are normal.  Potassium slightly elevated but no concerning EKG changes.  I wonder if this may be related to hemolysis as his renal function is normal.   [JK]  1702 Pt attempted to walk.  O2 sat dropped in the high 80s. Likely from his copd.  Will try a treatment and reassess   [JK]  1849 Checked on pt.  Offered keeping him in the hospital for his COPD.  Pt states he is feeling better and does not want to be admitted.  Wants to be discharged.   [JK]    Clinical Course User Index [JK] Linwood Dibbles, MD    Patient presented to the emergency room for evaluation of nausea and generalized weakness.  ED work-up is reassuring.  No signs of acute infection.  No signs of pneumonia or congestive heart failure.  Patient's potassium increased but I doubt that this is related to his symptoms.  Renal function.  Patient does have some slight tachypnea but he does have COPD.  No wheezing on my exam.  Doubt COPD  exacerbation.  At this time there does not appear to be any evidence of an acute emergency medical condition and the patient appears stable for discharge with appropriate outpatient follow up.   Final Clinical Impressions(s) / ED Diagnoses   Final diagnoses:  Weakness    ED Discharge Orders        Ordered    ondansetron (ZOFRAN ODT) 8 MG disintegrating tablet  Every 8 hours PRN     05/07/18 1636    predniSONE (DELTASONE) 50 MG tablet  Daily     05/07/18 1851         Linwood Dibbles, MD 05/07/18 Carlis Stable    Linwood Dibbles, MD 05/07/18 2213

## 2018-05-07 NOTE — Discharge Instructions (Signed)
Take the medications as needed for nausea, follow-up with your doctor later this week to be rechecked and to have your potassium level checked,  return to the emergency room for fevers chest pain shortness of breath or other concerning symptoms.

## 2018-05-07 NOTE — ED Notes (Signed)
Ambulated pt on o2.  Pt c/o feeling weak and sob while ambulating.  02 sat decreased to 87% on o2.  EDp aware.  Pt back in bed.

## 2018-05-07 NOTE — ED Triage Notes (Signed)
Patient spouse called EMS, as patient had not been eating or drinking for 3 days.  Patient has history of CHF, COPD on 2LPM oxygen at home.  Alert , oriented x's 3.  Able to ambulate, but is weak and shaky.  CBG by EMS 114.  18gauge in right AC.  Albuterol treatment given by EMS for wheezing.

## 2018-05-08 ENCOUNTER — Telehealth: Payer: Self-pay | Admitting: Cardiology

## 2018-05-08 NOTE — Telephone Encounter (Signed)
Spoke with the patient's daughter in regards to patient's Diltiazem medication.  The patient's PCP recently reduced the amount to 120 mg daily.  The pharmacy was under the impression that the dosage was still at 180 mg per Dr Antoine PocheHochrein 2018.  I told the patient to call his PCP and have them inform pharmacy of the accurate, most updated prescription.  They verbalized understanding.

## 2018-05-08 NOTE — Telephone Encounter (Signed)
New Message    The pharmacist gave him  180mg  of the diltiazem instead of :  diltiazem (CARDIZEM CD) 120 MG 24 hr capsule Take 1 capsule (120 mg total) by mouth daily.   Pharmacist said that Dr Antoine PocheHochrein made the change, please confirm

## 2018-05-09 NOTE — Telephone Encounter (Signed)
Agree 

## 2018-05-11 ENCOUNTER — Other Ambulatory Visit: Payer: Self-pay | Admitting: Family Medicine

## 2018-05-16 ENCOUNTER — Encounter: Payer: Self-pay | Admitting: Family Medicine

## 2018-05-16 ENCOUNTER — Ambulatory Visit (INDEPENDENT_AMBULATORY_CARE_PROVIDER_SITE_OTHER): Payer: Medicare Other | Admitting: Family Medicine

## 2018-05-16 VITALS — BP 107/59 | HR 88 | Temp 97.8°F | Ht 68.0 in | Wt 210.0 lb

## 2018-05-16 DIAGNOSIS — I482 Chronic atrial fibrillation, unspecified: Secondary | ICD-10-CM

## 2018-05-16 DIAGNOSIS — J449 Chronic obstructive pulmonary disease, unspecified: Secondary | ICD-10-CM | POA: Diagnosis not present

## 2018-05-16 MED ORDER — DILTIAZEM HCL ER COATED BEADS 120 MG PO CP24: ORAL_CAPSULE | ORAL | 1 refills | Status: AC

## 2018-05-16 NOTE — Progress Notes (Signed)
Subjective:  Patient ID: Logan West, male    DOB: 1934-01-11  Age: 84 y.o. MRN: [id number]  CC: Hospitalization Follow-up (pt here today after going to [facility] ED for trouble breathing)   HPI Logan West presents for recheck of recent emergency room evaluation for COPD exacerbation.  Went to the emergency room on 17 June because of shortness of breath.  He was given a 4-day course of prednisone and symptoms have resolved.  He said it did cause some bad dreams.  Currently he is short of breath when off the oxygen primarily.  He forgot to turn on his oxygen in the car on the way to the office today and his pulse ox dropped to 86 before he realized what had happened.  He did develop some shortness of breath during that time.  He has had no edema.  No chest pain.  He has not had a cough.  No fever chills or sweats. [facility] 04/04/2018 01/03/2018 07/03/2017  Decreased Interest 0 0 0  Down, Depressed, Hopeless 0 0 0  PHQ - 2 Score 0 0 0  Altered sleeping - - -  Tired, decreased energy - - -  Change in appetite - - -  Feeling bad or failure about yourself  - - -  Trouble concentrating - - -  Moving slowly or fidgety/restless - - -  Suicidal thoughts - - -  PHQ-9 Score - - -  Difficult doing work/chores - - -    History Logan West has a past medical history of Atrial fibrillation (HCC), CHF (congestive heart failure) (HCC), Chronic pain, COPD (chronic obstructive pulmonary disease) (HCC), Hyperlipidemia, Hypertension, and Obesity.   He has a past surgical history that includes Percutaneous pinning and Fracture surgery.   His family history includes Asthma in his father; COPD in his daughter; Cancer in his mother; Coronary artery disease in his other; Heart attack in his brother; Thyroid disease in his daughter; Tuberculosis in his father.He reports that he quit smoking about 27 years ago. His smoking use included cigarettes. He has a 62.50 pack-year smoking history. He has never  used smokeless tobacco. He reports that he does not drink alcohol or use drugs.    ROS Review of Systems  Constitutional: Positive for fatigue. Negative for activity change, chills, diaphoresis and fever.  Respiratory: Positive for shortness of breath.   Cardiovascular: Negative for chest pain.  Musculoskeletal: Negative for arthralgias.  Skin: Negative for rash.  Neurological: Positive for weakness. Negative for syncope, light-headedness and headaches.  Psychiatric/Behavioral: Positive for sleep disturbance. Negative for agitation.    Objective:  BP (!) 107/59   Pulse 88   Temp 97.8 F (36.6 C) (Oral)   Ht 5\' 8"  (1.727 m)   Wt 210 lb (95.3 kg)   SpO2 93% Comment: on 3 liters via [place]  BMI 31.93 kg/m   BP Readings from Last 3 Encounters:  05/16/18 (!) 107/59  05/07/18 (!) 152/92  04/04/18 (!) 102/58    Wt Readings from Last 3 Encounters:  05/16/18 210 lb (95.3 kg)  05/07/18 210 lb (95.3 kg)  04/04/18 208 lb 4 oz (94.5 kg)     Physical Exam  Constitutional: He is oriented to person, place, and time. He appears well-developed and well-nourished. No distress.  HENT:  Head: Normocephalic and atraumatic.  Right Ear: External ear normal.  Left Ear: External ear normal.  Nose: Nose normal.  Mouth/Throat: Oropharynx is clear and moist.  Eyes: Pupils are equal, round, and  reactive to light. Conjunctivae and EOM are normal.  Neck: Normal range of motion. Neck supple. No thyromegaly present.  Cardiovascular: Normal rate, regular rhythm and normal heart sounds.  No murmur heard. Pulmonary/Chest: Effort normal. No respiratory distress. He has wheezes. He has no rales.  Wearing oxygen cannula with 3 L flow.  Breath sounds are distant with decreased expiratory phase  Abdominal: Soft. Bowel sounds are normal. He exhibits no distension. There is no tenderness.  Lymphadenopathy:    He has no cervical adenopathy.  Neurological: He is alert and oriented to person, place, and time.  He has normal reflexes.  Skin: Skin is warm and dry.  Psychiatric: He has a normal mood and affect. His behavior is normal. Judgment and thought content normal.      Assessment & Plan:   Logan West was seen today for hospitalization follow-up.  Diagnoses and all orders for this visit:  COPD mixed type (HCC)  Chronic atrial fibrillation (HCC)  Other orders -     diltiazem (CARDIZEM CD) 120 MG 24 hr capsule; TAKE 1 CAPSULE BY MOUTH EVERY DAY       I have discontinued Logan West's fluticasone furoate-vilanterol and predniSONE. I am also having him maintain his nitroGLYCERIN, pravastatin, metoprolol succinate, aspirin, albuterol, furosemide, pantoprazole, LORazepam, traMADol, tamsulosin, ondansetron, and diltiazem.  Allergies as of 05/16/2018   No Known Allergies     Medication List        Accurate as of 05/16/18 12:06 PM. Always use your most recent med list.          albuterol (2.5 MG/3ML) 0.083% nebulizer solution Commonly known as:  PROVENTIL Take 3 mLs (2.5 mg total) by nebulization every 6 (six) hours as needed for wheezing or shortness of breath.   aspirin 81 MG EC tablet TAKE 1 TABLET (81 MG TOTAL) BY MOUTH DAILY.   diltiazem 120 MG 24 hr capsule Commonly known as:  CARDIZEM CD TAKE 1 CAPSULE BY MOUTH EVERY DAY   furosemide 20 MG tablet Commonly known as:  LASIX Take 1 tablet (20 mg total) by mouth every other day.   LORazepam 1 MG tablet Commonly known as:  ATIVAN Take 1 tablet (1 mg total) by mouth every 8 (eight) hours as needed for anxiety.   metoprolol succinate 50 MG 24 hr tablet Commonly known as:  TOPROL-XL Take 1 tablet (50 mg total) by mouth daily. Take with or immediately following a meal.   nitroGLYCERIN 0.4 MG SL tablet Commonly known as:  NITROSTAT Place 1 tablet (0.4 mg total) under the tongue every 5 (five) minutes as needed for chest pain.   ondansetron 8 MG disintegrating tablet Commonly known as:  ZOFRAN ODT Take 1 tablet (8 mg  total) by mouth every 8 (eight) hours as needed for nausea or vomiting.   pantoprazole 40 MG tablet Commonly known as:  PROTONIX Take 1 tablet (40 mg total) by mouth daily at 6 (six) AM.   pravastatin 40 MG tablet Commonly known as:  PRAVACHOL TAKE 1 TABLET (40 MG TOTAL) BY MOUTH DAILY.   tamsulosin 0.4 MG Caps capsule Commonly known as:  FLOMAX Take 2 capsules (0.8 mg total) by mouth daily after supper.   traMADol 50 MG tablet Commonly known as:  ULTRAM TAKE 1 TABLET EVERY 8 HOURS AS NEEDED FOR MODERATE PAIN        Follow-up: Return in about 6 weeks (around 06/27/2018).  Mechele Claude, M.D.

## 2018-05-16 NOTE — Patient Instructions (Signed)

## 2018-05-18 DIAGNOSIS — J449 Chronic obstructive pulmonary disease, unspecified: Secondary | ICD-10-CM | POA: Diagnosis not present

## 2018-05-21 ENCOUNTER — Ambulatory Visit: Payer: Medicare Other | Admitting: *Deleted

## 2018-05-27 ENCOUNTER — Other Ambulatory Visit: Payer: Self-pay | Admitting: Family Medicine

## 2018-06-01 DIAGNOSIS — J449 Chronic obstructive pulmonary disease, unspecified: Secondary | ICD-10-CM | POA: Diagnosis not present

## 2018-06-05 ENCOUNTER — Ambulatory Visit (INDEPENDENT_AMBULATORY_CARE_PROVIDER_SITE_OTHER): Payer: Medicare Other | Admitting: *Deleted

## 2018-06-05 ENCOUNTER — Encounter: Payer: Self-pay | Admitting: *Deleted

## 2018-06-05 VITALS — BP 115/77 | HR 87 | Ht 68.0 in | Wt 211.0 lb

## 2018-06-05 DIAGNOSIS — Z0000 Encounter for general adult medical examination without abnormal findings: Secondary | ICD-10-CM

## 2018-06-05 NOTE — Patient Instructions (Signed)
Please bring a copy of your Living Will and [place] to our office to be filed in your chart.  Please eat a diet consisting mostly of lean protein, vegetables, fruits and whole grains.   Thank you for coming in for your Annual Wellness Visit today!   Preventive Care 21 Years and Older, Male Preventive care refers to lifestyle choices and visits with your health care provider that can promote health and wellness. What does preventive care include?  A yearly physical exam. This is also called an annual well check.  Dental exams once or twice a year.  Routine eye exams. Ask your health care provider how often you should have your eyes checked.  Personal lifestyle choices, including: ? Daily care of your teeth and gums. ? Regular physical activity. ? Eating a healthy diet. ? Avoiding tobacco and drug use. ? Limiting alcohol use. ? Practicing safe sex. ? Taking low doses of aspirin every day. ? Taking vitamin and mineral supplements as recommended by your health care provider. What happens during an annual well check? The services and screenings done by your health care provider during your annual well check will depend on your age, overall health, lifestyle risk factors, and family history of disease. Counseling Your health care provider may ask you questions about your:  Alcohol use.  Tobacco use.  Drug use.  Emotional well-being.  Home and relationship well-being.  Sexual activity.  Eating habits.  History of falls.  Memory and ability to understand (cognition).  Work and work Statistician.  Screening You may have the following tests or measurements:  Height, weight, and BMI.  Blood pressure.  Lipid and cholesterol levels. These may be checked every 5 years, or more frequently if you are over 67 years old.  Skin check.  Lung cancer screening. You may have this screening every year starting at age 82 if you have a 30-pack-year history of  smoking and currently smoke or have quit within the past 15 years.  Fecal occult blood test (FOBT) of the stool. You may have this test every year starting at age 82.  Flexible sigmoidoscopy or colonoscopy. You may have a sigmoidoscopy every 5 years or a colonoscopy every 10 years starting at age 30.  Prostate cancer screening. Recommendations will vary depending on your family history and other risks.  Hepatitis C blood test.  Hepatitis B blood test.  Sexually transmitted disease (STD) testing.  Diabetes screening. This is done by checking your blood sugar (glucose) after you have not eaten for a while (fasting). You may have this done every 1-3 years.  Abdominal aortic aneurysm (AAA) screening. You may need this if you are a current or former smoker.  Osteoporosis. You may be screened starting at age 43 if you are at high risk.  Talk with your health care provider about your test results, treatment options, and if necessary, the need for more tests. Vaccines Your health care provider may recommend certain vaccines, such as:  Influenza vaccine. This is recommended every year.  Tetanus, diphtheria, and acellular pertussis (Tdap, Td) vaccine. You may need a Td booster every 10 years.  Varicella vaccine. You may need this if you have not been vaccinated.  Zoster vaccine. You may need this after age 9.  Measles, mumps, and rubella (MMR) vaccine. You may need at least one dose of MMR if you were born in 1957 or later. You may also need a second dose.  Pneumococcal 13-valent conjugate (PCV13) vaccine. One dose  is recommended after age 82  Pneumococcal polysaccharide (PPSV23) vaccine. One dose is recommended after age 31.  Meningococcal vaccine. You may need this if you have certain conditions.  Hepatitis A vaccine. You may need this if you have certain conditions or if you travel or work in places where you may be exposed to hepatitis A.  Hepatitis B vaccine. You may need this  if you have certain conditions or if you travel or work in places where you may be exposed to hepatitis B.  Haemophilus influenzae type b (Hib) vaccine. You may need this if you have certain risk factors.  Talk to your health care provider about which screenings and vaccines you need and how often you need them. This information is not intended to replace advice given to you by your health care provider. Make sure you discuss any questions you have with your health care provider. Document Released: 12/04/2015 Document Revised: 07/27/2016 Document Reviewed: 09/08/2015 Elsevier Interactive Patient Education  [employer].

## 2018-06-06 ENCOUNTER — Encounter: Payer: Self-pay | Admitting: *Deleted

## 2018-06-06 NOTE — Progress Notes (Signed)
Subjective:   Logan West is a 84 y.o. male who presents for Medicare Annual/Subsequent preventive examination.  Mr. Dube is accompanied today by his wife and daughter [name].  He is using a portable oxygen tank.  He is retired from [employer] after working there for 33 years.  Mr. Halt enjoys watching racing on television.  He has 1 daughter, 3 step daughters, and 1 adopted daughter and 7 grandchildren. He lives with his wife and one of his daughters who helps with household chores and cooking.  He reports 2 ER visits in the past year.  He was admitted to the hospital after he presented to the ER with respiratory failure in February 2019, and he went to ER a second time to be evaluated for weakness in June 2019.  Mr. Gratz reports no surgeries in the past year.  He feels his health is better this year than last year because he is feeling better in general.   Review of Systems:  Respiratory- shortness of breath       Objective:    Vitals: BP 115/77   Pulse 87   Ht 5\' 8"  (1.727 m)   Wt 211 lb (95.7 kg)   BMI 32.08 kg/m   Body mass index is 32.08 kg/m.  Advanced Directives 06/05/2018 12/23/2017 07/23/2017 05/17/2017 11/29/2015 11/28/2015 08/27/2015  Does Patient Have a Medical Advance Directive? Yes No No Yes No No No  Type of Estate agent of [place];Living will - - Healthcare Power of [place];Living will;Out of facility DNR (pink MOST or yellow form) - - -  Does patient want to make changes to medical advance directive? No - Patient declined - - No - Patient declined - - -  Copy of Healthcare Power of Attorney in Chart? No - copy requested - - No - copy requested - - -  Would patient like information on creating a medical advance directive? - No - Patient declined No - Patient declined - No - patient declined information - No - patient declined information    Tobacco Social History   Tobacco Use  Smoking Status Former Smoker  . Packs/day: 2.50  . Years: 25.00    . Pack years: 62.50  . Types: Cigarettes  . Last attempt to quit: 02/23/1991  . Years since quitting: 27.3  Smokeless Tobacco Never Used     Counseling given: No   Clinical Intake:     Pain Score: 0-No pain                 Past Medical History:  Diagnosis Date  . Atrial fibrillation (HCC)   . CHF (congestive heart failure) (HCC)   . Chronic pain   . COPD (chronic obstructive pulmonary disease) (HCC)   . Hyperlipidemia    x 7-8  . Hypertension    x30 years  . Obesity    Past Surgical History:  Procedure Laterality Date  . FRACTURE SURGERY     right forearm  . PERCUTANEOUS PINNING     right forearm fracture   Family History  Problem Relation Age of Onset  . Cancer Mother   . Asthma Father   . Tuberculosis Father   . Heart attack Brother        same mother.  MI in his 62s  . Coronary artery disease Other   . COPD Daughter   . Thyroid disease Daughter    Social History   Socioeconomic History  . Marital status: Single    Spouse  name: Not on file  . Number of children: Not on file  . Years of education: Not on file  . Highest education level: Not on file  Occupational History  . Not on file  Social Needs  . Financial resource strain: Not on file  . Food insecurity:    Worry: Not on file    Inability: Not on file  . Transportation needs:    Medical: Not on file    Non-medical: Not on file  Tobacco Use  . Smoking status: Former Smoker    Packs/day: 2.50    Years: 25.00    Pack years: 62.50    Types: Cigarettes    Last attempt to quit: 02/23/1991    Years since quitting: 27.3  . Smokeless tobacco: Never Used  Substance and Sexual Activity  . Alcohol use: No    Alcohol/week: 0.0 oz  . Drug use: No  . Sexual activity: Never  Lifestyle  . Physical activity:    Days per week: Not on file    Minutes per session: Not on file  . Stress: Not on file  Relationships  . Social connections:    Talks on phone: Not on file    Gets together: Not  on file    Attends religious service: Not on file    Active member of club or organization: Not on file    Attends meetings of clubs or organizations: Not on file    Relationship status: Not on file  Other Topics Concern  . Not on file  Social History Narrative  . Not on file    Outpatient Encounter Medications as of 06/05/2018  Medication Sig  . albuterol (PROVENTIL) (2.5 MG/3ML) 0.083% nebulizer solution Take 3 mLs (2.5 mg total) by nebulization every 6 (six) hours as needed for wheezing or shortness of breath.  . [facility] ASPIRIN LOW STRENGTH 81 MG EC tablet TAKE 1 TABLET BY MOUTH EVERY DAY  . diltiazem (CARDIZEM CD) 120 MG 24 hr capsule TAKE 1 CAPSULE BY MOUTH EVERY DAY  . furosemide (LASIX) 20 MG tablet Take 1 tablet (20 mg total) by mouth every other day.  Marland Kitchen LORazepam (ATIVAN) 1 MG tablet Take 1 tablet (1 mg total) by mouth every 8 (eight) hours as needed for anxiety.  . pravastatin (PRAVACHOL) 40 MG tablet TAKE 1 TABLET (40 MG TOTAL) BY MOUTH DAILY.  . tamsulosin (FLOMAX) 0.4 MG CAPS capsule Take 2 capsules (0.8 mg total) by mouth daily after supper.  . traMADol (ULTRAM) 50 MG tablet TAKE 1 TABLET EVERY 8 HOURS AS NEEDED FOR MODERATE PAIN  . metoprolol succinate (TOPROL-XL) 50 MG 24 hr tablet Take 1 tablet (50 mg total) by mouth daily. Take with or immediately following a meal.  . nitroGLYCERIN (NITROSTAT) 0.4 MG SL tablet Place 1 tablet (0.4 mg total) under the tongue every 5 (five) minutes as needed for chest pain.  Marland Kitchen ondansetron (ZOFRAN ODT) 8 MG disintegrating tablet Take 1 tablet (8 mg total) by mouth every 8 (eight) hours as needed for nausea or vomiting. (Patient not taking: Reported on 06/05/2018)  . pantoprazole (PROTONIX) 40 MG tablet Take 1 tablet (40 mg total) by mouth daily at 6 (six) AM. (Patient not taking: Reported on 06/05/2018)   No facility-administered encounter medications on file as of 06/05/2018.     Activities of Daily Living In your present state of health, do you  have any difficulty performing the following activities: 06/05/2018 12/23/2017  Hearing? Y N  Comment Trouble hearing in both  ears - has had hearing aids in the past, but lost them.  Not interested in seeing audiologist at this time -  Vision? N N  Difficulty concentrating or making decisions? N N  Walking or climbing stairs? Y Y  Comment Due to shortness of breath -  Dressing or bathing? N Y  Doing errands, shopping? Y Y  Comment No longer drives, daughter takes him to appointments -  Preparing Food and eating ? N -  Using the Toilet? N -  In the past six months, have you accidently leaked urine? N -  Do you have problems with loss of bowel control? N -  Managing your Medications? Y -  Comment daughter helps manage medications -  Managing your Finances? Y -  Comment daughter helps with managing finances -  Housekeeping or managing your Housekeeping? Y -  Comment Son in law helps with yard work -  Some recent data might be hidden    Patient Care Team: Mechele Claude, MD as PCP - General (Family Medicine) Rollene Rotunda, MD as Consulting Physician (Cardiology)   Assessment:   This is a routine wellness examination for [place].  Exercise Activities and Dietary recommendations Current Exercise Habits: The patient does not participate in regular exercise at present, Exercise limited by: respiratory conditions(s)  Goals    . DIET - EAT MORE FRUITS AND VEGETABLES     Eat a diet consisting of mostly lean proteins, vegetables, fruits, and whole grains.       Patient states he usually eats 2 meals and 1 boost shake per day.  Usually has grits and eggs for breakfast, meat and vegetables for supper- patient eats a significant amount of frozen/canned food.  His daughter grocery shops for him.   Recommended diet of mostly lean proteins, vegetables, fruits and whole grains.    Fall Risk Fall Risk  06/06/2018 04/04/2018 01/03/2018 07/03/2017 05/17/2017  Falls in the past year? No No No Yes Yes    Comment - - - - tangled in oxygen cord  Number falls in past yr: - - - 2 or more 2 or more  Injury with Fall? - - - No No  Risk for fall due to : - - - History of fall(s);Impaired balance/gait History of fall(s);Impaired balance/gait   Is the patient's home free of loose throw rugs in walkways, pet beds, electrical cords, etc?   yes      Grab bars in the bathroom? yes      Handrails on the stairs?   no stairs in home      Adequate lighting?   yes    Depression Screen PHQ 2/9 Scores 06/06/2018 04/04/2018 01/03/2018 07/03/2017  PHQ - 2 Score 6 0 0 0  PHQ- 9 Score 14 - - -    Cognitive Function MMSE - Mini Mental State Exam 06/06/2018 05/17/2017  Orientation to time 5 5  Orientation to Place 4 5  Registration 3 3  Attention/ Calculation 0 2  Recall 3 1  Language- name 2 objects 2 2  Language- repeat 0 0  Language- follow 3 step command 3 3  Language- read & follow direction 1 1  Write a sentence 1 1  Copy design 0 0  Total score 22 23    recommend follow up on MMSE and depression screening at next visit with Dr. Darlyn Read.    Immunization History  Administered Date(s) Administered  . Influenza, High Dose Seasonal PF 09/13/2017  . Influenza,inj,Quad PF,6+ Mos 09/11/2014, 08/04/2015, 09/27/2016  .  Pneumococcal Polysaccharide-23 12/01/2015, 05/17/2017    Qualifies for Shingles Vaccine? Declined   Screening Tests Health Maintenance  Topic Date Due  . TETANUS/TDAP  04/04/1953  . PNA vac Low Risk Adult (2 of 2 - PCV13) 05/17/2018  . INFLUENZA VACCINE  06/21/2018   Tdap and Prevnar declined today.   Cancer Screenings: Lung: Low Dose CT Chest recommended if Age 65-80 years, 30 pack-year currently smoking OR have quit w/in 15years. Patient does qualify.   Additional Screenings:  Hepatitis C Screening:  Recommend at next visit with Dr. Darlyn ReadStacks      Plan:     Bring a copy of your Living Will and Healthcare Power of Attorney to our office to be filed in your chart. Work on  your goal of eating a diet consisting mostly of lean protein, vegetables, fruits and whole grains.   I have personally reviewed and noted the following in the patient's chart:   . Medical and social history . Use of alcohol, tobacco or illicit drugs  . Current medications and supplements . Functional ability and status . Nutritional status . Physical activity . Advanced directives . List of other physicians . Hospitalizations, surgeries, and ER visits in previous 12 months . Vitals . Screenings to include cognitive, depression, and falls . Referrals and appointments  In addition, I have reviewed and discussed with patient certain preventive protocols, quality metrics, and best practice recommendations. A written personalized care plan for preventive services as well as general preventive health recommendations were provided to patient.     Mechele ClaudeWarren Stacks, MD  06/06/2018  I have reviewed and agree with the above AWV documentation.  Mechele ClaudeWarren Stacks, M.D.

## 2018-06-17 DIAGNOSIS — J449 Chronic obstructive pulmonary disease, unspecified: Secondary | ICD-10-CM | POA: Diagnosis not present

## 2018-06-19 ENCOUNTER — Other Ambulatory Visit: Payer: Self-pay | Admitting: Family Medicine

## 2018-06-22 ENCOUNTER — Other Ambulatory Visit: Payer: Self-pay | Admitting: Family Medicine

## 2018-06-26 ENCOUNTER — Ambulatory Visit: Payer: Medicare Other | Admitting: Family Medicine

## 2018-06-27 ENCOUNTER — Ambulatory Visit: Payer: Self-pay | Admitting: Cardiology

## 2018-06-28 NOTE — Progress Notes (Signed)
Cardiology Office Note   Date:  06/30/2018   ID:  Logan StaggersHorace West, DOB 06/13/1934, MRN [id number]  PCP:  Mechele ClaudeStacks, Warren, MD  Cardiologist:   Rollene RotundaJames Alexandrina Fiorini, MD   Chief Complaint  Patient presents with  . Atrial Fibrillation     History of Present Illness: Logan West is a 84 y.o. male who presents for follow up of atrial fibrillation.  Since I last saw him he was in the ED with COPD flare and respiratory failure in Feb of this year.  Since then he has had no acute cardiac complaints.  He would not notice he was in atrial fibrillation.  He is on chronic oxygen and gets around slowly for this reason.  He had one episode of chest discomfort that was sharp and stabbing couple of weeks ago and he took a nitroglycerin.  This is only the second nitroglycerin taken in many months.  He is not describing PND or orthopnea.  He is not describing palpitations, presyncope or syncope.  He is frail and takes care of the wife who has apparently more medical problems.   Past Medical History:  Diagnosis Date  . Atrial fibrillation (HCC)   . CHF (congestive heart failure) (HCC)   . Chronic pain   . COPD (chronic obstructive pulmonary disease) (HCC)   . Hyperlipidemia    x 7-8  . Hypertension    x30 years  . Obesity     Past Surgical History:  Procedure Laterality Date  . FRACTURE SURGERY     right forearm  . PERCUTANEOUS PINNING     right forearm fracture     Current Outpatient Medications  Medication Sig Dispense Refill  . albuterol (PROVENTIL) (2.5 MG/3ML) 0.083% nebulizer solution Take 3 mLs (2.5 mg total) by nebulization every 6 (six) hours as needed for wheezing or shortness of breath. 75 mL 12  . CVS ASPIRIN LOW STRENGTH 81 MG EC tablet TAKE 1 TABLET BY MOUTH EVERY DAY 30 tablet 2  . diltiazem (CARDIZEM CD) 120 MG 24 hr capsule TAKE 1 CAPSULE BY MOUTH EVERY DAY 90 capsule 1  . furosemide (LASIX) 20 MG tablet Take 1 tablet (20 mg total) by mouth every other day. 45 tablet 1  .  LORazepam (ATIVAN) 1 MG tablet Take 1 tablet (1 mg total) by mouth every 8 (eight) hours as needed for anxiety. 60 tablet 5  . metoprolol succinate (TOPROL-XL) 50 MG 24 hr tablet Take 1 tablet (50 mg total) by mouth daily. Take with or immediately following a meal. 90 tablet 3  . nitroGLYCERIN (NITROSTAT) 0.4 MG SL tablet Place 1 tablet (0.4 mg total) under the tongue every 5 (five) minutes as needed for chest pain. 25 tablet 3  . pravastatin (PRAVACHOL) 40 MG tablet TAKE 1 TABLET (40 MG TOTAL) BY MOUTH DAILY. 90 tablet 3  . tamsulosin (FLOMAX) 0.4 MG CAPS capsule Take 2 capsules (0.8 mg total) by mouth daily after supper. 180 capsule 1   No current facility-administered medications for this visit.     Allergies:   Patient has no known allergies.    ROS:  Please see the history of present illness.   Otherwise, review of systems are positive for none.   All other systems are reviewed and negative.    PHYSICAL EXAM: VS:  BP 100/60   Pulse 85   Ht 5\' 8"  (1.727 m)   Wt 212 lb (96.2 kg)   BMI 32.23 kg/m  , BMI Body mass index is 32.23 kg/m.  GENERAL:  Frail appearing NECK:  No jugular venous distention, waveform within normal limits, carotid upstroke brisk and symmetric, no bruits, no thyromegaly LUNGS:  Decreased breath sounds CHEST:  Unremarkable HEART:  PMI not displaced or sustained,S1 and S2 within normal limits, no S3, no clicks, no rubs, no murmurs, irregular  ABD:  Flat, positive bowel sounds normal in frequency in pitch, no bruits, no rebound, no guarding, no midline pulsatile mass, no hepatomegaly, no splenomegaly EXT:  2 plus pulses throughout, no edema, no cyanosis no clubbing     EKG:  EKG is ordered today. Atrial fibrillation, rate 85, axis within normal limits, intervals within normal limits, low voltage in the limb leads, no acute ST-T wave changes.   Recent Labs: 04/04/2018: TSH 2.390 05/07/2018: ALT 10; B Natriuretic Peptide 169.0; BUN 15; Creatinine, Ser 0.77;  Hemoglobin 13.9; Magnesium 2.0; Platelets 122; Potassium 5.7; Sodium 144    Lipid Panel    Component Value Date/Time   CHOL 154 04/04/2018 1451   CHOL 142 05/28/2013 1050   TRIG 109 04/04/2018 1451   TRIG 93 05/28/2013 1050   HDL 51 04/04/2018 1451   HDL 38 (L) 05/28/2013 1050   CHOLHDL 3.0 04/04/2018 1451   LDLCALC 81 04/04/2018 1451   LDLCALC 85 05/28/2013 1050      Wt Readings from Last 3 Encounters:  06/29/18 212 lb (96.2 kg)  06/05/18 211 lb (95.7 kg)  05/16/18 210 lb (95.3 kg)      Other studies Reviewed: Additional studies/ records that were reviewed today include: Hospital records Review of the above records demonstrates:  None   ASSESSMENT AND PLAN:   ATRIAL FIB:     Mr. [name] has a CHA2DS2 - VASc score of 4 with a risk of stroke of 4%.  I had a discussion with the patient again and his son-in-law was present as well.  We will try to get him on Coumadin but he cannot make it to any of the appointments to have this checked.  He has very limited transportation.  He could not afford Eliquis in the past.  I will contact the pharmacist and see if they have any means of getting him this for free but he otherwise says he would not be able to try any anticoagulation.  For now he will continue on an adult aspirin 81 mg and he and his family understand the risk of stroke.  He is had good rate control.  CM: He is had a mildly reduced ejection fraction.  However, no change in therapy is indicated.  No further imaging is planned.   CHEST PAIN:   He did have one episode of chest discomfort and needed a nitroglycerin.  He does not want further invasive evaluation or work-up in the past.  We will manage this medically.  Let me know if he has any increasing symptoms.    Current medicines are reviewed at length with the patient today.  The patient does not have concerns regarding medicines.  The following changes have been made:  None  Labs/ tests ordered today include:    None    Disposition:   FU with me in as needed.   Signed, Rollene Rotunda, MD  06/30/2018 12:09 PM    [facility]

## 2018-06-29 ENCOUNTER — Encounter: Payer: Self-pay | Admitting: Cardiology

## 2018-06-29 ENCOUNTER — Ambulatory Visit: Payer: Self-pay | Admitting: Cardiology

## 2018-06-29 ENCOUNTER — Ambulatory Visit: Payer: Medicare Other | Admitting: Cardiology

## 2018-06-29 VITALS — BP 100/60 | HR 85 | Ht 68.0 in | Wt 212.0 lb

## 2018-06-29 DIAGNOSIS — I482 Chronic atrial fibrillation: Secondary | ICD-10-CM | POA: Diagnosis not present

## 2018-06-29 DIAGNOSIS — I10 Essential (primary) hypertension: Secondary | ICD-10-CM

## 2018-06-29 DIAGNOSIS — R072 Precordial pain: Secondary | ICD-10-CM

## 2018-06-29 DIAGNOSIS — I4821 Permanent atrial fibrillation: Secondary | ICD-10-CM

## 2018-06-29 NOTE — Patient Instructions (Addendum)
Medication Instructions:  The current medical regimen is effective;  continue present plan and medications.  Follow-Up: Follow up as needed with Dr Hochrein.  Thank you for choosing [facility]!!     

## 2018-06-30 ENCOUNTER — Encounter: Payer: Self-pay | Admitting: Cardiology

## 2018-07-02 DIAGNOSIS — J449 Chronic obstructive pulmonary disease, unspecified: Secondary | ICD-10-CM | POA: Diagnosis not present

## 2018-07-03 ENCOUNTER — Ambulatory Visit (INDEPENDENT_AMBULATORY_CARE_PROVIDER_SITE_OTHER): Payer: Medicare Other

## 2018-07-03 ENCOUNTER — Ambulatory Visit (INDEPENDENT_AMBULATORY_CARE_PROVIDER_SITE_OTHER): Payer: Medicare Other | Admitting: Family Medicine

## 2018-07-03 ENCOUNTER — Encounter: Payer: Self-pay | Admitting: Family Medicine

## 2018-07-03 VITALS — BP 75/50 | HR 80 | Temp 96.8°F | Ht 68.0 in | Wt 212.5 lb

## 2018-07-03 DIAGNOSIS — I5042 Chronic combined systolic (congestive) and diastolic (congestive) heart failure: Secondary | ICD-10-CM | POA: Diagnosis not present

## 2018-07-03 DIAGNOSIS — I10 Essential (primary) hypertension: Secondary | ICD-10-CM

## 2018-07-03 DIAGNOSIS — J449 Chronic obstructive pulmonary disease, unspecified: Secondary | ICD-10-CM

## 2018-07-03 DIAGNOSIS — I482 Chronic atrial fibrillation, unspecified: Secondary | ICD-10-CM

## 2018-07-03 DIAGNOSIS — R7303 Prediabetes: Secondary | ICD-10-CM

## 2018-07-03 DIAGNOSIS — R0600 Dyspnea, unspecified: Secondary | ICD-10-CM | POA: Diagnosis not present

## 2018-07-03 DIAGNOSIS — E782 Mixed hyperlipidemia: Secondary | ICD-10-CM

## 2018-07-03 DIAGNOSIS — I9589 Other hypotension: Secondary | ICD-10-CM | POA: Diagnosis not present

## 2018-07-03 DIAGNOSIS — R0602 Shortness of breath: Secondary | ICD-10-CM | POA: Diagnosis not present

## 2018-07-03 MED ORDER — METOPROLOL SUCCINATE ER 25 MG PO TB24: 25.0000 mg | ORAL_TABLET | Freq: Every day | ORAL | 2 refills | Status: AC

## 2018-07-03 MED ORDER — LORAZEPAM 1 MG PO TABS: 1.0000 mg | ORAL_TABLET | Freq: Three times a day (TID) | ORAL | 2 refills | Status: AC | PRN

## 2018-07-03 MED ORDER — TRAMADOL HCL 50 MG PO TABS: 50.0000 mg | ORAL_TABLET | Freq: Two times a day (BID) | ORAL | 2 refills | Status: AC

## 2018-07-03 NOTE — Patient Instructions (Signed)
Increase your oxygen to 3 liters via Nasal Canula

## 2018-07-03 NOTE — Progress Notes (Signed)
Subjective:  Patient ID: Logan West, male    DOB: 03/05/1934  Age: 84 y.o. MRN: [id number]  CC: Medical Management of Chronic Issues   HPI Logan West presents for follow-up of his chronic CHF and atrial fibrillation.  He is now oxygen dependent for his COPD as well.  He has been taking his medications apparently irregularly and that he is taking the Lasix 2 a day rather than 1 every other day.  He is rather vague about his history with regard to this at some time saying that he is taking the pain furosemide and the brown Lasix perhaps taking multiple doses of both.  However his swelling has been better.  Unfortunately his blood pressures been quite low and he has been feeling faint.  He says that his oxygenation has been in the mid 90's at home although it is in the high 80's here in the office.  While waiting he increased his oxygen to 3 L nasal cannula and that brought it up to 94-95%.  He denies shortness of breath.  He has not had any chest pain.   Follow-up of hypertension. Patient has no history of headache or chest pain recently.  Patient also denies symptoms of TIA such as numbness weakness lateralizing. Patient checks  blood pressure at home and has not had any elevated readings recently.  He denies that it is gone too low at home.  Patient denies side effects from his medication. States taking it regularly.  I am concerned that he may actually be taken the Cardizem twice a day based on state and he may but then retracted.  Patient in for follow-up of atrial fibrillation. Patient denies any recent bouts of chest pain or palpitations. Additionally, patient is taking anticoagulants. Patient denies any recent excessive bleeding episodes including epistaxis, bleeding from the gums, genitalia, rectal bleeding or hematuria. Additionally there has been no excessive bruising.  Depression screen [facility] 2/9 07/03/2018 06/06/2018 04/04/2018  Decreased Interest 1 3 0  Down, Depressed, Hopeless 1 3 0    PHQ - 2 Score 2 6 0  Altered sleeping 0 0 -  Tired, decreased energy 0 2 -  Change in appetite 0 0 -  Feeling bad or failure about yourself  0 2 -  Trouble concentrating 0 1 -  Moving slowly or fidgety/restless 0 3 -  Suicidal thoughts 0 0 -  PHQ-9 Score 2 14 -  Difficult doing work/chores - - -    History Logan West has a past medical history of Atrial fibrillation ([place]), CHF (congestive heart failure) ([place]), Chronic pain, COPD (chronic obstructive pulmonary disease) ([place]), Hyperlipidemia, Hypertension, and Obesity.   He has a past surgical history that includes Percutaneous pinning and Fracture surgery.   His family history includes Asthma in his father; COPD in his daughter; Cancer in his mother; Coronary artery disease in his other; Heart attack in his brother; Thyroid disease in his daughter; Tuberculosis in his father.He reports that he quit smoking about 27 years ago. His smoking use included cigarettes. He has a 62.50 pack-year smoking history. He has never used smokeless tobacco. He reports that he does not drink alcohol or use drugs.    ROS Review of Systems  Constitutional: Negative.   HENT: Negative.   Eyes: Negative for visual disturbance.  Respiratory: Positive for shortness of breath. Negative for cough.   Cardiovascular: Negative for chest pain and leg swelling.  Gastrointestinal: Negative for abdominal pain, diarrhea, nausea and vomiting.  Genitourinary: Negative for difficulty urinating.  Musculoskeletal: Negative for arthralgias and myalgias.  Skin: Negative for rash.  Neurological: Negative for headaches.  Psychiatric/Behavioral: Negative for sleep disturbance.    Objective:  BP (!) 75/50 (BP Location: Right Arm, Patient Position: Sitting, Cuff Size: Normal)   Pulse 80   Temp (!) 96.8 F (36 C) (Oral)   Ht 5' 8"  (1.727 m)   Wt 212 lb 8 oz (96.4 kg)   SpO2 90% Comment: on 2liters via   BMI 32.31 kg/m   BP Readings from Last 3 Encounters:  07/03/18  (!) 75/50  06/29/18 100/60  06/05/18 115/77    Wt Readings from Last 3 Encounters:  07/03/18 212 lb 8 oz (96.4 kg)  06/29/18 212 lb (96.2 kg)  06/05/18 211 lb (95.7 kg)     Physical Exam  Constitutional: He appears well-developed and well-nourished.  HENT:  Head: Normocephalic and atraumatic.  Right Ear: Tympanic membrane and external ear normal. No decreased hearing is noted.  Left Ear: Tympanic membrane and external ear normal. No decreased hearing is noted.  Mouth/Throat: No oropharyngeal exudate or posterior oropharyngeal erythema.  Eyes: Pupils are equal, round, and reactive to light.  Neck: Normal range of motion. Neck supple.  Cardiovascular: Normal rate. An irregularly irregular rhythm present.  Murmur heard. Pulmonary/Chest: Breath sounds normal. No respiratory distress.  Abdominal: Soft. Bowel sounds are normal. He exhibits no mass. There is no tenderness.  Vitals reviewed.     Assessment & Plan:   Logan West was seen today for medical management of chronic issues.  Diagnoses and all orders for this visit:  Hypotension, iatrogenic  Essential hypertension  Mixed hyperlipidemia  Chronic combined systolic and diastolic congestive heart failure (HCC)  Chronic atrial fibrillation (HCC)  COPD mixed type (HCC)  Prediabetes  Dyspnea, unspecified type -     CMP14+EGFR -     Brain natriuretic peptide -     DG Chest 2 View; Future  Other orders -     metoprolol succinate (TOPROL-XL) 25 MG 24 hr tablet; Take 1 tablet (25 mg total) by mouth daily. -     LORazepam (ATIVAN) 1 MG tablet; Take 1 tablet (1 mg total) by mouth every 8 (eight) hours as needed for anxiety. -     traMADol (ULTRAM) 50 MG tablet; Take 1 tablet (50 mg total) by mouth 2 (two) times daily.       I have discontinued Logan West's metoprolol succinate. I have also changed his traMADol. Additionally, I am having him start on metoprolol succinate. Lastly, I am having him maintain his  nitroGLYCERIN, pravastatin, albuterol, furosemide, tamsulosin, diltiazem, CVS ASPIRIN LOW STRENGTH, and LORazepam.  Allergies as of 07/03/2018   No Known Allergies     Medication List        Accurate as of 07/03/18  6:10 PM. Always use your most recent med list.          albuterol (2.5 MG/3ML) 0.083% nebulizer solution Commonly known as:  PROVENTIL Take 3 mLs (2.5 mg total) by nebulization every 6 (six) hours as needed for wheezing or shortness of breath.   CVS ASPIRIN LOW STRENGTH 81 MG EC tablet Generic drug:  aspirin TAKE 1 TABLET BY MOUTH EVERY DAY   diltiazem 120 MG 24 hr capsule Commonly known as:  CARDIZEM CD TAKE 1 CAPSULE BY MOUTH EVERY DAY   furosemide 20 MG tablet Commonly known as:  LASIX Take 1 tablet (20 mg total) by mouth every other day.   LORazepam 1 MG tablet Commonly known as:  ATIVAN Take 1 tablet (1 mg total) by mouth every 8 (eight) hours as needed for anxiety.   metoprolol succinate 25 MG 24 hr tablet Commonly known as:  TOPROL-XL Take 1 tablet (25 mg total) by mouth daily.   nitroGLYCERIN 0.4 MG SL tablet Commonly known as:  NITROSTAT Place 1 tablet (0.4 mg total) under the tongue every 5 (five) minutes as needed for chest pain.   pravastatin 40 MG tablet Commonly known as:  PRAVACHOL TAKE 1 TABLET (40 MG TOTAL) BY MOUTH DAILY.   tamsulosin 0.4 MG Caps capsule Commonly known as:  FLOMAX Take 2 capsules (0.8 mg total) by mouth daily after supper.   traMADol 50 MG tablet Commonly known as:  ULTRAM Take 1 tablet (50 mg total) by mouth 2 (two) times daily.      Pt. Had multiple BP readings ranging from 75/50 to 90/59. He appeared asymptomatic until he got up to leave. He was offerred hospitalization but adamantly refused due to need to care for his ill wife. He subsequently walked to xray (>50 feet) and had a near syncopal episode. Pt.was again counselled to go to hospital for hypotension, but again refused. He was alert and mentally  competent, so inspite of multiple efforts to convince him, he eventually signed out AMA. One hour was spent with pt. Over 1/2 in counseling regarding his various conditions and his risk of death if not hospitalized.  Follow-up: Return in about 1 week (around 07/10/2018) for hypotension, COPD, CHF.  Claretta Fraise, M.D.

## 2018-07-04 LAB — CMP14+EGFR
ALK PHOS: 65 IU/L (ref 39–117)
ALT: 11 IU/L (ref 0–44)
AST: 13 IU/L (ref 0–40)
Albumin/Globulin Ratio: 1.8 (ref 1.2–2.2)
Albumin: 3.9 g/dL (ref 3.5–4.7)
BILIRUBIN TOTAL: 0.7 mg/dL (ref 0.0–1.2)
BUN/Creatinine Ratio: 14 (ref 10–24)
BUN: 13 mg/dL (ref 8–27)
CHLORIDE: 98 mmol/L (ref 96–106)
CO2: 33 mmol/L — AB (ref 20–29)
CREATININE: 0.93 mg/dL (ref 0.76–1.27)
Calcium: 9.6 mg/dL (ref 8.6–10.2)
GFR calc Af Amer: 88 mL/min/{1.73_m2} (ref >59)
GFR calc non Af Amer: 76 mL/min/{1.73_m2} (ref >59)
GLUCOSE: 111 mg/dL — AB (ref 65–99)
Globulin, Total: 2.2 g/dL (ref 1.5–4.5)
Potassium: 5 mmol/L (ref 3.5–5.2)
Sodium: 145 mmol/L — ABNORMAL HIGH (ref 134–144)
Total Protein: 6.1 g/dL (ref 6.0–8.5)

## 2018-07-04 LAB — BRAIN NATRIURETIC PEPTIDE: BNP: 80.8 pg/mL (ref 0.0–100.0)

## 2018-07-10 ENCOUNTER — Encounter: Payer: Self-pay | Admitting: Family Medicine

## 2018-07-10 ENCOUNTER — Ambulatory Visit (INDEPENDENT_AMBULATORY_CARE_PROVIDER_SITE_OTHER): Payer: Medicare Other | Admitting: Family Medicine

## 2018-07-10 VITALS — BP 115/56 | HR 114 | Temp 97.5°F | Ht 68.0 in | Wt 209.5 lb

## 2018-07-10 DIAGNOSIS — I482 Chronic atrial fibrillation, unspecified: Secondary | ICD-10-CM

## 2018-07-10 DIAGNOSIS — J449 Chronic obstructive pulmonary disease, unspecified: Secondary | ICD-10-CM | POA: Diagnosis not present

## 2018-07-10 DIAGNOSIS — I10 Essential (primary) hypertension: Secondary | ICD-10-CM

## 2018-07-10 DIAGNOSIS — I5042 Chronic combined systolic (congestive) and diastolic (congestive) heart failure: Secondary | ICD-10-CM

## 2018-07-12 ENCOUNTER — Encounter: Payer: Self-pay | Admitting: Family Medicine

## 2018-07-12 NOTE — Progress Notes (Signed)
Subjective:  Patient ID: Logan West, male    DOB: 08/03/1934  Age: 84 y.o. MRN: [id number]  CC: Follow-up (pt here today for 1 week follow up)   HPI Logan West presents for recheck of his hypotension and hypoxia. He was offered hospitalization and refused last week because his wife needs him at home. This was in spite of passing out in the ofice last week.He has had a good week he reports. The dizziness resolved. He is using O2 ATC but still getting dyspneic. Off O2 when he came into the office and sat dropped to 64%. Pt. States home )2 on 2 liters was 94. BP much better than last week.    Depression screen [facility] 2/9 07/03/2018 06/06/2018 04/04/2018  Decreased Interest 1 3 0  Down, Depressed, Hopeless 1 3 0  PHQ - 2 Score 2 6 0  Altered sleeping 0 0 -  Tired, decreased energy 0 2 -  Change in appetite 0 0 -  Feeling bad or failure about yourself  0 2 -  Trouble concentrating 0 1 -  Moving slowly or fidgety/restless 0 3 -  Suicidal thoughts 0 0 -  PHQ-9 Score 2 14 -  Difficult doing work/chores - - -    History Logan West has a past medical history of Atrial fibrillation (HCC), CHF (congestive heart failure) (HCC), Chronic pain, COPD (chronic obstructive pulmonary disease) (HCC), Hyperlipidemia, Hypertension, and Obesity.   He has a past surgical history that includes Percutaneous pinning and Fracture surgery.   His family history includes Asthma in his father; COPD in his daughter; Cancer in his mother; Coronary artery disease in his other; Heart attack in his brother; Thyroid disease in his daughter; Tuberculosis in his father.He reports that he quit smoking about 27 years ago. His smoking use included cigarettes. He has a 62.50 pack-year smoking history. He has never used smokeless tobacco. He reports that he does not drink alcohol or use drugs.    ROS Review of Systems  Constitutional: Positive for fatigue. Negative for chills, diaphoresis and fever.  HENT: Negative.   Eyes:  Negative for visual disturbance.  Respiratory: Positive for shortness of breath. Negative for cough.   Cardiovascular: Negative for chest pain and leg swelling.  Gastrointestinal: Negative for abdominal pain, diarrhea, nausea and vomiting.  Genitourinary: Negative for difficulty urinating.  Musculoskeletal: Negative for arthralgias and myalgias.  Skin: Negative for rash.  Neurological: Negative for headaches.  Psychiatric/Behavioral: Negative for sleep disturbance.    Objective:  BP (!) 115/56   Pulse (!) 114   Temp (!) 97.5 F (36.4 C) (Oral)   Ht 5\' 8"  (1.727 m)   Wt 209 lb 8 oz (95 kg)   SpO2 (!) 89%   BMI 31.85 kg/m   BP Readings from Last 3 Encounters:  07/10/18 (!) 115/56  07/03/18 (!) 75/50  06/29/18 100/60    Wt Readings from Last 3 Encounters:  07/10/18 209 lb 8 oz (95 kg)  07/03/18 212 lb 8 oz (96.4 kg)  06/29/18 212 lb (96.2 kg)     Physical Exam  Constitutional: He is oriented to person, place, and time. He appears well-developed and well-nourished.  HENT:  Head: Normocephalic and atraumatic.  Right Ear: External ear normal.  Left Ear: External ear normal.  Mouth/Throat: No oropharyngeal exudate or posterior oropharyngeal erythema.  Eyes: Pupils are equal, round, and reactive to light.  Neck: Normal range of motion. Neck supple.  Cardiovascular: Normal rate and regular rhythm.  No murmur heard. Pulmonary/Chest: No  respiratory distress.  Abdominal: He exhibits no distension. There is no tenderness.  Neurological: He is alert and oriented to person, place, and time.  Skin: Skin is warm and dry.  Psychiatric: He has a normal mood and affect. His behavior is normal.  Vitals reviewed.   On 3liters pt. O2 ranges from 87-92  Assessment & Plan:   Logan West was seen today for follow-up.  Diagnoses and all orders for this visit:  Chronic atrial fibrillation (HCC)  COPD mixed type (HCC)  Chronic combined systolic and diastolic congestive heart failure  (HCC)  Essential hypertension       I am having Logan West maintain his nitroGLYCERIN, pravastatin, albuterol, furosemide, tamsulosin, diltiazem, CVS ASPIRIN LOW STRENGTH, metoprolol succinate, LORazepam, and traMADol.  Allergies as of 07/10/2018   No Known Allergies     Medication List        Accurate as of 07/10/18 11:59 PM. Always use your most recent med list.          albuterol (2.5 MG/3ML) 0.083% nebulizer solution Commonly known as:  PROVENTIL Take 3 mLs (2.5 mg total) by nebulization every 6 (six) hours as needed for wheezing or shortness of breath.   CVS ASPIRIN LOW STRENGTH 81 MG EC tablet Generic drug:  aspirin TAKE 1 TABLET BY MOUTH EVERY DAY   diltiazem 120 MG 24 hr capsule Commonly known as:  CARDIZEM CD TAKE 1 CAPSULE BY MOUTH EVERY DAY   furosemide 20 MG tablet Commonly known as:  LASIX Take 1 tablet (20 mg total) by mouth every other day.   LORazepam 1 MG tablet Commonly known as:  ATIVAN Take 1 tablet (1 mg total) by mouth every 8 (eight) hours as needed for anxiety.   metoprolol succinate 25 MG 24 hr tablet Commonly known as:  TOPROL-XL Take 1 tablet (25 mg total) by mouth daily.   nitroGLYCERIN 0.4 MG SL tablet Commonly known as:  NITROSTAT Place 1 tablet (0.4 mg total) under the tongue every 5 (five) minutes as needed for chest pain.   pravastatin 40 MG tablet Commonly known as:  PRAVACHOL TAKE 1 TABLET (40 MG TOTAL) BY MOUTH DAILY.   tamsulosin 0.4 MG Caps capsule Commonly known as:  FLOMAX Take 2 capsules (0.8 mg total) by mouth daily after supper.   traMADol 50 MG tablet Commonly known as:  ULTRAM Take 1 tablet (50 mg total) by mouth 2 (two) times daily.      Keep O2 at 3 liters. Notify office if dyspnea occurs.  Follow-up: Return in about 2 weeks (around 07/24/2018).  Mechele ClaudeWarren Future [place]ell, M.D.

## 2018-07-18 DIAGNOSIS — J449 Chronic obstructive pulmonary disease, unspecified: Secondary | ICD-10-CM | POA: Diagnosis not present

## 2018-08-01 ENCOUNTER — Ambulatory Visit: Payer: Self-pay | Admitting: Cardiology

## 2018-08-02 DIAGNOSIS — J449 Chronic obstructive pulmonary disease, unspecified: Secondary | ICD-10-CM | POA: Diagnosis not present

## 2018-08-07 ENCOUNTER — Other Ambulatory Visit: Payer: Self-pay

## 2018-08-07 ENCOUNTER — Emergency Department (HOSPITAL_COMMUNITY)
Admission: EM | Admit: 2018-08-07 | Discharge: 2018-08-08 | Disposition: A | Discharge status: Home / Self Care | Payer: Medicare Other | Attending: Emergency Medicine | Admitting: Emergency Medicine

## 2018-08-07 ENCOUNTER — Emergency Department (HOSPITAL_COMMUNITY): Payer: Medicare Other

## 2018-08-07 ENCOUNTER — Encounter (HOSPITAL_COMMUNITY): Payer: Self-pay | Admitting: Emergency Medicine

## 2018-08-07 DIAGNOSIS — R0902 Hypoxemia: Secondary | ICD-10-CM | POA: Diagnosis not present

## 2018-08-07 DIAGNOSIS — S59902A Unspecified injury of left elbow, initial encounter: Secondary | ICD-10-CM | POA: Diagnosis not present

## 2018-08-07 DIAGNOSIS — S52502A Unspecified fracture of the lower end of left radius, initial encounter for closed fracture: Secondary | ICD-10-CM | POA: Insufficient documentation

## 2018-08-07 DIAGNOSIS — R062 Wheezing: Secondary | ICD-10-CM | POA: Diagnosis not present

## 2018-08-07 DIAGNOSIS — Y929 Unspecified place or not applicable: Secondary | ICD-10-CM | POA: Insufficient documentation

## 2018-08-07 DIAGNOSIS — J449 Chronic obstructive pulmonary disease, unspecified: Secondary | ICD-10-CM | POA: Insufficient documentation

## 2018-08-07 DIAGNOSIS — Y93E1 Activity, personal bathing and showering: Secondary | ICD-10-CM | POA: Diagnosis not present

## 2018-08-07 DIAGNOSIS — S62616A Displaced fracture of proximal phalanx of right little finger, initial encounter for closed fracture: Secondary | ICD-10-CM | POA: Diagnosis not present

## 2018-08-07 DIAGNOSIS — S52692A Other fracture of lower end of left ulna, initial encounter for closed fracture: Secondary | ICD-10-CM | POA: Insufficient documentation

## 2018-08-07 DIAGNOSIS — Y999 Unspecified external cause status: Secondary | ICD-10-CM | POA: Insufficient documentation

## 2018-08-07 DIAGNOSIS — I110 Hypertensive heart disease with heart failure: Secondary | ICD-10-CM | POA: Diagnosis not present

## 2018-08-07 DIAGNOSIS — S52572A Other intraarticular fracture of lower end of left radius, initial encounter for closed fracture: Secondary | ICD-10-CM | POA: Diagnosis not present

## 2018-08-07 DIAGNOSIS — W182XXA Fall in (into) shower or empty bathtub, initial encounter: Secondary | ICD-10-CM | POA: Diagnosis not present

## 2018-08-07 DIAGNOSIS — S62646A Nondisplaced fracture of proximal phalanx of right little finger, initial encounter for closed fracture: Secondary | ICD-10-CM | POA: Diagnosis not present

## 2018-08-07 DIAGNOSIS — Z79899 Other long term (current) drug therapy: Secondary | ICD-10-CM | POA: Diagnosis not present

## 2018-08-07 DIAGNOSIS — R0689 Other abnormalities of breathing: Secondary | ICD-10-CM | POA: Diagnosis not present

## 2018-08-07 DIAGNOSIS — I509 Heart failure, unspecified: Secondary | ICD-10-CM | POA: Diagnosis not present

## 2018-08-07 DIAGNOSIS — R000 Tachycardia, unspecified: Secondary | ICD-10-CM | POA: Insufficient documentation

## 2018-08-07 DIAGNOSIS — S6991XA Unspecified injury of right wrist, hand and finger(s), initial encounter: Secondary | ICD-10-CM | POA: Diagnosis not present

## 2018-08-07 DIAGNOSIS — S52592A Other fractures of lower end of left radius, initial encounter for closed fracture: Secondary | ICD-10-CM | POA: Diagnosis not present

## 2018-08-07 DIAGNOSIS — S52612A Displaced fracture of left ulna styloid process, initial encounter for closed fracture: Secondary | ICD-10-CM | POA: Diagnosis not present

## 2018-08-07 DIAGNOSIS — W19XXXA Unspecified fall, initial encounter: Secondary | ICD-10-CM | POA: Diagnosis not present

## 2018-08-07 DIAGNOSIS — S52602A Unspecified fracture of lower end of left ulna, initial encounter for closed fracture: Secondary | ICD-10-CM

## 2018-08-07 DIAGNOSIS — S6992XA Unspecified injury of left wrist, hand and finger(s), initial encounter: Secondary | ICD-10-CM | POA: Diagnosis present

## 2018-08-07 NOTE — ED Provider Notes (Signed)
[facility] EMERGENCY DEPARTMENT Provider Note   CSN: [id number] Arrival date & time: 08/07/18  2232     History   Chief Complaint Chief Complaint  Patient presents with  . Fall    HPI Logan West is a 84 y.o. male with history of persistent atrial fibrillation, COPD, CHF, hypertension, hyperlipidemia presents for evaluation of acute onset, progressively worsening left wrist pain and left upper extremity ecchymosis.  He states that 2 days ago he was attempting to transfer from his sink onto his toilet but tripped on a chair that is in his shower.  He states he landed on his right side, denies head injury or loss of consciousness.  Since then he has had aching pain to the left wrist which only occurs with palpation.  He does note worsening ecchymosis extending from the left hand up to the left forearm.  Has not tried anything for his pain.  Just prior to arrival he was attempting to walk from his driveway to his daughter's vehicle so that he could come to the ED for evaluation of his symptoms when he tripped over a brick and landed on his right hand.  Again denies head injury or loss of consciousness.  Presently denies headache, neck pain, back pain, chest pain, abdominal pain, nausea, or vomiting.  He notes shortness of breath at baseline due to his COPD but states it is no worse than usual.  Denies fever cough.  He is on 3 L/min at home at all times.  Notes that he does have a walker but does not use it.  He states that he is "supposed to be on a blood thinner "but does not take it.  Found to be hypoxic on room air via EMS at 89% and was given a breathing treatment and put on oxygen with improvement.  The history is provided by the patient.    Past Medical History:  Diagnosis Date  . Atrial fibrillation (HCC)   . CHF (congestive heart failure) (HCC)   . Chronic pain   . COPD (chronic obstructive pulmonary disease) (HCC)   . Hyperlipidemia    x 7-8  . Hypertension    x30 years  .  Obesity     Patient Active Problem List   Diagnosis Date Noted  . DNR (do not resuscitate) 12/23/2017  . Anemia, unspecified 12/23/2017  . Hyperglycemia 12/23/2017  . Prediabetes 04/07/2016  . Hypokalemia 11/30/2015  . Respiratory failure (HCC) 11/28/2015  . COPD mixed type (HCC) 09/21/2015  . Hematuria 09/01/2015  . Chronic atrial fibrillation (HCC) 08/29/2015  . Chronic combined systolic and diastolic congestive heart failure (HCC) 08/01/2015  . Chronic pain syndrome 05/28/2013  . HLD (hyperlipidemia) 05/28/2013  . Essential hypertension 03/16/2011  . Anxiety 03/16/2011  . Obesity 03/16/2011    Past Surgical History:  Procedure Laterality Date  . FRACTURE SURGERY     right forearm  . PERCUTANEOUS PINNING     right forearm fracture        Home Medications    Prior to Admission medications   Medication Sig Start Date End Date Taking? Authorizing Provider  albuterol (PROVENTIL) (2.5 MG/3ML) 0.083% nebulizer solution Take 3 mLs (2.5 mg total) by nebulization every 6 (six) hours as needed for wheezing or shortness of breath. 12/27/17  Yes Erick Blinks, MD  [facility] ASPIRIN LOW STRENGTH 81 MG EC tablet TAKE 1 TABLET BY MOUTH EVERY DAY Patient taking differently: Take 81 mg by mouth daily.  06/20/18  Yes Mechele Claude, MD  diltiazem (CARDIZEM CD) 120 MG 24 hr capsule TAKE 1 CAPSULE BY MOUTH EVERY DAY Patient taking differently: Take 120 mg by mouth daily.  05/16/18  Yes Stacks, Broadus John, MD  furosemide (LASIX) 20 MG tablet Take 1 tablet (20 mg total) by mouth every other day. 01/03/18  Yes Stacks, Broadus John, MD  LORazepam (ATIVAN) 1 MG tablet Take 1 tablet (1 mg total) by mouth every 8 (eight) hours as needed for anxiety. 07/03/18  Yes Mechele Claude, MD  metoprolol succinate (TOPROL-XL) 25 MG 24 hr tablet Take 1 tablet (25 mg total) by mouth daily. 07/03/18  Yes Stacks, Broadus John, MD  nitroGLYCERIN (NITROSTAT) 0.4 MG SL tablet Place 1 tablet (0.4 mg total) under the tongue every 5 (five)  minutes as needed for chest pain. 08/09/17 08/07/18 Yes Rollene Rotunda, MD  OXYGEN Inhale 3 L into the lungs continuous.   Yes [provider]  pravastatin (PRAVACHOL) 40 MG tablet TAKE 1 TABLET (40 MG TOTAL) BY MOUTH DAILY. Patient taking differently: Take 40 mg by mouth daily.  08/09/17  Yes Rollene Rotunda, MD  tamsulosin (FLOMAX) 0.4 MG CAPS capsule Take 2 capsules (0.8 mg total) by mouth daily after supper. 04/04/18  Yes Stacks, Broadus John, MD  traMADol (ULTRAM) 50 MG tablet Take 1 tablet (50 mg total) by mouth 2 (two) times daily. 07/03/18  Yes Mechele Claude, MD    Family History Family History  Problem Relation Age of Onset  . Cancer Mother   . Asthma Father   . Tuberculosis Father   . Heart attack Brother        same mother.  MI in his 61s  . Coronary artery disease Other   . COPD Daughter   . Thyroid disease Daughter     Social History Social History   Tobacco Use  . Smoking status: Former Smoker    Packs/day: 2.50    Years: 25.00    Pack years: 62.50    Types: Cigarettes    Last attempt to quit: 02/23/1991    Years since quitting: 27.4  . Smokeless tobacco: Never Used  Substance Use Topics  . Alcohol use: No    Alcohol/week: 0.0 standard drinks  . Drug use: No     Allergies   Patient has no known allergies.   Review of Systems Review of Systems  Constitutional: Negative for fever.  Respiratory: Positive for shortness of breath (chronic, unchanged).   Gastrointestinal: Negative for abdominal pain, nausea and vomiting.  Musculoskeletal: Positive for arthralgias.  Skin: Positive for color change.  Neurological: Negative for syncope, numbness and headaches.  All other systems reviewed and are negative.    Physical Exam Updated Vital Signs BP 130/67   Pulse (!) 118   Temp 98.2 F (36.8 C) (Oral)   Resp 15   SpO2 100%   Physical Exam  Constitutional: He appears well-developed and well-nourished. No distress.  HENT:  Head: Normocephalic and  atraumatic.  No Battle's signs, no raccoon's eyes, no rhinorrhea. No hemotympanum. No tenderness to palpation of the face or skull. No deformity, crepitus, or swelling noted.   Eyes: Pupils are equal, round, and reactive to light. Conjunctivae and EOM are normal. Right eye exhibits no discharge. Left eye exhibits no discharge.  Neck: Normal range of motion. Neck supple. No JVD present. No tracheal deviation present.  Cardiovascular: Intact distal pulses.  Tachycardic, 2+ radial and DP/PT pulses bilaterally.  Pulmonary/Chest: Effort normal.  Globally diminished breath sounds, scattered wheezes.  Speaking in full sentences without difficulty.  No  tenderness to palpation of the chest wall with no crepitus, ecchymosis, or flail segment noted.  Abdominal: Soft. Bowel sounds are normal. He exhibits no distension. There is no tenderness. There is no guarding.  Musculoskeletal: He exhibits no edema.  No midline spine TTP, no paraspinal muscle tenderness, no deformity, crepitus, or step-off noted.  Diffuse ecchymosis noted to the left upper extremity primarily along the forearm.  There is some swelling around the left wrist and he has tenderness to palpation overlying the distal radius and ulna.  No tenderness to palpation of the left elbow.  There is deformity of the right fifth digit which patient states is baseline secondary to injury several years ago which required repair with pins.  Diffuse tenderness to palpation of this digit with no underlying crepitus.  Mild swelling and ecchymosis of the digit noted.  5/5 strength of bilateral upper extremity major muscle groups including wrist and digits with flexion and extension against resistance.  Good grip strength bilaterally.  5/5 strength of bilateral lower extremity major muscle groups with no deformity, crepitus, ecchymosis, or flail segment.  No pelvic instability palpated.  Neurological: He is alert. No cranial nerve deficit or sensory deficit. He exhibits  normal muscle tone.  Fluent speech with no evidence of dysarthria or aphasia.  No facial droop.  Sensation intact to soft touch of extremities.  Cranial nerves II through XII tested and intact.  Skin: Skin is warm and dry. No erythema.  Psychiatric: He has a normal mood and affect. His behavior is normal.  Nursing note and vitals reviewed.    ED Treatments / Results  Labs (all labs ordered are listed, but only abnormal results are displayed) Labs Reviewed  URINALYSIS, ROUTINE W REFLEX MICROSCOPIC - Abnormal; Notable for the following components:      Result Value   Color, Urine AMBER (*)    Protein, ur 30 (*)    All other components within normal limits    EKG None  Radiology Dg Elbow Complete Left  Result Date: 08/07/2018 CLINICAL DATA:  Initial evaluation for acute trauma, fall. EXAM: LEFT ELBOW - COMPLETE 3+ VIEW COMPARISON:  None. FINDINGS: No acute fracture or dislocation. No joint effusion. Radial head intact. Bones are diffusely osteopenic. No acute soft tissue abnormality. IMPRESSION: No acute osseous abnormality about the left elbow. Electronically Signed   By: Rise MuBenjamin  McClintock M.D.   On: 08/07/2018 23:47   Dg Forearm Left  Result Date: 08/07/2018 CLINICAL DATA:  Initial evaluation for acute trauma, fall. EXAM: LEFT FOREARM - 2 VIEW COMPARISON:  None. FINDINGS: Acute comminuted displaced fractures of the distal left radius and ulna, better evaluated on concomitant radiograph of the left wrist. No other acute fracture about the forearm. Limited views of the elbow grossly unremarkable. Diffuse osteopenia. Soft tissue swelling about the wrist. IMPRESSION: 1. Acute comminuted fractures of the distal left radius and ulna, better evaluated on concomitant radiograph the left wrist. 2. No other acute osseous abnormality about the forearm. Electronically Signed   By: Rise MuBenjamin  McClintock M.D.   On: 08/07/2018 23:43   Dg Wrist Complete Left  Result Date: 08/07/2018 CLINICAL DATA:   Initial evaluation for acute trauma, fall. EXAM: LEFT WRIST - COMPLETE 3+ VIEW COMPARISON:  None. FINDINGS: Acute comminuted fracture of the distal left radius with intra-articular extension, dorsal angulation, and mild impaction. Additional acute mildly comminuted fracture through the distal left ulna with fracture through the base of the ulnar styloid. Normal radiocarpal and distal radioulnar articulations preserved. Ulnar positive variance  noted. Visualized hand intact. Osteopenia. Diffuse soft tissue swelling about the wrist. IMPRESSION: Acute comminuted fractures of the distal left radius and ulna with intra-articular extension as above. Electronically Signed   By: Rise Mu M.D.   On: 08/07/2018 23:45   Dg Hand Complete Right  Result Date: 08/07/2018 CLINICAL DATA:  Initial evaluation for acute trauma, fall. EXAM: RIGHT HAND - COMPLETE 3+ VIEW COMPARISON:  None. FINDINGS: There is an acute transverse fracture through the base of the right fifth proximal phalanx. Associated mild ulnar angulation. No intra-articular extension. Soft tissue swelling present at the right fifth digit. No other acute fracture or dislocation. Diffuse osteopenia. Scattered osteoarthritic changes noted about the hand and wrist. IMPRESSION: Acute transverse fracture through the base of the right fifth proximal phalanx. Electronically Signed   By: Rise Mu M.D.   On: 08/07/2018 23:50    Procedures Procedures (including critical care time)  Medications Ordered in ED Medications  diltiazem (CARDIZEM) tablet 60 mg (60 mg Oral Given 08/08/18 0042)     Initial Impression / Assessment and Plan / ED Course  I have reviewed the triage vital signs and the nursing notes.  Pertinent labs & imaging results that were available during my care of the patient were reviewed by me and considered in my medical decision making (see chart for details).     Patient presents for evaluation of left forearm pain and  ecchymosis and right fifth digit pain and ecchymosis status post mechanical fall 2 days ago and just prior to arrival.  He is afebrile, somewhat tachycardic in the ED but has a history of persistent A. fib and is at times this  tachycardic.  He denies chest pain or shortness of breath and there was no prodrome leading up to the falls.  He was given an additional dose of Cardizem in the ED for his tachycardia.  Daughter notes that there have been some medication adjustments recently and I encouraged her to monitor his heart rate at home and if it remains persistently tachycardic to follow-up with his cardiologist for medication management.   The patient is neurovascularly intact.  Compartments are soft.  No signs of secondary skin infection.  No concern for closed head injury, no midline spine tenderness.  No tenderness to palpation of the chest or abdomen and I doubt acute cardiopulmonary abnormality, rib fractures, or acute intra-abdominal injury.  He was ambulated in the ED without difficulty.  He has advanced COPD and becomes short of breath with little exertion.  He notes no change in his chronic shortness of breath.  He is on 3 L/min O2 at home which remained stable while in the ED.  UA shows no evidence of UTI or nephrolithiasis.  Radiographs show acute comminuted fractures of the distal left radius and ulna with intra-articular extension and acute transverse fracture through the base of the right fifth proximal phalanx.  He was placed in the left sugar tong splint and his right finger was placed in a splint and buddy taped.  He has tramadol at home for pain. Discussed RICE therapy.  He understands to follow-up with orthopedics for reevaluation of his symptoms.  Patient's daughter requests home health for the patient which I think is quite reasonable.  Consult placed.  Discussed strict ED return precautions.  Patient and patient's daughter verbalized understanding of and agreement with plan and patient is  stable for discharge home at this time.  Patient was seen and evaluated by Dr. Lynelle Doctor who agrees with assessment  and plan at this time.   Final Clinical Impressions(s) / ED Diagnoses   Final diagnoses:  Closed fracture of distal end of left radius, unspecified fracture morphology, initial encounter  Closed fracture of distal end of left ulna, unspecified fracture morphology, initial encounter  Closed nondisplaced fracture of proximal phalanx of right little finger, initial encounter  Fall, initial encounter  Tachycardia    ED Discharge Orders         Ordered    Home Health    Comments:  Patient's daughter Nainoa Woldt requests to be contacted for update. Can be reached at [phone]   08/08/18 0054    Face-to-face encounter (required for Medicare/Medicaid patients)    Comments:  I Jeanie Sewer certify that this patient is under my care and that I, or a nurse practitioner or physician's assistant working with me, had a face-to-face encounter that meets the physician face-to-face encounter requirements with this patient on 08/08/2018. The encounter with the patient was in whole, or in part for the following medical condition(s) which is the primary reason for home health care (List medical condition): COPD, left distal radius and ulna fractures   08/08/18 0054           Jeanie Sewer, PA-C 08/08/18 0981    Devoria Albe, MD 08/08/18 [phone]

## 2018-08-07 NOTE — ED Provider Notes (Signed)
Patient has COPD on home oxygen.  He states he fell yesterday and hurt his left wrist.  He states he was in the bathroom and he turned quickly to go use the toilet and he tripped and fell.  He is noted to have some deformity of his left wrist with a lot of bruising of his left forearm.  He denies being on a blood thinner.  He also complains of some pain tonight, on the way to the ED he tripped and fell again and injured his right little finger.  Patient has had surgery on his right hand before and has had pins before. He has some ulnar deviation of his RLF.      Devoria AlbeKnapp, Coty Larsh, MD 08/08/18 0001

## 2018-08-07 NOTE — ED Triage Notes (Signed)
Pt brought from home via [facility]. Pt C/O fall that occurred 2 days ago and injured his left wrist/forearm. Pt states that he tripped and fell again tonight injuring the left arm again and also the right pinky finger. Pt denies any LOC or dizziness. Pt did receive 1 albuterol Tx in route for wheezing and SOB.

## 2018-08-08 LAB — URINALYSIS, ROUTINE W REFLEX MICROSCOPIC
BILIRUBIN URINE: NEGATIVE
Bacteria, UA: NONE SEEN
GLUCOSE, UA: NEGATIVE mg/dL
Hgb urine dipstick: NEGATIVE
KETONES UR: NEGATIVE mg/dL
LEUKOCYTES UA: NEGATIVE
NITRITE: NEGATIVE
PROTEIN: 30 mg/dL — AB
Specific Gravity, Urine: 1.027 (ref 1.005–1.030)
pH: 6 (ref 5.0–8.0)

## 2018-08-08 MED ORDER — DILTIAZEM HCL 30 MG PO TABS
60.0000 mg | ORAL_TABLET | Freq: Once | ORAL | Status: AC
  Administered 2018-08-08: 60 mg via ORAL
  Filled 2018-08-08: qty 2

## 2018-08-08 NOTE — ED Notes (Signed)
While ambulating PT. Was slightly unsteady but was able to walk with an O2 tank with no assistance.

## 2018-08-08 NOTE — Discharge Instructions (Addendum)
Keep your splint clean and dry.  Wear the splint at all times.  You can take 650 mg of Tylenol every 6 hours as needed for pain.  You can take your home tramadol as prescribed as needed for pain but be aware this medication may make you drowsy.   Keep your arm elevated and apply ice for 20 minutes at a time 2-3 times daily as needed for pain and swelling.  Follow-up with Dr. Romeo AppleHarrison in [place] or Dr. Amanda PeaGramig in [place] for reevaluation of your left wrist fracture and right pinky fracture.  Your heart rate was a little bit fast today.  Continue to take all of your home medicines as prescribed.  If you notice that your heart rate remains elevated, follow-up with your cardiologist for reevaluation.  Return to the emergency department if any concerning signs or symptoms develop such as fevers, shortness of breath, chest pain, severe swelling of the arm or hand, or redness of the skin.

## 2018-08-09 DIAGNOSIS — R404 Transient alteration of awareness: Secondary | ICD-10-CM | POA: Diagnosis not present

## 2018-08-09 DIAGNOSIS — W19XXXA Unspecified fall, initial encounter: Secondary | ICD-10-CM | POA: Diagnosis not present

## 2018-08-13 ENCOUNTER — Other Ambulatory Visit: Payer: Self-pay | Admitting: Cardiology

## 2018-08-21 ENCOUNTER — Ambulatory Visit: Payer: Medicare Other | Admitting: Family Medicine

## 2018-08-21 DIAGNOSIS — 419620001 Death: Secondary | SNOMED CT | POA: Diagnosis not present

## 2018-08-21 DEATH — deceased
